# Patient Record
Sex: Female | Born: 1965
Health system: Southern US, Community
[De-identification: ages and names within clinical notes are randomized; demographics above are authoritative.]

## PROBLEM LIST (undated history)

## (undated) DIAGNOSIS — K219 Gastro-esophageal reflux disease without esophagitis: Secondary | ICD-10-CM

## (undated) DIAGNOSIS — IMO0002 Reserved for concepts with insufficient information to code with codable children: Secondary | ICD-10-CM

## (undated) DIAGNOSIS — I10 Essential (primary) hypertension: Secondary | ICD-10-CM

## (undated) HISTORY — PX: ABDOMINAL SURGERY: SHX537

## (undated) HISTORY — PX: CHOLECYSTECTOMY: SHX55

## (undated) HISTORY — PX: PARTIAL HYSTERECTOMY: SHX80

## (undated) HISTORY — PX: APPENDECTOMY: SHX54

## (undated) HISTORY — PX: DILATION AND CURETTAGE OF UTERUS: SHX78

## (undated) HISTORY — PX: NECK SURGERY: SHX720

---

## 2000-04-19 ENCOUNTER — Ambulatory Visit (HOSPITAL_COMMUNITY): Admission: RE | Admit: 2000-04-19 | Discharge: 2000-04-19 | Payer: Self-pay | Admitting: Family Medicine

## 2000-04-19 ENCOUNTER — Encounter: Payer: Self-pay | Admitting: Family Medicine

## 2000-04-20 ENCOUNTER — Encounter (HOSPITAL_COMMUNITY): Admission: RE | Admit: 2000-04-20 | Discharge: 2000-05-20 | Payer: Self-pay | Admitting: Family Medicine

## 2000-05-09 ENCOUNTER — Encounter: Payer: Self-pay | Admitting: Family Medicine

## 2000-05-09 ENCOUNTER — Ambulatory Visit (HOSPITAL_COMMUNITY): Admission: RE | Admit: 2000-05-09 | Discharge: 2000-05-09 | Payer: Self-pay | Admitting: Family Medicine

## 2000-06-20 ENCOUNTER — Observation Stay (HOSPITAL_COMMUNITY): Admission: RE | Admit: 2000-06-20 | Discharge: 2000-06-21 | Payer: Self-pay | Admitting: Neurosurgery

## 2000-07-20 ENCOUNTER — Encounter: Admission: RE | Admit: 2000-07-20 | Discharge: 2000-07-20 | Payer: Self-pay | Admitting: Neurosurgery

## 2000-09-30 ENCOUNTER — Encounter: Admission: RE | Admit: 2000-09-30 | Discharge: 2000-09-30 | Payer: Self-pay | Admitting: Neurosurgery

## 2000-10-11 ENCOUNTER — Encounter (HOSPITAL_COMMUNITY): Admission: RE | Admit: 2000-10-11 | Discharge: 2000-11-10 | Payer: Self-pay | Admitting: Neurosurgery

## 2000-11-09 ENCOUNTER — Ambulatory Visit (HOSPITAL_COMMUNITY): Admission: RE | Admit: 2000-11-09 | Discharge: 2000-11-09 | Payer: Self-pay | Admitting: Neurosurgery

## 2000-11-11 ENCOUNTER — Encounter: Admission: RE | Admit: 2000-11-11 | Discharge: 2000-11-11 | Payer: Self-pay | Admitting: Neurosurgery

## 2000-11-21 ENCOUNTER — Encounter: Admission: RE | Admit: 2000-11-21 | Discharge: 2001-02-19 | Payer: Self-pay | Admitting: Anesthesiology

## 2001-01-10 ENCOUNTER — Encounter: Admission: RE | Admit: 2001-01-10 | Discharge: 2001-02-01 | Payer: Self-pay | Admitting: *Deleted

## 2001-03-03 ENCOUNTER — Encounter: Admission: RE | Admit: 2001-03-03 | Discharge: 2001-06-01 | Payer: Self-pay | Admitting: Anesthesiology

## 2001-06-06 ENCOUNTER — Encounter: Admission: RE | Admit: 2001-06-06 | Discharge: 2001-09-04 | Payer: Self-pay

## 2001-09-25 ENCOUNTER — Encounter: Admission: RE | Admit: 2001-09-25 | Discharge: 2001-10-03 | Payer: Self-pay | Admitting: Anesthesiology

## 2001-10-09 ENCOUNTER — Encounter: Payer: Self-pay | Admitting: Family Medicine

## 2001-10-09 ENCOUNTER — Ambulatory Visit (HOSPITAL_COMMUNITY): Admission: RE | Admit: 2001-10-09 | Discharge: 2001-10-09 | Payer: Self-pay | Admitting: Family Medicine

## 2001-10-20 ENCOUNTER — Encounter: Admission: RE | Admit: 2001-10-20 | Discharge: 2002-01-18 | Payer: Self-pay

## 2001-12-25 ENCOUNTER — Ambulatory Visit (HOSPITAL_COMMUNITY): Admission: RE | Admit: 2001-12-25 | Discharge: 2001-12-25 | Payer: Self-pay

## 2002-01-08 ENCOUNTER — Encounter (HOSPITAL_COMMUNITY): Admission: RE | Admit: 2002-01-08 | Discharge: 2002-02-07 | Payer: Self-pay

## 2002-02-02 ENCOUNTER — Ambulatory Visit (HOSPITAL_COMMUNITY): Admission: RE | Admit: 2002-02-02 | Discharge: 2002-02-02 | Payer: Self-pay | Admitting: Neurosurgery

## 2002-02-09 ENCOUNTER — Encounter: Admission: RE | Admit: 2002-02-09 | Discharge: 2002-02-09 | Payer: Self-pay | Admitting: Neurosurgery

## 2002-02-09 ENCOUNTER — Encounter: Admission: RE | Admit: 2002-02-09 | Discharge: 2002-05-10 | Payer: Self-pay

## 2002-05-10 ENCOUNTER — Encounter
Admission: RE | Admit: 2002-05-10 | Discharge: 2002-08-08 | Payer: Self-pay | Admitting: Physical Medicine & Rehabilitation

## 2002-05-10 ENCOUNTER — Encounter (HOSPITAL_COMMUNITY)
Admission: RE | Admit: 2002-05-10 | Discharge: 2002-06-09 | Payer: Self-pay | Admitting: Physical Medicine & Rehabilitation

## 2002-07-31 ENCOUNTER — Encounter
Admission: RE | Admit: 2002-07-31 | Discharge: 2002-08-07 | Payer: Self-pay | Admitting: Physical Medicine & Rehabilitation

## 2002-08-13 ENCOUNTER — Encounter
Admission: RE | Admit: 2002-08-13 | Discharge: 2002-11-11 | Payer: Self-pay | Admitting: Physical Medicine & Rehabilitation

## 2002-10-29 ENCOUNTER — Encounter
Admission: RE | Admit: 2002-10-29 | Discharge: 2002-10-29 | Payer: Self-pay | Admitting: Physical Medicine & Rehabilitation

## 2002-11-07 ENCOUNTER — Encounter
Admission: RE | Admit: 2002-11-07 | Discharge: 2002-11-27 | Payer: Self-pay | Admitting: Physical Medicine & Rehabilitation

## 2002-11-23 ENCOUNTER — Encounter
Admission: RE | Admit: 2002-11-23 | Discharge: 2003-02-21 | Payer: Self-pay | Admitting: Physical Medicine & Rehabilitation

## 2003-03-10 ENCOUNTER — Emergency Department (HOSPITAL_COMMUNITY): Admission: EM | Admit: 2003-03-10 | Discharge: 2003-03-10 | Payer: Self-pay | Admitting: Emergency Medicine

## 2003-03-13 ENCOUNTER — Inpatient Hospital Stay (HOSPITAL_COMMUNITY): Admission: EM | Admit: 2003-03-13 | Discharge: 2003-03-15 | Payer: Self-pay | Admitting: Emergency Medicine

## 2003-04-05 ENCOUNTER — Encounter
Admission: RE | Admit: 2003-04-05 | Discharge: 2003-07-04 | Payer: Self-pay | Admitting: Physical Medicine & Rehabilitation

## 2003-08-06 ENCOUNTER — Encounter
Admission: RE | Admit: 2003-08-06 | Discharge: 2003-09-12 | Payer: Self-pay | Admitting: Physical Medicine & Rehabilitation

## 2003-09-12 ENCOUNTER — Encounter
Admission: RE | Admit: 2003-09-12 | Discharge: 2003-10-30 | Payer: Self-pay | Admitting: Physical Medicine & Rehabilitation

## 2003-09-16 ENCOUNTER — Ambulatory Visit: Payer: Self-pay | Admitting: Physical Medicine & Rehabilitation

## 2003-10-08 ENCOUNTER — Ambulatory Visit (HOSPITAL_COMMUNITY): Admission: RE | Admit: 2003-10-08 | Discharge: 2003-10-08 | Payer: Self-pay | Admitting: Obstetrics & Gynecology

## 2003-10-30 ENCOUNTER — Encounter
Admission: RE | Admit: 2003-10-30 | Discharge: 2004-01-16 | Payer: Self-pay | Admitting: Physical Medicine & Rehabilitation

## 2003-11-01 ENCOUNTER — Ambulatory Visit: Payer: Self-pay | Admitting: Physical Medicine & Rehabilitation

## 2004-01-16 ENCOUNTER — Encounter
Admission: RE | Admit: 2004-01-16 | Discharge: 2004-04-15 | Payer: Self-pay | Admitting: Physical Medicine & Rehabilitation

## 2004-01-20 ENCOUNTER — Ambulatory Visit: Payer: Self-pay | Admitting: Physical Medicine & Rehabilitation

## 2004-03-30 ENCOUNTER — Ambulatory Visit: Payer: Self-pay | Admitting: Physical Medicine & Rehabilitation

## 2004-04-15 ENCOUNTER — Encounter
Admission: RE | Admit: 2004-04-15 | Discharge: 2004-07-14 | Payer: Self-pay | Admitting: Physical Medicine & Rehabilitation

## 2004-05-25 ENCOUNTER — Ambulatory Visit: Payer: Self-pay | Admitting: Physical Medicine & Rehabilitation

## 2004-07-22 ENCOUNTER — Encounter
Admission: RE | Admit: 2004-07-22 | Discharge: 2004-10-20 | Payer: Self-pay | Admitting: Physical Medicine & Rehabilitation

## 2004-07-22 ENCOUNTER — Ambulatory Visit: Payer: Self-pay | Admitting: Physical Medicine & Rehabilitation

## 2004-09-23 ENCOUNTER — Ambulatory Visit: Payer: Self-pay | Admitting: Physical Medicine & Rehabilitation

## 2004-10-09 ENCOUNTER — Ambulatory Visit (HOSPITAL_COMMUNITY): Admission: RE | Admit: 2004-10-09 | Discharge: 2004-10-09 | Payer: Self-pay | Admitting: Obstetrics & Gynecology

## 2004-10-19 ENCOUNTER — Encounter
Admission: RE | Admit: 2004-10-19 | Discharge: 2005-01-17 | Payer: Self-pay | Admitting: Physical Medicine & Rehabilitation

## 2004-12-07 ENCOUNTER — Ambulatory Visit: Payer: Self-pay | Admitting: Physical Medicine & Rehabilitation

## 2005-01-27 ENCOUNTER — Encounter
Admission: RE | Admit: 2005-01-27 | Discharge: 2005-04-27 | Payer: Self-pay | Admitting: Physical Medicine & Rehabilitation

## 2005-02-25 ENCOUNTER — Ambulatory Visit: Payer: Self-pay | Admitting: Physical Medicine & Rehabilitation

## 2005-02-27 ENCOUNTER — Emergency Department (HOSPITAL_COMMUNITY): Admission: EM | Admit: 2005-02-27 | Discharge: 2005-02-27 | Payer: Self-pay | Admitting: Emergency Medicine

## 2005-04-14 ENCOUNTER — Ambulatory Visit (HOSPITAL_COMMUNITY): Admission: RE | Admit: 2005-04-14 | Discharge: 2005-04-14 | Payer: Self-pay | Admitting: Family Medicine

## 2005-04-22 ENCOUNTER — Ambulatory Visit: Payer: Self-pay | Admitting: Physical Medicine & Rehabilitation

## 2005-05-19 ENCOUNTER — Encounter
Admission: RE | Admit: 2005-05-19 | Discharge: 2005-08-17 | Payer: Self-pay | Admitting: Physical Medicine & Rehabilitation

## 2005-06-16 ENCOUNTER — Ambulatory Visit: Payer: Self-pay | Admitting: Physical Medicine & Rehabilitation

## 2005-08-30 ENCOUNTER — Encounter
Admission: RE | Admit: 2005-08-30 | Discharge: 2005-11-28 | Payer: Self-pay | Admitting: Physical Medicine & Rehabilitation

## 2005-08-30 ENCOUNTER — Ambulatory Visit: Payer: Self-pay | Admitting: Physical Medicine & Rehabilitation

## 2005-09-21 ENCOUNTER — Emergency Department (HOSPITAL_COMMUNITY): Admission: EM | Admit: 2005-09-21 | Discharge: 2005-09-22 | Payer: Self-pay | Admitting: Emergency Medicine

## 2005-10-04 ENCOUNTER — Ambulatory Visit (HOSPITAL_COMMUNITY)
Admission: RE | Admit: 2005-10-04 | Discharge: 2005-10-04 | Payer: Self-pay | Admitting: Physical Medicine & Rehabilitation

## 2005-10-15 ENCOUNTER — Ambulatory Visit (HOSPITAL_COMMUNITY): Admission: RE | Admit: 2005-10-15 | Discharge: 2005-10-15 | Payer: Self-pay | Admitting: Obstetrics & Gynecology

## 2005-11-05 ENCOUNTER — Encounter: Admission: RE | Admit: 2005-11-05 | Discharge: 2005-11-05 | Payer: Self-pay | Admitting: Neurosurgery

## 2005-11-22 ENCOUNTER — Ambulatory Visit: Payer: Self-pay | Admitting: Physical Medicine & Rehabilitation

## 2005-12-20 ENCOUNTER — Encounter
Admission: RE | Admit: 2005-12-20 | Discharge: 2006-03-20 | Payer: Self-pay | Admitting: Physical Medicine & Rehabilitation

## 2006-02-11 ENCOUNTER — Encounter
Admission: RE | Admit: 2006-02-11 | Discharge: 2006-05-12 | Payer: Self-pay | Admitting: Physical Medicine & Rehabilitation

## 2006-02-14 ENCOUNTER — Ambulatory Visit: Payer: Self-pay | Admitting: Physical Medicine & Rehabilitation

## 2006-03-15 ENCOUNTER — Emergency Department (HOSPITAL_COMMUNITY): Admission: EM | Admit: 2006-03-15 | Discharge: 2006-03-15 | Payer: Self-pay | Admitting: Emergency Medicine

## 2006-03-16 ENCOUNTER — Encounter (HOSPITAL_COMMUNITY): Admission: RE | Admit: 2006-03-16 | Discharge: 2006-04-15 | Payer: Self-pay | Admitting: Emergency Medicine

## 2006-04-11 ENCOUNTER — Ambulatory Visit: Payer: Self-pay | Admitting: Physical Medicine & Rehabilitation

## 2006-06-06 ENCOUNTER — Encounter
Admission: RE | Admit: 2006-06-06 | Discharge: 2006-09-04 | Payer: Self-pay | Admitting: Physical Medicine & Rehabilitation

## 2006-06-09 ENCOUNTER — Ambulatory Visit: Payer: Self-pay | Admitting: Physical Medicine & Rehabilitation

## 2006-09-07 ENCOUNTER — Encounter
Admission: RE | Admit: 2006-09-07 | Discharge: 2006-12-06 | Payer: Self-pay | Admitting: Physical Medicine & Rehabilitation

## 2006-09-08 ENCOUNTER — Ambulatory Visit: Payer: Self-pay | Admitting: Physical Medicine & Rehabilitation

## 2006-10-18 ENCOUNTER — Ambulatory Visit (HOSPITAL_COMMUNITY): Admission: RE | Admit: 2006-10-18 | Discharge: 2006-10-18 | Payer: Self-pay | Admitting: Obstetrics & Gynecology

## 2006-11-03 ENCOUNTER — Ambulatory Visit: Payer: Self-pay | Admitting: Physical Medicine & Rehabilitation

## 2006-11-04 ENCOUNTER — Ambulatory Visit (HOSPITAL_COMMUNITY)
Admission: RE | Admit: 2006-11-04 | Discharge: 2006-11-04 | Payer: Self-pay | Admitting: Physical Medicine & Rehabilitation

## 2006-11-08 ENCOUNTER — Other Ambulatory Visit: Admission: RE | Admit: 2006-11-08 | Discharge: 2006-11-08 | Payer: Self-pay | Admitting: Obstetrics & Gynecology

## 2006-12-27 ENCOUNTER — Encounter
Admission: RE | Admit: 2006-12-27 | Discharge: 2007-03-27 | Payer: Self-pay | Admitting: Physical Medicine & Rehabilitation

## 2006-12-27 ENCOUNTER — Ambulatory Visit: Payer: Self-pay | Admitting: Physical Medicine & Rehabilitation

## 2007-02-27 ENCOUNTER — Ambulatory Visit: Payer: Self-pay | Admitting: Physical Medicine & Rehabilitation

## 2007-02-27 ENCOUNTER — Encounter
Admission: RE | Admit: 2007-02-27 | Discharge: 2007-05-28 | Payer: Self-pay | Admitting: Physical Medicine & Rehabilitation

## 2007-04-18 ENCOUNTER — Ambulatory Visit (HOSPITAL_COMMUNITY)
Admission: RE | Admit: 2007-04-18 | Discharge: 2007-04-18 | Payer: Self-pay | Admitting: Physical Medicine & Rehabilitation

## 2007-04-18 ENCOUNTER — Ambulatory Visit: Payer: Self-pay | Admitting: Physical Medicine & Rehabilitation

## 2007-05-16 ENCOUNTER — Ambulatory Visit: Payer: Self-pay | Admitting: Physical Medicine & Rehabilitation

## 2007-06-05 ENCOUNTER — Ambulatory Visit (HOSPITAL_COMMUNITY): Admission: RE | Admit: 2007-06-05 | Discharge: 2007-06-05 | Payer: Self-pay | Admitting: Internal Medicine

## 2007-06-14 ENCOUNTER — Encounter
Admission: RE | Admit: 2007-06-14 | Discharge: 2007-09-12 | Payer: Self-pay | Admitting: Physical Medicine & Rehabilitation

## 2007-06-15 ENCOUNTER — Ambulatory Visit: Payer: Self-pay | Admitting: Physical Medicine & Rehabilitation

## 2007-07-12 ENCOUNTER — Inpatient Hospital Stay (HOSPITAL_COMMUNITY): Admission: RE | Admit: 2007-07-12 | Discharge: 2007-07-13 | Payer: Self-pay | Admitting: Neurosurgery

## 2007-07-25 ENCOUNTER — Ambulatory Visit: Payer: Self-pay | Admitting: Physical Medicine & Rehabilitation

## 2007-08-22 ENCOUNTER — Ambulatory Visit: Payer: Self-pay | Admitting: Physical Medicine & Rehabilitation

## 2007-09-18 ENCOUNTER — Encounter
Admission: RE | Admit: 2007-09-18 | Discharge: 2007-12-17 | Payer: Self-pay | Admitting: Physical Medicine & Rehabilitation

## 2007-09-19 ENCOUNTER — Ambulatory Visit: Payer: Self-pay | Admitting: Physical Medicine & Rehabilitation

## 2007-10-17 ENCOUNTER — Ambulatory Visit: Payer: Self-pay | Admitting: Physical Medicine & Rehabilitation

## 2007-10-20 ENCOUNTER — Ambulatory Visit (HOSPITAL_COMMUNITY): Admission: RE | Admit: 2007-10-20 | Discharge: 2007-10-20 | Payer: Self-pay | Admitting: Obstetrics & Gynecology

## 2007-11-09 ENCOUNTER — Other Ambulatory Visit: Admission: RE | Admit: 2007-11-09 | Discharge: 2007-11-09 | Payer: Self-pay | Admitting: Obstetrics & Gynecology

## 2007-11-14 ENCOUNTER — Ambulatory Visit: Payer: Self-pay | Admitting: Physical Medicine & Rehabilitation

## 2008-02-02 ENCOUNTER — Ambulatory Visit (HOSPITAL_COMMUNITY): Admission: RE | Admit: 2008-02-02 | Discharge: 2008-02-02 | Payer: Self-pay | Admitting: Neurosurgery

## 2008-04-24 ENCOUNTER — Ambulatory Visit (HOSPITAL_COMMUNITY): Payer: Self-pay | Admitting: Oncology

## 2008-04-24 ENCOUNTER — Encounter (HOSPITAL_COMMUNITY): Admission: RE | Admit: 2008-04-24 | Discharge: 2008-05-24 | Payer: Self-pay | Admitting: Oncology

## 2008-06-04 ENCOUNTER — Encounter (HOSPITAL_COMMUNITY): Admission: RE | Admit: 2008-06-04 | Discharge: 2008-07-04 | Payer: Self-pay | Admitting: Oncology

## 2008-06-12 ENCOUNTER — Ambulatory Visit (HOSPITAL_COMMUNITY): Admission: RE | Admit: 2008-06-12 | Discharge: 2008-06-12 | Payer: Self-pay | Admitting: Family Medicine

## 2008-06-12 ENCOUNTER — Encounter: Payer: Self-pay | Admitting: Orthopedic Surgery

## 2008-10-29 ENCOUNTER — Ambulatory Visit (HOSPITAL_COMMUNITY): Admission: RE | Admit: 2008-10-29 | Discharge: 2008-10-29 | Payer: Self-pay | Admitting: Obstetrics & Gynecology

## 2008-12-02 ENCOUNTER — Ambulatory Visit (HOSPITAL_COMMUNITY): Admission: RE | Admit: 2008-12-02 | Discharge: 2008-12-02 | Payer: Self-pay | Admitting: General Surgery

## 2008-12-10 ENCOUNTER — Encounter (HOSPITAL_COMMUNITY): Admission: RE | Admit: 2008-12-10 | Discharge: 2008-12-31 | Payer: Self-pay | Admitting: Oncology

## 2008-12-10 ENCOUNTER — Ambulatory Visit (HOSPITAL_COMMUNITY): Payer: Self-pay | Admitting: Oncology

## 2009-06-19 ENCOUNTER — Encounter (HOSPITAL_COMMUNITY): Admission: RE | Admit: 2009-06-19 | Discharge: 2009-07-19 | Payer: Self-pay | Admitting: Oncology

## 2009-06-19 ENCOUNTER — Ambulatory Visit (HOSPITAL_COMMUNITY): Payer: Self-pay | Admitting: Oncology

## 2009-07-13 ENCOUNTER — Emergency Department (HOSPITAL_COMMUNITY): Admission: EM | Admit: 2009-07-13 | Discharge: 2009-07-13 | Payer: Self-pay | Admitting: Emergency Medicine

## 2009-07-13 ENCOUNTER — Encounter: Payer: Self-pay | Admitting: Orthopedic Surgery

## 2009-07-28 ENCOUNTER — Ambulatory Visit: Payer: Self-pay | Admitting: Orthopedic Surgery

## 2009-07-28 DIAGNOSIS — IMO0002 Reserved for concepts with insufficient information to code with codable children: Secondary | ICD-10-CM | POA: Insufficient documentation

## 2009-07-28 DIAGNOSIS — M7580 Other shoulder lesions, unspecified shoulder: Secondary | ICD-10-CM

## 2009-07-30 ENCOUNTER — Encounter: Payer: Self-pay | Admitting: Orthopedic Surgery

## 2009-08-06 ENCOUNTER — Encounter (HOSPITAL_COMMUNITY): Admission: RE | Admit: 2009-08-06 | Discharge: 2009-09-05 | Payer: Self-pay | Admitting: Orthopedic Surgery

## 2009-09-03 ENCOUNTER — Encounter: Payer: Self-pay | Admitting: Orthopedic Surgery

## 2009-09-09 ENCOUNTER — Encounter (HOSPITAL_COMMUNITY): Admission: RE | Admit: 2009-09-09 | Discharge: 2009-10-09 | Payer: Self-pay | Admitting: Orthopedic Surgery

## 2009-10-16 ENCOUNTER — Encounter: Payer: Self-pay | Admitting: Orthopedic Surgery

## 2009-10-30 ENCOUNTER — Ambulatory Visit (HOSPITAL_COMMUNITY): Admission: RE | Admit: 2009-10-30 | Discharge: 2009-10-30 | Payer: Self-pay | Admitting: Obstetrics & Gynecology

## 2010-02-05 NOTE — Miscellaneous (Signed)
Summary: Physical therapy order  Physical therapy order   Imported By: Cammie Sickle 07/29/2009 11:08:56  _____________________________________________________________________  External Attachment:    Type:   Image     Comment:   External Document

## 2010-02-05 NOTE — Letter (Signed)
Summary: History form  History form   Imported By: Jacklynn Ganong 07/31/2009 09:09:02  _____________________________________________________________________  External Attachment:    Type:   Image     Comment:   External Document

## 2010-02-05 NOTE — Miscellaneous (Signed)
Summary: Discharged from Rehab  Discharged from Rehab   Imported By: Jacklynn Ganong 10/16/2009 08:05:55  _____________________________________________________________________  External Attachment:    Type:   Image     Comment:   External Document

## 2010-02-05 NOTE — Assessment & Plan Note (Signed)
Summary: AP [facility] FOL/UP LT SHOULDER SPRAIN/XR [facility] 07/13/09/MEDICAID/[facility]   Vital Signs:  Patient profile:   45 year old female Height:      67 inches Weight:      152 pounds Pulse rate:   64 / minute Resp:     16 per minute  Vitals Entered By: Fuller Canada MD (July 28, 2009 2:10 PM)  Visit Type:  new patient Referring Provider:  ap er Primary Provider:  Dr. Phillips Odor  CC:  left shoulder pain.  History of Present Illness: I saw [name] in the office today for an initial visit.  She is a 45 years old woman with the complaint of:  left shoulder pain.  07/13/09 xrays left shoulder [facility], pain started after lifting box prior to that day.  FYI 06/12/08 MRI C spine for review.  Meds: Prempro.  07/13/09 received Percocet 5, number 20.    Allergies (verified): 1)  ! Clarise Cruz  Past History:  Past Medical History: na  Past Surgical History: neck d and c falopian tubes removed appendix  Family History: Family History of Diabetes Family History Coronary Heart Disease female < 61 Hx, family, asthma  Social History: Patient is widowed.  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  Review of Systems Constitutional:  Denies weight loss, weight gain, fever, chills, and fatigue. Cardiovascular:  Denies chest pain, palpitations, fainting, and murmurs. Respiratory:  Denies short of breath, wheezing, couch, tightness, pain on inspiration, and snoring . Gastrointestinal:  Denies heartburn, nausea, vomiting, diarrhea, constipation, and blood in your stools. Genitourinary:  Denies frequency, urgency, difficulty urinating, painful urination, flank pain, and bleeding in urine. Neurologic:  Denies numbness, tingling, unsteady gait, dizziness, tremors, and seizure. Musculoskeletal:  Denies joint pain, swelling, instability, stiffness, redness, heat, and muscle pain. Endocrine:  Denies excessive thirst, exessive urination, and heat or cold intolerance. Psychiatric:   Denies nervousness, depression, anxiety, and hallucinations. Skin:  Denies changes in the skin, poor healing, rash, itching, and redness. HEENT:  Denies blurred or double vision, eye pain, redness, and watering. Immunology:  Complains of seasonal allergies; denies sinus problems and allergic to bee stings. Hemoatologic:  Denies easy bleeding and brusing.  Physical Exam  Skin:  intact without lesions or rashes Cervical Nodes:  no significant adenopathy Psych:  alert and cooperative; normal mood and affect; normal attention span and concentration   Shoulder/Elbow Exam  General:    Well-developed, well-nourished, normal body habitus; no deformities, normal grooming.    Skin:    Intact, no scars, lesions, rashes, cafe au lait spots or bruising.    Inspection:    Inspection is normal.    Palpation:    tenderness L-deltoid: and tenderness L-posterior subacromial space tenderness L-deltoid: and tenderness L-infrascapular:Marland Kitchen    Vascular:    Radial, ulnar, brachial, and axillary pulses 2+ and symmetric; capillary refill less than 2 seconds; no evidence of ischemia, clubbing, or cyanosis.    Sensory:    Gross sensation intact in the upper extremities.    Motor:    Normal strength in the upper extremities.    Reflexes:    Normal reflexes in the upper extremities.    Shoulder Exam:    Right:    Inspection:  Normal    Palpation:  Normal    Stability:  stable    FROM     Left:    Inspection:  Abnormal    Palpation:  Abnormal    Stability:  stable    Swelling:  no    Erythema:  no    Pain with resisted EXT ROT     Range of Motion:       Flexion-Active: 150       Flexion-Passive: 180       External Rotation : 50       Interior Rotation : T12  Impingement Sign NEER:    Right negative; Left positive Impingement Sign HAWKINS:    Right negative; Left negative Apprehension Sign:    Right negative; Left negative Sulcus Sign:    Right negative; Left negative AC joint  Adduction Test:    Right negative; Left negative   Impression & Recommendations:  Problem # 1:  SHOULDER STRAIN, LEFT (ICD-840.9) Assessment New The x-rays were done at [facility]. The report and the films have been reviewed. no fracture no joint space narrrowing  Verbal consent obtained/The shoulder was injected with depomedrol 40mg /cc and sensorcaine .25% . There were no complications [left]  Orders: New Patient Level III (38756) Joint Aspirate / Injection, Large (20610) Depo- Medrol 40mg  (J1030)  Medications Added to Medication List This Visit: 1)  Norco 5-325 Mg Tabs (Hydrocodone-acetaminophen) .Marland Kitchen.. 1 q 4 as needed pain Prescriptions: NORCO 5-325 MG TABS (HYDROCODONE-ACETAMINOPHEN) 1 q 4 as needed pain  #56 x 1   Entered and Authorized by:   Fuller Canada MD   Signed by:   Fuller Canada MD on 07/28/2009   Method used:   Print then Give to Patient   RxID:   [id number]

## 2010-02-05 NOTE — Miscellaneous (Signed)
Summary: OT clinical evaluation  OT clinical evaluation   Imported By: Jacklynn Ganong 09/03/2009 08:09:31  _____________________________________________________________________  External Attachment:    Type:   Image     Comment:   External Document

## 2010-03-20 ENCOUNTER — Other Ambulatory Visit (HOSPITAL_COMMUNITY): Payer: Medicare Other

## 2010-03-20 ENCOUNTER — Encounter (HOSPITAL_COMMUNITY): Payer: Medicare Other | Attending: Oncology

## 2010-03-20 DIAGNOSIS — D72829 Elevated white blood cell count, unspecified: Secondary | ICD-10-CM

## 2010-03-22 LAB — CBC
HCT: 35.4 % — ABNORMAL LOW (ref 36.0–46.0)
Hemoglobin: 11.9 g/dL — ABNORMAL LOW (ref 12.0–15.0)
MCHC: 33.7 g/dL (ref 30.0–36.0)
MCV: 91.7 fL (ref 78.0–100.0)
RBC: 3.86 MIL/uL — ABNORMAL LOW (ref 3.87–5.11)
WBC: 11.4 10*3/uL — ABNORMAL HIGH (ref 4.0–10.5)

## 2010-03-22 LAB — DIFFERENTIAL
Basophils Absolute: 0 10*3/uL (ref 0.0–0.1)
Basophils Relative: 0 % (ref 0–1)
Eosinophils Relative: 2 % (ref 0–5)
Lymphocytes Relative: 29 % (ref 12–46)
Monocytes Absolute: 0.6 10*3/uL (ref 0.1–1.0)

## 2010-03-23 ENCOUNTER — Ambulatory Visit (HOSPITAL_COMMUNITY): Payer: Self-pay | Admitting: Oncology

## 2010-03-25 ENCOUNTER — Ambulatory Visit (HOSPITAL_COMMUNITY): Payer: Self-pay | Admitting: Oncology

## 2010-04-07 LAB — CBC
HCT: 37.9 % (ref 36.0–46.0)
Platelets: 215 10*3/uL (ref 150–400)
RDW: 13 % (ref 11.5–15.5)
WBC: 11.4 10*3/uL — ABNORMAL HIGH (ref 4.0–10.5)

## 2010-04-07 LAB — DIFFERENTIAL
Basophils Absolute: 0 10*3/uL (ref 0.0–0.1)
Lymphocytes Relative: 28 % (ref 12–46)
Lymphs Abs: 3.2 10*3/uL (ref 0.7–4.0)
Neutro Abs: 7.2 10*3/uL (ref 1.7–7.7)
Neutrophils Relative %: 63 % (ref 43–77)

## 2010-04-08 LAB — BASIC METABOLIC PANEL
CO2: 25 mEq/L (ref 19–32)
Chloride: 106 mEq/L (ref 96–112)
Creatinine, Ser: 0.99 mg/dL (ref 0.4–1.2)
GFR calc Af Amer: >60 mL/min (ref >60)
Potassium: 4 mEq/L (ref 3.5–5.1)
Sodium: 138 mEq/L (ref 135–145)

## 2010-04-08 LAB — CBC
HCT: 34 % — ABNORMAL LOW (ref 36.0–46.0)
Hemoglobin: 11.4 g/dL — ABNORMAL LOW (ref 12.0–15.0)
MCHC: 33.7 g/dL (ref 30.0–36.0)
MCV: 91.9 fL (ref 78.0–100.0)
RBC: 3.7 MIL/uL — ABNORMAL LOW (ref 3.87–5.11)
WBC: 11.3 10*3/uL — ABNORMAL HIGH (ref 4.0–10.5)

## 2010-04-08 LAB — HCG, QUANTITATIVE, PREGNANCY: hCG, Beta Chain, Quant, S: <2 m[IU]/mL (ref <5)

## 2010-04-13 LAB — CBC
HCT: 35.2 % — ABNORMAL LOW (ref 36.0–46.0)
Hemoglobin: 12.3 g/dL (ref 12.0–15.0)
MCHC: 34.9 g/dL (ref 30.0–36.0)
MCV: 92 fL (ref 78.0–100.0)
Platelets: 193 K/uL (ref 150–400)
RBC: 3.82 MIL/uL — ABNORMAL LOW (ref 3.87–5.11)
RDW: 12.3 % (ref 11.5–15.5)
WBC: 10.8 K/uL — ABNORMAL HIGH (ref 4.0–10.5)

## 2010-04-13 LAB — DIFFERENTIAL
Basophils Absolute: 0 K/uL (ref 0.0–0.1)
Basophils Relative: 0 % (ref 0–1)
Eosinophils Absolute: 0.2 K/uL (ref 0.0–0.7)
Eosinophils Relative: 2 % (ref 0–5)
Lymphocytes Relative: 25 % (ref 12–46)
Lymphs Abs: 2.7 K/uL (ref 0.7–4.0)
Monocytes Absolute: 0.6 K/uL (ref 0.1–1.0)
Monocytes Relative: 6 % (ref 3–12)
Neutro Abs: 7.3 K/uL (ref 1.7–7.7)
Neutrophils Relative %: 67 % (ref 43–77)

## 2010-04-15 LAB — COMPREHENSIVE METABOLIC PANEL
Albumin: 3.6 g/dL (ref 3.5–5.2)
BUN: 10 mg/dL (ref 6–23)
Creatinine, Ser: 0.91 mg/dL (ref 0.4–1.2)
GFR calc Af Amer: >60 mL/min (ref >60)
Total Protein: 6.6 g/dL (ref 6.0–8.3)

## 2010-04-15 LAB — DIFFERENTIAL
Lymphocytes Relative: 22 % (ref 12–46)
Lymphs Abs: 2.4 10*3/uL (ref 0.7–4.0)
Monocytes Absolute: 0.6 10*3/uL (ref 0.1–1.0)
Monocytes Relative: 5 % (ref 3–12)
Neutro Abs: 7.9 10*3/uL — ABNORMAL HIGH (ref 1.7–7.7)

## 2010-04-15 LAB — CBC
HCT: 36.8 % (ref 36.0–46.0)
MCV: 93.4 fL (ref 78.0–100.0)
Platelets: 209 10*3/uL (ref 150–400)
RDW: 12.6 % (ref 11.5–15.5)

## 2010-05-19 NOTE — Assessment & Plan Note (Signed)
The patient has undergone C5-C6 and C6-C7 ACDF per [name] in July  2009.  She had no postoperative complications.  She was placed on  oxycodone postoperatively on top of her MS Contin; however, she is no  longer taking the oxycodone.  She indicates that her pain is currently  in the 6/10 range.  Her neck pain is a bit better.  She continues to  have back pain and left lower extremity pain; described as a sharp,  dull, stabbing, and tingling.  Pain interferes with activity on a 9/10  level.  Relief from meds is fair.  She can walk 10 minutes at a time.  She climbs steps.  She drives.  She needs assistance with meal prep,  household duties, and shopping.   PHYSICAL EXAMINATION:  VITAL SIGNS:  Blood pressure is 120/76, pulse 76,  respirations 18, and O2 sat 95% on room air.  GENERAL:  Well-developed and well-nourished female in no acute distress.  Orientation x3.  Affect is bright and alert.  NEUROLOGIC:  Gait is normal.   Her back has some tenderness to palpation in the lumbar paraspinals.  She has no tenderness in the lower extremities to palpation.  She has  normal range of motion in the lower extremities.  She has normal deep  tendon reflexes in bilateral lower extremities.  She has decreased L4  sensation on the right side versus left side today.  Her neck range of  motion is reduced.   IMPRESSION:  Chronic left S1 radiculopathy.  I may consider her for  spinal cord stimulation trial; however, right now, she is still  recovering from her cervical surgery.   PLAN:  1. I will see her back in 1 month.  We will continue MS Contin 15 mg      sustained release b.i.d.  2. Continue Lyrica 150 mg b.i.d.  3. We will not restart tramadol.  She is doing adequately without it.      Erick Colace, M.D.  Electronically Signed     AEK/MedQ  D:  08/22/2007 14:46:51  T:  08/23/2007 06:25:15  Job #:  [id number]   cc:   Cristi Loron, M.D.  Fax: [phone]

## 2010-05-19 NOTE — Procedures (Signed)
NAME:  [name], [name]               ACCOUNT NO.:  [id number]   MEDICAL RECORD NO.:  [id number]           PATIENT TYPE:   LOCATION:                                 FACILITY:   PHYSICIAN:  Erick Colace, M.D.DATE OF BIRTH:  [birth year]   DATE OF PROCEDURE:  03/20/2007  DATE OF DISCHARGE:                               OPERATIVE REPORT   PROCEDURE:  Left S1 transforaminal lumbar epidural steroid injection  under fluoroscopic guidance.   INDICATIONS:  Lumbosacral radiculitis.  Previous good relief with left  S1 injection.  Pain is only partially responsive to medication  management, including narcotic analgesic medications.   Informed consent was obtained after describing risks and benefits of the  procedure to the patient.  These include bleeding, bruising, infection,  and temporary or permanent paralysis.  She elects proceed and has given  written consent. The patient was placed prone on the fluoroscopy table.  After adequate prep, sterile drape, a 25-gauge, 1-1/2 inch needle was  used to anesthetize the skin and subcutaneous tissue with 1% lidocaine  x2 mL.  Then, a 22-gauge., 3-1/2-inch spinal needle was inserted under  fluoroscopic guidance, starting left S1 foramen.  AP, lateral, and  oblique imaging utilized.  Omnipaque 180 x 0.5 mL demonstrated no  intravascular uptake, good foraminal spread, followed by injection of 1  mL of 40 mg/mL Depo-Medrol and 2 mL of 1% MPF lidocaine.  The patient  tolerated the procedure well.  Pre- and postinjection vitals were  stable.  Postinjection instructions were given.  Follow up in 1 month.  Review effect.      Erick Colace, M.D.  Electronically Signed     AEK/MEDQ  D:  03/20/2007 10:24:50  T:  03/20/2007 11:13:26  Job:  [id number]

## 2010-05-19 NOTE — Assessment & Plan Note (Signed)
HISTORY OF PRESENT ILLNESS:  A 45 year old female with chronic S1  radiculopathy, history of L4-L5 annular tear, facet arthropathy, bulging  disk at L5-S1 causing mild foraminal narrowing, temporary improvement  after S1 transforaminal injections.  She is feeling quite well from her  ACDF of her cervical spine, C5-C6, C6-C7.   MEDICATIONS:  Stable.  MS Contin 15 mg b.i.d.  We did try going up to  Lyrica 150 b.i.d.; however, it really did not do any better at t.i.d.  dose than at b.i.d. dose.   Pain levels are still high about an 8/10.  Her Oswestry disability score  is 40%.   PHYSICAL EXAMINATION:  VITAL SIGNS:  Blood pressure 124/81, pulse 82,  respirations 18, and O2 sat 96% on room air.  GENERAL:  In no acute distress.  Mood and affect appropriate.  Gait is  normal.  EXTREMITIES:  Her lower extremity strength is 5/5 in bilateral hip  flexion, knee extension, and dorsiflexion.  Deep tendon reflexes are  reduced in the right ankle compared to the left.  Straight leg raising  test is negative.  She has good hip, knee, and ankle range of motion.  Extremities without evidence of edema.   IMPRESSION:  Chronic left S1 radiculopathy.  She has decreased left S1  reflex on the left side.  She has decreased sensation in the left S1  dermatome.  She has only had temporary relief with left S1  transforaminal injections.  We will send her for spinal cord stimulator  trial should she check out okay on psychologic evaluation.  I will see  her back in a month to follow up on how she is in the process.  Continue  her current medications as I discussed.  If she has a successful  stimulator implant, she may be able to come off her narcotic analgesics  and be managed with non-narcotic analgesics.      Erick Colace, M.D.  Electronically Signed     AEK/MedQ  D:  10/17/2007 12:15:26  T:  10/18/2007 03:05:45  Job #:  [id number]   cc:   Cristi Loron, M.D.  Fax: [phone]   Herminio Heads, MD  Fax: [phone]

## 2010-05-19 NOTE — Procedures (Signed)
NAME:  [name], [name]               ACCOUNT NO.:  [id number]   MEDICAL RECORD NO.:  [id number]          PATIENT TYPE:  REC   LOCATION:  [facility]                          FACILITY:  [facility]   PHYSICIAN:  Erick Colace, M.D.DATE OF BIRTH:  23-Jan-1965   DATE OF PROCEDURE:  06/15/2007  DATE OF DISCHARGE:                               OPERATIVE REPORT   PROCEDURE:  Bilateral trapezius injection, trigger point injection.   INDICATION:  Myofascial pain syndrome.   Pain is only partially responsive to medication management including  narcotic and analgesics and interferes with self-care mobility causing  headaches as well.   Informed consent was obtained after describing the risks and benefits of  the procedure to the patient.  These include bleeding, bruising, and  infection.  She elects to proceed and has given written consent.  The  patient was placed seated on a chair.  Area was palpated and marked and  prepped with Betadine, entered with 25 gauge 1-1/2 inch needle.  Positive twitch sign was obtained on the left side.  A 1-1/2 mL of 1%  lidocaine was injected.  The same procedure was repeated on the right  side.  No twitch sign on the right.  The patient tolerated the procedure  well.  Post-injection instructions given.      Erick Colace, M.D.  Electronically Signed     AEK/MEDQ  D:  06/15/2007 15:50:00  T:  06/16/2007 06:31:08  Job:  [id number]   cc:   [name]   Dr. Phillips Odor

## 2010-05-19 NOTE — Assessment & Plan Note (Signed)
HISTORY OF PRESENT ILLNESS:  The patient follows up today.  She was last  seen by me 10/31/12008.  Her left lower extremity pain has increased  over the last couple of weeks.  She continues to have coccyx injections  over the interval time period.  She has undergone sacrococcygeal x-rays.  She has had some intermittent right groin pain when she first gets up.  No traumatic events.  She is not seeing any other doctors in the  interval time period.  No surgeries.  Her average pain is 9/10 mainly in  the buttocks, coccyx and left lower extremity.  Her pain is 8 to 9 and  interferes at a 7/10.  Sleep is fair.  She walks 10 minutes at a time.  She climbs steps, she drives.  She has some difficulty with meal prep,  household duties and shopping.   REVIEW OF SYSTEMS:  Review of systems is otherwise negative.   Please see health and history form.   VITAL SIGNS:  __________ /85, pulse 80, respirations 18, O2 sat 92% on  room air.  GENERAL:  No acute distress.  Mood and affect appropriate.  MUSCULOSKELETAL/EXTREMITIES:  Gait is normal except when she first gets  up she feels a catch in the right groin area.  Her hip range of motion  is good, internal and external rotation strength in hip flexion,  adduction is normal.  She has normal lower extremity strength, normal  range of motion in the lower extremities.  Normal deep tendon reflexes.  Back has some tenderness in the lumbosacral junction.  No pain over the  greater trochanters.   IMPRESSION:  1. Lumbo sacral spondylosis with history of chronic left S1      neuritis/radiculitis.  2. History of sacroiliac disorder.  3. Coxodynia.   X-RAYS:  X-rays reviewed.  No fractures or any other abnormalities noted  on x-ray.  Furthermore, her hip joints look intact, only minimal  sclerosis with preservation of joint space.   PLAN:  1. Will continue MS Contin 50 mg b.i.d.  2. Continue Lyrica 150 mg b.i.d.  3. Continue tramadol 100 mg t.i.d.  4.  Will see her back in 1 month.      Erick Colace, M.D.  Electronically Signed     AEK/MedQ  D:  12/05/2006 08:31:13  T:  12/05/2006 08:46:12  Job #:  [id number]

## 2010-05-19 NOTE — Assessment & Plan Note (Signed)
A 45 year old female with chronic S1 radiculitis, history of L4-L5  annular tear facet arthropathy, bulging disk L5-S1 causing mild  foraminal narrowing, temporary improvement with S1 transforaminal  injections.  She had good relief of her neck pain following ACDF of the  cervical spine, not felt to be a great surgical candidate in terms of  her lumbar spine area.  She is undergoing evaluation for spinal cord  stimulator trail.  She has undergone psychological evaluation last week,  the results are pending.   CURRENT MEDICATIONS:  MS Contin 15 mg b.i.d., which is a stable dose.   The Lyrica has become too expensive given the change of her insurance  and therefore she has been trailed on nortriptyline.  Unfortunately, she  reports having had a rash in response to Neurontin so that we cannot try  this.   Pain level still in the 8/10 level.   Oswestry disability index 54%, which is higher than last month.   PHYSICAL EXAMINATION:  GENERAL:  No acute distress.  Mood and affect are  flat.  Gait is normal.  EXTREMITIES:  Her lower extremity strength is 5/5 bilateral hip flexion,  knee extension, and ankle dorsiflexion.  Deep tendon reflexes reduced in  the left ankle compared to the right.  Straight leg raising test is  negative.  Good hip, knee, and ankle range of motion. Extremities are  without edema.   IMPRESSION:  Chronic left S1 radiculitis decreased left S1 reflex.  She  has had temporarily relief with left S1 transforaminal injection.  Spinal cord stimulation trail pending.   We will trial her on higher doses of nortriptyline.  She is on 25  nightly this is not helping with either her sleep or her pain thus far.  We will increase to 50 mg x1 week and then 75 mg the following week.  I  will see her back in 1 month to see how this is doing.   We will continue MS Contin 15 mg p.o. b.i.d.      Erick Colace, M.D.  Electronically Signed     AEK/MedQ  D:  11/14/2007  08:45:34  T:  11/14/2007 23:31:36  Job #:  [id number]   cc:   Cristi Loron, M.D.  Fax: [phone]

## 2010-05-19 NOTE — Assessment & Plan Note (Signed)
[name] follows up today.  She has had left S1 transforaminal epidural  steroid injection with fluoroscopic guidance October 07, 2006.  She has  left S1 chronic radiculitis, which was relieved by this injection.  She  has had coccyx pain.  She has had coccyx injections before, but this is  really not that helpful.  She said her pain level is about 8/10.  Her  sleep is fair.  She climbs steps. She drives.  She has some problems  with meal prep, household duties, and shopping.   MEDICATIONS:  Include:  1. MS Contin 15 mg b.i.d.  2. She is also on Lyrica 150 b.i.d.  3. Tramadol 50 mg 2 p.o. t.i.d.   Pain level is 7 to 8 out of 10.   EXAMINATION:  Blood pressure 124/85.  Pulse 88.  Respirations 18.  Her  O2 saturation 97% on room air.  GENERAL:  In no acute distress.  Mood and affect appropriate.  BACK:  There is no tenderness to palpation.  LOWER EXTREMITY STRENGTH:  Is good.  She has good deep tendon reflexes.  She has tenderness over the coccyx area.  This does not increase with  flexion or extension.  Her lumbar spine range of motion is about 50%  forward flexion and 50% extension without any evidence of toe drag or  knee instability.  Affect is alert.   IMPRESSION:  1. Chronic left S1 radiculopathy improved with epidural steroid      injection.  2. Coccyodynia.  She has sacrococcygeal pain.  We will check x-rays of      this area.  3. Chronic pain management.  We will continue MS Contin at current      dosage 15 mg b.i.d. as will as continue Lyrica 150 b.i.d.  Continue      Tramadol 50 two p.o. t.i.d.      Erick Colace, M.D.  Electronically Signed     AEK/MedQ  D:  11/04/2006 16:10:23  T:  11/05/2006 18:11:27  Job #:  [id number]

## 2010-05-19 NOTE — Op Note (Signed)
NAME:  [name], [name]               ACCOUNT NO.:  [id number]   MEDICAL RECORD NO.:  [id number]          PATIENT TYPE:  OIB   LOCATION:  3534                         FACILITY:  [facility]   PHYSICIAN:  Cristi Loron, M.D.DATE OF BIRTH:  04-21-1965   DATE OF PROCEDURE:  07/12/2007  DATE OF DISCHARGE:                               OPERATIVE REPORT   BRIEF HISTORY:  The patient is a 45 year old black female who has  suffered from neck and arm pain.  She failed medical management.  Worked  up with a cervical MRI which demonstrated she had severe spondylosis at  C5-6, as well as a herniated disk at C6-7.  I discussed the various  treatment with the patient including surgery.  The patient has weighed  the risks, benefits and alternatives of surgery and agreed to proceed  with a C5-6 and C6-7 anterior cervical diskectomy, fusion and plating.   PREOPERATIVE DIAGNOSES:  1. C5-6 and C6-7 spondylosis.  2. Disk degeneration.  3. Herniated nucleus pulposus and stenosis.  4. Cervical radiculopathy/myelopathy.  5. Cervicalgia.   POSTOPERATIVE DIAGNOSES:  1. C5-6 and C6-7 spondylosis.  2. Disk degeneration.  3. Herniated nucleus pulposus and stenosis.  4. Cervical radiculopathy/myelopathy.  5. Cervicalgia.   PROCEDURE:  C5-6 and C6-7 extensive anterior cervical  diskectomy/decompression; C5-6 and C6-7 anterior interbody arthrodesis  with local morselized autograft bone and Actifuse bone graft extender;  insertion of C5-6 and C6-7 interbody prosthesis (Alphatec PEEK interbody  prosthesis) C5-C7 anterior cervical instrumentation with Codman SLIM-LOC  titanium plate and screws.   SURGEON:  Cristi Loron, MD   ASSISTANT:  Hewitt Shorts, MD   ANESTHESIA:  General endotracheal.   ESTIMATED BLOOD LOSS:  100 mL.   SPECIMENS:  None.   DRAINS:  None.   COMPLICATIONS:  None.   DESCRIPTION OF PROCEDURE:  The patient was brought to the operating room  by the Anesthesia team.  General  endotracheal anesthesia was induced.  The patient remained in supine position.  A roll was placed under her  shoulders to place her neck in slight extension.  Anterior cervical  region was then prepared with Betadine scrub and Betadine solution.  Sterile drapes were applied.  I then injected the area to be incised  with Marcaine with epinephrine solution.  I used a scalpel to make a  transverse incision in the patient's left anterior neck.  I used the  Metzenbaum scissors to divide the platysma muscle and then to dissect  medial to the sternocleidomastoid muscle, jugular vein, and carotid  artery.  I carefully dissected down towards the anterior cervical spine  carefully identifying the esophagus retracting it medially.  We  carefully dissected through the scar tissue from the previous surgery.  We used the Kitner swabs to clear the soft tissue from the anterior  cervical spine and exposed the old plate at Z6-1.  It had the typical  thin layer of scar tissue over the plate.  We incised it with the 15  blade scalpel and then exposed the plate and screws and cams.  We then  used the cam plate  interlocking screws to remove the screws and then  removed the plate.   We then used electrocautery to detach the medial border of the longus  colli muscle bilaterally from C5-6 to C6-7 intervertebral disk plate.  We inserted a Caspar self-retaining retractor underneath the longus  colli muscle bilaterally to provide exposure.  We began the  decompression at C5-6.  We incised the C5-6 intervertebral disk with a  15 blade scalpel.  We then performed a partial intervertebral diskectomy  using the pituitary forceps.  We then inserted distraction screws into  the vertebral bodies at C5-C6.  We then distracted the interspace.  We  then used a high-speed drill to decorticate the vertebral endplates at  C5-C6, drilled away the remainder of the C5-6 intervertebral disk,  drilled away some posterior  spondylosis and then to thin out the  posterior longitudinal ligament.  We then incised the ligament with  arachnoid knife and then removed it with a Kerrison punch undercutting  the vertebral endplates of C5-6 decompressing the thecal sac.  We then  performed foraminotomies about bilateral C6 nerve root completing the  decompression at this level.   We then repeated this procedure in an analogous fashion at C6-7  decompressing the thecal sac at the bilateral C7 nerve roots.   Having completing the decompression, we now turned our attention to the  arthrodesis.  We used trial spacers and determined to use a 5-mm medium  interbody prosthesis at C6-7 and a 6-mm medium interbody prosthesis at  C5-6.  We prefilled these prostheses with a combination of local  autograft bone that we obtained during the decompression, as well as  Actifuse bone graft extender.  We then inserted prosthesis into  distracted interspaces.  We then removed the distraction screws.  There  was a good snug fit of the prosthesis at both levels.   We now turned our attention to the anterior spinal instrumentation.  We  used the high-speed drill to remove some ventral spondylosis from the  vertebral endplates of C5-6 and C6-7 so the endplate will lay down flat.  We then selected appropriate length Codman SLIM LOC anterior cervical  plate and laid it along the anterior aspect of the vertebrae from C5 to  C7.  We used old holes at C5, we drilled two 12 mm holes at C6 and C7,  and one 12 mm hole at C5.  We then secured the plate to the vertebral  bodies by placing two 12 mm screws at C5, C6, C7. Again after taking  intraoperative radiograph, demonstrated good position of upper plate  screws interbody prosthesis.  The remainder of the construct looked good  in vivo.  We therefore secured the screws and plate by locking each cam.  We then obtained hemostasis using bipolar electrocautery.  We irrigated  the wound out with  bacitracin solution.  We then removed the retractor.  We inspected the esophagus for any damage and there was none apparent.  We then reapproximated the patient's platysma muscle with interrupted 3-  0 Vicryl suture, subcutaneous tissue with interrupted 3-0 Vicryl suture  and the skin with Steri-Strips and Benzoin.  The wound was then coated  with bacitracin  ointment and sterile dressing was applied.  The drapes were removed.  The patient was subsequently extubated by the Anesthesia team and  transported to the Postanesthesia Care Unit in stable condition.  All  sponge, instrument and needle counts were correct at the end of the  case.  Cristi Loron, M.D.  Electronically Signed     JDJ/MEDQ  D:  07/12/2007  T:  07/13/2007  Job:  [id number]

## 2010-05-19 NOTE — Assessment & Plan Note (Signed)
HISTORY OF PRESENT ILLNESS:  [name] is a 45 year old female I  have been seeing for several years.  She has a history of chronic left  S1 radiculopathy.  She has a history of L4-L5 annular tear, facet  arthropathy, and bulging disc at L5-S1 causing mild biforaminal  narrowing.  She has responded temporarily to left S1 transforaminal  epidural steroid injections but no prolonged response.  More recently,  she has undergone ACDF at C5-C6, C6-C7 at the beginning of July 2009 per  Dr. Tressie Stalker.  She has had a good recovery from her neck surgery  and is doing quite well in regards to that.   MEDICATIONS:  Her medications have been fairly stable with:  1. Lyrica 150 mg b.i.d.  2. MS Contin 15 mg b.i.d.   Her pain is up at 8/10 level, and this is mainly pain going down the  right lower extremity.  She can walk five minutes at a time.  She can  climbs steps.  She drives.   PHYSICAL EXAMINATION:  VITAL SIGNS:  Her blood pressure is 117/72, pulse  89, respirations 18, and O2 saturations 98% on room air.  GENERAL:  In no acute distress.  Mood and affect appropriate, and her  affect is alert.  EXTREMITIES:  Without edema.  Her gait is normal.  She has decreased  sensation in the S1 dermatome on the left side only.  She has decreased  left S1 reflex on her left side as well.  She has normal strength in  bilateral upper and lower extremities.  She is able to toe walk and heel  walk as well.   IMPRESSION:  1. Chronic left S1 radiculopathy.  I have given her the information on      spinal cord stimulation trial.  Referred to Dr. Nickola Major to      talk to her more about that.  2. She is to follow [name] in regards to her anterior cervical      diskectomy and fusion, but is doing quite well from that respect.      We will continue her current medications.  I will see her back in a      couple of months.      Erick Colace, M.D.  Electronically Signed     AEK/MedQ  D:  09/19/2007 16:05:43  T:  09/20/2007 08:20:48  Job #:  [id number]   cc:   Cristi Loron, M.D.  Fax: [phone]   Herminio Heads, MD  Fax: [phone]

## 2010-05-19 NOTE — Procedures (Signed)
NAME:  [name], [name]               ACCOUNT NO.:  [id number]   MEDICAL RECORD NO.:  [id number]          PATIENT TYPE:  REC   LOCATION:  TPC                          FACILITY:  [facility]   PHYSICIAN:  Erick Colace, M.D.DATE OF BIRTH:  1965-05-15   DATE OF PROCEDURE:  DATE OF DISCHARGE:                               OPERATIVE REPORT   PROCEDURE:  Left S1 transforaminal lumbar epidural steroid injection  under fluoroscopic guidance.   INDICATIONS:  Left S1 radiculitis only partially relieved by medication  management, previously relieved by epidural injections.   INFORMED CONSENT:  Obtained after describing the risks and benefits of  the procedure to the patient.  These include bleeding, bruising,  infection, loss of bowel or bladder function, temporary or permanent  paralysis; he elects to proceed, and has given written consent.  The  patient placed prone on the fluoroscopy table.  Betadine prep and  sterile drape, a 25-gauge 1-1/2-inch needle was used in the skin and  subcu tissue.  Then 1% lidocaine times 10 mL.   Then a 22-gauge, 3-1/2-inch spinal needle was inserted in the left S1  foramen Omnipaque 180 x 0.5 mL demonstrated no intravascular uptake.  Then a solution obtaining 1 mL of 40 mg/mL Depo-Medrol and 2 mL of 2%  MPF lidocaine were injected.  The patient tolerated the procedure well.  Pre-and-post injection vitals stable.      Erick Colace, M.D.  Electronically Signed     AEK/MEDQ  D:  10/06/2006 15:46:02  T:  10/07/2006 10:48:17  Job:  [id number]

## 2010-05-19 NOTE — Procedures (Signed)
NAME:  [name], [name]               ACCOUNT NO.:  [id number]   MEDICAL RECORD NO.:  [id number]          PATIENT TYPE:  REC   LOCATION:  TPC                          FACILITY:  [facility]   PHYSICIAN:  Erick Colace, M.D.DATE OF BIRTH:  Dec 21, 1965   DATE OF PROCEDURE:  DATE OF DISCHARGE:                               OPERATIVE REPORT   DATE 07/14/06   PROCEDURE:  Right sacroiliac joint injection under fluoroscopic  guidance.   INDICATIONS:  Right sacroiliac pain.  She has sacroiliac disorder only  partially responsive to medication management including narcotic  analgesics.   DESCRIPTION OF PROCEDURE:  Informed consent was obtained describing the  risks and benefits of the procedure to the patient.  These include  bleeding, bruising, infection, loss of bowel and bladder function,  temporary permanent/paralysis and she elected to proceed and has given  informed consent.  The patient was placed prone on the fluoroscopy  table.  Betadine prep, sterile drape.  A 25 gauge 1 and 1/2 inch needle  was used to anesthetize the skin and subcu tissue.  1% lidocaine x2 cc  then 25-gauge 3-inch spinal needle was inserted under fluoroscopic  guidance into the SI joint on the right side.  Omnipaque 180 x0.5 cc  demonstrated no intravascular uptake, good arthrogram followed by  injection of 1 cc of 2% MPF lidocaine plus 0.5 cc of 40 mg per cc Depo-  Medrol.  The patient tolerated the procedure well.  The patient had pre  injection pain level 9/10.  Post injection 7/10.  Post injection  instructions given.  Pre and post injection vitals stable.      Erick Colace, M.D.  Electronically Signed     AEK/MEDQ  D:  07/14/2006 14:45:03  T:  07/15/2006 10:00:23  Job:  [id number]

## 2010-05-19 NOTE — Assessment & Plan Note (Signed)
[name] returns today.  She has had a left S1 transforaminal lumbar  epidural steroid injection under fluoroscopic guidance dated March 20, 2007.  She had no significant relief from this injection.  With prior  injections, she has had relief with S1 injections.  She complains of  both back pain, hip pain and pain going down all the way to her foot.  She has had no numbness or __________ reported.  She does have pain that  increases with prolonged sitting and walking as well as bending.  Sleep  is fair.  Pain is rated at 8-9/10, sharp, burning, stabbing and some  tingling discomfort.   Her current medications include the following:  1. Morphine sulfate, controlled release, 15 mg b.i.d.  2. Tramadol 100 mg t.i.d.   She had right sacroiliac injection July 14, 2006, and went from pre-  injection 9/10, post-injection 7/10, no significant coccygeal pain as  she has had in the past.  She has had lumbar facet medial branch block  at L4-5, L5-S1 facets which was not helpful for her pain levels.   She sometimes uses a cane on bad days.  She continues to drive.  She has  some difficulty with steps.  She needs some assistance with meal prep,  household duties, and shopping.   PHYSICAL EXAMINATION:  VITAL SIGNS:  Her blood pressure is 129/73, pulse  83, respirations 16, O2 saturation 99% on room air.  GENERAL:  In no acute distress.  Mood and affect are appropriate.  BACK/HIPS:  Her back has mild tenderness with palpation of  lumboparaspinals.  She has no pain over the hip greater trochanters.  She does have some pain with internal and external rotation of left  greater than right hip.  She also has pain with FABER's maneuver mainly  in the hip area with decreased range of motion and external rotation of  the left hip compared to the right hip.  NEUROLOGIC:  Motor strength is 5/5.  Hip flexion, knee extension, ankle  dorsiflexion and deep tendon reflexes are normal.  Gait is only mildly  antalgic favoring left lower extremity compared to the right side.   IMPRESSION:  Left lower extremity pain involving hip, buttock, thigh and  leg, no significant improvement after sacroiliac injections, after facet  injections as well as S1 transforaminal.  At this point, I would like to  look into the hip joint given her range of motion abnormality on  examination today.  We will check PA pelvis, look at bilateral hips and  go from there.  Should she have significant arthritis, I would like to  send her to orthopedic surgery to look at her.   I will see her back in a couple of weeks after we get the imaging  studies.   PLAN:  Continue MS Contin 15 mg controlled release b.i.d., tramadol 100  p.o. t.i.d.      Erick Colace, M.D.  Electronically Signed     AEK/MedQ  D:  04/18/2007 14:19:36  T:  04/18/2007 15:02:48  Job #:  [id number]

## 2010-05-19 NOTE — Assessment & Plan Note (Signed)
Ms. Peplinski returns today, a 45 year old female with cervical  postlaminectomy syndrome, lumbar spondylosis, lumbosacral neuritis.  Her  main complaint is neck pain, left greater than right shoulder pain, as  well as left upper extremity numbness.  She has reportedly seen Dr.  [name] in regards to her neck pain and she discussed surgery with him,  but did not feel like she wanted to go through with the surgery at least  immediately and is continuing to think about it.  She does have left  upper extremity numbness, but no weakness.  She complaints of headache,  which seems to be emanating from her neck.  She has had no visual  changes.  She has had no changes in her mentation.  She last saw me on  May 16, 2007.  She has had an MRI of the cervical spine dated June 05, 2007, ordered per Dr. Sherwood Gambler.  She had a mild broad-based posterior  annular bulge at C3-C4 with evidence of C4-C5 fusion.  She did have a  mild central disk at C5-C6 without evidence of neural compromise  centrally, however, marked foraminal stenosis at left C5-C6.  At C6-C7,  she has minimal narrowing of the left lateral recess and foramen.   Her pain levels, she grades at about 9/10 mainly with her head, pain  with general activity at 8/10.  She drives.  She climbs steps.  She has  some difficulty with certain meal prep, household duties, and shopping.  There is numbness and tingling, primarily in the left middle finger.   MEDICATIONS:  1. MS Contin 15 b.i.d.  2. Lyrica 150 b.i.d.  3. Tramadol 100 t.i.d.   PHYSICAL EXAMINATION:  Blood pressure is 118/73, pulse 66, respiration  18, oxygen sat 99% on room air.  In general, no acute distress.  Orientation x3.  Affect is depressed.  Gait is normal.  She has full  strength in bilateral upper and lower extremities.  Normal range of  motion in bilateral upper and lower extremities.  Normal deep tendon  reflexes in bilateral upper and lower extremity.  Her sensation is equal  at  C5 dermatome, reduced left C6-C7 and C8 dermatomes compared to the  right side.  She has negative Phalen maneuver.   We reviewed her cervical MRI, which demonstrated left-sided stenosis at  C5-6, however, nothing low in C8 or in the C7-T1 area.   IMPRESSION:  1. Neck pain as well as left upper extremity pain.  Does have      foraminal stenosis definitely on the left side.  However, this      involves not only C6 root, but also C7 and C8.  Question whether      she may have an ulnar neuropathy given her symptomatology or      perhaps a double crush.  We would like to get an EMG/NCV of left      upper extremity.  2. We will continue her MS Contin 15 b.i.d.  3. Continue tramadol 100 t.i.d.  4. Continue Lyrica 150 b.i.d.  May consider increase.  5. Trigger point injections of her traps, given that she has      tenderness in bilateral upper traps, and this may be contributing      to headaches.  It does appear her headache pain is coming from her      neck.      Erick Colace, M.D.  Electronically Signed     AEK/MedQ  D:  06/15/2007 15:59:17  T:  06/16/2007 06:08:58  Job #:  [id number]   cc:   Cristi Loron, M.D.  Fax: [phone]   Dr. [facility]

## 2010-05-19 NOTE — Assessment & Plan Note (Signed)
Ms. Mazurkiewicz returns today.  She is a 45 year old female with cervical  postlaminectomy syndrome, lumbar spondylosis, lumbosacral neuritis, and  radiculitis.  She has been managed with stable dosages of morphine  sulfate controlled-release 15 mg b.i.d., and tramadol 100 mg t.i.d.   She has had a new problem starting yesterday, coldness feeling in her  right arm, some weakness, pain going from her shoulder into her hands.   She has had no visual changes, no lower extremity weakness.  She has had  some headache.  Rates her pain an 8/10.  Sleep is fair.  She has been  able to drive.   She has no clear numbness in the hands, but she does have some hand  pain.   PHYSICAL EXAMINATION:  VITAL SIGNS:  Blood pressure 133/74, pulse 70,  respirations 22, O2 saturation 97% on room air.  NEUROLOGIC:  Cranial nerves II-XII are intact.  She has 4/5 strength,  with some guarding in the right upper extremity and deltoid, biceps, and  grip, 5/5 on the left upper extremity.  Lower extremity strength is 5/5.  Deep tendon reflexes are 2+ over biceps, triceps, brachial radialis,  knee, and ankle.  She has negative Hoffmann's bilaterally in the upper  extremities.  Shoulder impingement testing was negative.  She had some  tenderness on palpation in the upper trapezius area, shoulder area,  elbow area, low back area, hip and knee area bilaterally.   IMPRESSION:  History of cervical postlamintectomy syndrome.  Difficult  to state whether her current symptomatology is related to a new cervical  disc.  She has some other symptomatology such as the whole arm being  numb or feeling cold.  That makes me wonder, could this be a TIA or a  small cortical lesion, and for that reason I have asked her to go to the  ED to get this checked out a bit further, as the [facility] ED is just  across the street.   If this checks out negative, we will see her back in followup and see  how she is doing.  We may need to re-image  her cervical spine.   For now, we will continue MS Contin 15 b.i.d. and tramadol 100 t.i.d.  We may need to add Neurontin once we have a better handle on the  situation.      Erick Colace, M.D.  Electronically Signed     AEK/MedQ  D:  05/16/2007 17:35:08  T:  05/16/2007 18:46:35  Job #:  [id number]

## 2010-05-19 NOTE — Assessment & Plan Note (Signed)
[name] returns today.  She has had C5-C6, C6-C7 ACDF per [name]  since I last saw her on June 15, 2007.  The patient indicates she had no  postsurgical complications.  She was placed on oxycodone, which she was  told to take on top of her MS Contin.  In the postoperative period of  time, she was told to stop her tramadol.  She indicates that her average  pain is currently in the 7/10 range.  The pain is in the 8/10,  interfering with activities.  Sleep is fair.  Pain is increased with  walking, bending, and sitting.  She is driving again.   SOCIAL HISTORY:  Widowed.  No changes in this regard.   PHYSICAL EXAMINATION:  Blood pressure is 130/79, pulse 69, respirations  20, and O2 sat 97% on room air.  On examination, she has full strength  in bilateral deltoid, biceps, and triceps grip.  She has full strength  in the bilateral lower extremities.  She does have a decreased left S1  reflex and some decreased sensory in the left S1 distribution.  She  continues to have pain down that side as well.   IMPRESSION:  1. C5-C6 spondylosis and C6-C7 herniated disk status, post anterior      cervical diskectomy and fusion, performed by [name] on July 12, 2007.  It feels like she is doing better in terms of her upper      extremity symptomatology as well as her neck pain.  Although, she      still continues to have some postoperative pain as well.  2. Chronic left S1 radiculopathy.  We discussed the procedures      including spinal cord stimulator trial, which she may consider once      she has covered more from the cervical surgery.   She will continue her MS Contin with the oxycodone for breakthrough  medicine.   I will see her back in about a month's time.  At that time, she should  be off the oxycodone and back on the tramadol as well as continue on her  MS Contin.      Erick Colace, M.D.  Electronically Signed     AEK/MedQ  D:  07/25/2007 16:06:22  T:   07/26/2007 11:23:06  Job #:  [id number]

## 2010-05-19 NOTE — Assessment & Plan Note (Signed)
REASON FOR VISIT:  Low back pain.   A 45 year old female with long history of low back pain.  She has had  some coccyx pain but this has improved.   She has had no new problems in the interval time period other than for  insurance reasons could not buy her morphine and now is on a new  insurance and restarting.  Her average pain is 9/10 with activity,  relationship with other  enjoyment of life, her pain interferes at a  7/10 level.  Described as tingling, dull, stabbing, burning.  She states  pain is worse with both walking, bending, standing as well as sitting  and inactivity. Relief from meds is fair at a 5/10 level.  She can walk  5-10 minutes.  She can climb steps.   She needs some assistance with meal prep, household duties and shopping.   REVIEW OF SYSTEMS:  Positive for numbness, tingling, trouble walking,  spasms.  Blood pressure is 126/75, pulse 90, respirations 18, O2 sat 96%  on room air.  GENERAL:  No acute distress.  Orientation x3.  Affect  bright.  Gait is normal.  She is able to do heel walk, toe walk for a  few steps.  She has normal strength in the lower extremities.  She has  1+ deep tendon reflexes bilateral knees and ankles now.  She has some  tenderness at the lumbosacral  junction, the paraspinal muscles, no pain  over the greater trochanters.  She has good range of motion in the hips,  knees and ankles.  No pain in the coccygeal region.   IMPRESSION:  Normo sacral spondylosis.  History of chronic left S1  neuritis, radiculitis.  Her radicular symptoms are doing okay at this  point.   PLAN:  1. Continue Lyrica 150 b.i.d.  2. Continue Tramadol 50 mg 2 p.o. t.i.d.  3. Continue MS Contin 15 mg p.o. b.i.d.  4. I will see her back in 2 months with nursing visit in 1 month.      Erick Colace, M.D.  Electronically Signed     AEK/MedQ  D:  01/02/2007 12:30:33  T:  01/02/2007 12:48:02  Job #:  [id number]   cc:   Cristi Loron, M.D.  Fax:  [phone]

## 2010-05-19 NOTE — Assessment & Plan Note (Signed)
[name] returns today.  She is a 45 year old female with chronic left  S1 radiculitis as well as axial back pain.  She has been relatively well  controlled on her current medication regimen in terms of her low back  pain.  However, she has had recurrence of left lower extremity pain  going down to the foot described as burning, tingling.  She had her last  transforaminal injection done October 06, 2006.   CURRENT MEDICATIONS:  1. MS Contin 15 mg p.o. b.i.d.  2. Lyrica 150 b.i.d.  3. Tramadol 100 mg t.i.d.   FAMILY HISTORY:  Independent with self care.  Still needs some help with  meal prep, household duties, shopping.  On disability since December  2007.   REVIEW OF SYSTEMS:  Positive for trouble walking due to left lower  extremity pain rated as 8/10.  Sleep is fair.  Pain relief with  medications about 50%.   SOCIAL HISTORY:  She is recently widowed. Husband died from multiple  myeloma and congestive heart failure about two months ago, and this is  rather sudden for her.   Her blood pressure is 112/70, pulse 73, respiratory rate 18.  O2  saturation 98% on room air.  GENERAL:  No acute distress.  Her back has tenderness to palpation lumbosacral junction.  Lumbar spine  range of motion 50% forward flexion extension.  Hip range of motion is  good.  Knee flexion extension is normal range.  Ankle range of motion is  normal.  She has normal strength bilateral hip flexors, knee extensors,  ankle dorsiflexors.  She is able to toe walk, heel walk.  She has normal  deep tendon reflexes.   IMPRESSION:  1. History of chronic and now recurrent left S1 radiculitis.  2. History of sacroiliac disorder, axial back pain  3. Coxodynia improved.   PLAN:  1. Continue MS Contin 15 mg b.i.d.  2. Continue Tramadol 100 t.i.d.  3. Patient states that with the death of her husband, her prescription      drug coverage is now terminated and she will have to see how much      the Lyrica is.  May  consider switching her.  She did not do well on      Neurontin before because of itching.  Consider other possibilities      such as tricyclics.  4. Set her up for left transforaminal lumbar epidural steroid      injection under fluoroscopic guidance.      Erick Colace, M.D.  Electronically Signed    AEK/MedQ  D:  02/28/2007 13:45:10  T:  02/28/2007 19:00:01  Job #:  [id number]   cc:   Cristi Loron, M.D.  Fax: [phone]

## 2010-05-19 NOTE — Assessment & Plan Note (Signed)
The patient last seen by me July 14, 2006.  She had a right sacroiliac  injection, which really was not helpful as it had been in the past, and  her major compliant today is persistent coccyx pain.  She has also had  some recurrence of her left lower extremity pain.  Her pain is about 9  out of 10 and interferes with activity at an 8 out of 10 level.  Sleep  is fair.  Relief from meds is fair.  She walks without assistance six to  eight minutes at a time, climbs steps, drives, needs assistance with  meal prep, household duties, shopping.   REVIEW OF SYSTEMS:  Numbness and tingling in the left lower extremity,  trouble walking secondary to pain in her leg.   PHYSICAL EXAMINATION:  VITAL SIGNS:  Blood pressure 133/78, pulse 81,  respiratory rate 18, O2 SAT 98% on room air.   The patient has no PSIS tenderness over the lumbar paraspinal's.  She  does have tenderness right over the coccyx.  Her pain with forward  flexion and extension does not exacerbate this pain.  She does have  normal deep tendon reflexes and strength in the lower extremities.   IMPRESSION:  1. Coccydynia.  2. Left S1 radiculitis, recurrent.   PLAN:  1. Continue Tramadol two p.o. t.i.d.  2. Continue MS-Contin 15 mg p.o. b.i.d.  3. Continue Lyrica 150 b.i.d.   I will see her back in about one month for the S1 transforaminal  injection.      Erick Colace, M.D.  Electronically Signed     AEK/MedQ  D:  09/08/2006 15:41:31  T:  09/08/2006 18:45:55  Job #:  [id number]   cc:   Cristi Loron, M.D.  Fax: [phone]

## 2010-05-22 NOTE — Op Note (Signed)
NAME:  [name], [name] NO.:  [id number]   MEDICAL RECORD NO.:  [id number]                   PATIENT TYPE:  INP   LOCATION:  A320                                 FACILITY:  [facility]   PHYSICIAN:  Barbaraann Barthel, M.D.              DATE OF BIRTH:  Jul 10, 1965   DATE OF PROCEDURE:  03/14/2003  DATE OF DISCHARGE:                                 OPERATIVE REPORT   SURGEON:  Barbaraann Barthel, M.D.   PREOPERATIVE DIAGNOSES:  Cholecystitis, cholelithiasis.   POSTOPERATIVE DIAGNOSES:  Cholecystitis, cholelithiasis.   PROCEDURE:  Laparoscopic cholecystectomy.   SPECIMENS:  Gallbladder with stones.   Note this is a 45 year old black female who was admitted to the medical  service with right upper quadrant pain and nausea for at least a week or so.  Surgery was consulted, she was noted to have multiple stones within the  gallbladder, mildly elevated liver function studies with normal amylase.  Repeat liver function studies showed normal liver function studies the day  of surgery.  She also had some hypokalemia which was corrected  preoperatively. She was taken to surgery after discussing this procedure in  detail with her and her husband. We discussed the complications not limited  to but including bleeding, infection, damage to bile ducts, perforation of  organs and transitory diarrhea.  Informed consent was obtained.   GROSS OPERATIVE FINDINGS:  The patient had a thickened gallbladder with some  adhesions about it.  A short cystic duct cholangiogram was not performed.  The rest of the right upper quadrant appeared normal laparoscopically.   TECHNIQUE:  The patient was placed in a supine position and after the  adequate administration of general anesthesia by endotracheal intubation, a  Foley catheter was aseptically inserted and the patient's abdomen was  prepped with Betadine solution and draped in the usual manner. With the  patient in Trendelenburg, a  periumbilical incision was carried over the  superior aspect of the umbilicus and the fascia was grasped with a sharp  towel clip and a Veress needle was inserted and confirmed in position with a  saline drop test. The abdomen was then insufflated with approximately 3.2  liters of CO2 and we placed an 11 mm [place] surgical cannula within the  umbilicus incision using the Visiport technique. We also then placed under  direct vision an 11 mm cannula in the epigastrium and two 5 mm cannulas in  the right upper quadrant laterally.  The gallbladder was grasped, its  adhesions were taken down, the cystic duct was clearly visualized, triply  silver clipped and divided as was the cystic artery. The gallbladder was  then removed uneventfully from the liver bed using the hook cautery device.  Bleeding was controlled using hook cautery device and after removing the  gallbladder using the endosac device, I elected to leave a piece of Surgicel  within the liver bed and a Jackson-Pratt drain within  the liver bed. Prior  to closure, all sponge, needle and instrument counts were found to be  correct. Estimated blood loss was minimal.  The patient received 600 mL of  crystalloids intraoperatively. There were no complications.      ___________________________________________                                            Barbaraann Barthel, M.D.   WB/MEDQ  D:  03/14/2003  T:  03/14/2003  Job:  [id number]

## 2010-05-22 NOTE — Consult Note (Signed)
[facility]  Patient:    Rachel, [name] Visit Number: [id number] MRN: [id number]          Service Type: PMG Location: [facility] Attending Physician:  Sondra Come Dictated by:   Sondra Come, D.O. Proc. Date: 06/08/01 Admit Date:  06/06/2001   CC:         [name]   Consultation Report  REASON FOR CONSULTATION:  [name] returns to the clinic today for reevaluation. In the interim, she has seen [name] who has repeated the MRI of her lumbar spine which I reviewed today. She continues to complain of mainly low back pain today radiating to her left anterior medial aspect of her thigh with associated numbness an paresthesias intermittently. She occasionally has pain radiating into her right lower extremity in the same distribution. She also has some mild neck pain without any radicular symptoms at this time. She has had a positive response from cervical facet blocks and we have been discussing proceeding with radiofrequency neural oblation. The patient is somewhat hesitant to move in this direction and we discussed this as well. I explained the procedure with its associated risks, benefits, limitations and alternatives. We also discussed further options to help with the patients low back pain. She continues with a home exercise program and a walking program. Her pain today is an 8/10 on a subjective scale. She continues to work 24 hours per week. She denies any bowel and bladder dysfunction. She admitted to a few occasions of low back pain increasing with sneezing. She continues taking Bextra, Ultram and Norco very sparingly. She does not need refills. I reviewed her health and history form and 14 point review of systems.  PHYSICAL EXAMINATION:  Reveals a healthy female in no acute distress.  Back reveals a level pelvis without scoliosis. There is minimal tenderness to palpation bilateral lumbar paraspinals. Range of motion is limited in  flexion and extension secondary to pain primarily on extension today. Manual muscle testing is 5/5 bilateral upper and lower extremities. Sensory examination reveals decreased light touch in the left anterior thigh and leg. Muscle stretch reflexes are symmetric bilateral upper and lower extremities. Straight leg raise is negative bilaterally.  MRI lumbar spine was reviewed. The patient has disk protrusions at L4-5 and L5-S1 with significant disk space narrowing at L5-S1 and disk dehydration at both levels. There is also noted facet arthropathy and mild left neuroforaminal stenosis at L4-5 with possible annular tear. There is facet arthropathy at L5-S1 as well.  IMPRESSION:  1. Low back pain with bilateral lower extremity radicular symptoms primarily     in an L4 distribution. The patient has degenerative disk disease of the     lumbar spine at L4-5 and L5-S1 primarily.  2. Cervicalgia status post C4-5 anterior cervical diskectomy with fusion.     The patient has a significant component of facet syndrome given her     positive response to cervical facet blocks.  PLAN:  1. I had a thorough discussion with [name] regarding treatment options.     This was an extensive consultation greater than 25 minutes. Based on the     patients MRI findings and her symptoms, it is reasonable to proceed with     a lumbar epidural steroid injection therapeutically. I discussed this at     length with [name]. If she does not get any significant benefit from     the lumbar epidural steroid injection would consider lumbar facet  blocks     versus discogram.  2. Consider radiofrequency neural oblation cervical facets per Dr. Stevphen Rochester.     The patient wishes to put this on hold for now.  3. Continue Bextra 20 mg b.i.d.  4. Continue Ultram as needed.  5. Continue Norco as needed. The patient is using this appropriately.  6. The patient will return to the clinic for lumbar epidural steroid      injection.  The patient was educated on the above findings and recommendations and understands. No barriers to communication. Dictated by:   Sondra Come, D.O.  Attending Physician:  Sondra Come DD:  06/08/01 TD:  06/10/01 Job: [id number]

## 2010-05-22 NOTE — Consult Note (Signed)
[facility]  Patient:    [name], [name] Visit Number: [id number] MRN: [id number]          Service Type: PMG Location: [facility] Attending Physician:  Rolly Salter Dictated by:   Sondra Come, D.O. Proc. Date: 01/09/01 Admit Date:  11/21/2000   CC:         Cristi Loron, M.D.   Consultation Report  HISTORY OF PRESENT ILLNESS:  Ms. Wingler returns to clinic today as scheduled for reevaluation.  She was last seen on December 26, 2000 and underwent repeat trigger point injections into her right upper trapezius, levator scapula, infraspinatus, and bilateral thoracic paraspinal muscles.  The patient states that she got no relief whatsoever with the second trigger point injections. Her pain now includes her neck mainly with also some low-back pain.  The pain in her neck is back to baseline.  She complains of 9/10 pain on a subjective scale.  Her function and quality of life remain the same.  Her sleep is fair. She continues to take Bextra 20 mg daily, which was prescribed on December 9. She states this is helping modestly.  She also continues to take Ultram as needed.  She is not interested in taking any narcotic-based medication at this point.  She is not having any adverse reaction to the Bextra.  She also has associated numbness in her right upper extremity.  This radiates down into the forearm and hand on occasion.  I review health and history form and 14-point review of systems.  PHYSICAL EXAMINATION:  GENERAL:  A healthy female in no acute distress.  VITAL SIGNS:  Blood pressure is 128/82, pulse 66, respirations 18, O2 saturation 99% on room air.  NEUROLOGIC:  Manual muscle testing is 5/5 bilateral upper extremities without any muscle atrophy noted.  Sensory examination is intact to light touch bilateral upper extremities at this time.  Muscle stretch reflexes are 2+/4 bilateral upper extremities.  Spurling maneuver is equivocal on the right  at this time.  Palpatory examination reveals trigger points in the right upper trapezius, levator scapula, infraspinatus.  IMPRESSION: 1. Cervicalgia, status post C4-5 anterior cervical diskectomy with fusion.    The patient has myofascial component but I think that this may be secondary    myofascial pain, which is not responding to trigger point injections.  I    cannot rule out right upper extremity radiculopathy. 2. Mechanical low-back pain.  PLAN: 1. In light of the patients poor response to trigger point injections, I    believe we need to move forward with a trial of cervical epidural steroid    injections to help facilitate the patients overall pain control including    her cervicalgia and right upper extremity radicular symptoms.  We will    refer the patient to Dr. Stevphen Rochester for this. 2. Electromyograph/nerve conduction study of the right upper extremity to rule    out neuropathy versus radiculopathy. 3. The patient has followup scheduled with [name] tomorrow. 4. The patient also has her first physical therapy session tomorrow. 5. The patient to return to clinic this week for electrodiagnostic studies.  The patient was educated on the above findings and recommendations and understands.  There were no barriers to communication. Dictated by:   Sondra Come, D.O. Attending Physician:  Rolly Salter DD:  01/09/01 TD:  01/09/01 Job: [id number]

## 2010-05-22 NOTE — Consult Note (Signed)
[facility]  Patient:    [name], [name] Visit Number: [id number] MRN: [id number]          Service Type: PMG Location: [facility] Attending Physician:  Sondra Come Dictated by:   Sondra Come, D.O. Proc. Date: 07/26/01 Admit Date:  06/06/2001   CC:         Cristi Loron, M.D.   Consultation Report  [name] returns to clinic today as scheduled for reevaluation and possible third lumbar epidural steroid injection for degenerative disk disease of the lumbar spine at L4-5 and L5-S1 with bilateral lower extremity radicular symptoms.  [name] was last seen on July 05, 2001 and underwent a second lumbar epidural steroid injection.  She states that overall her back pain has improved significantly with significant decrease in her lower extremity radicular symptoms.  She does notice still some very mild tingly sensation in her left posterior calf and lateral foot.  She rates her low back pain as a 2/10 on a subjective pain scale.  Her main pain today is her right-sided neck pain which she rates as a 5/10 on a subjective scale.  She has undergone diagnostic cervical facet blocks with 100% relief temporarily and has been waiting to undergo radiofrequency procedure to the medial branches.  I review health and history form and 14 point review of systems.  The patient denies upper extremity radicular symptoms.  PHYSICAL EXAMINATION  GENERAL:  Healthy female in no acute distress.  VITAL SIGNS:  Blood pressure 112/74, pulse 80, respirations 16, O2 saturation 99% on room air.  NEUROLOGIC;  Manual muscle testing is 5/5 bilateral upper and lower extremities.  Sensory examination reveals a slight tingly sensation in the left calf and lateral foot, otherwise normal in the upper and lower extremities.  Muscle stretch reflexes are 2+/4 bilateral upper and lower extremities.  There is tenderness to palpation in the right parascapular muscles with pain on cervical  facet loading to the right.  IMPRESSION: 1. Degenerative disk disease of the lumbar spine at L4-5 and L5-S1 with    greatly improved low back pain and lower extremity radicular symptoms. 2. Cervical facet syndrome.  PLAN: 1. Will forego third lumbar epidural steroid injection as it is not warranted    at this time. 2. Will set patient up with Dr. Stevphen Rochester for radiofrequency procedure to the    right cervical medial branches. 3. Renew Bextra 20 mg b.i.d. #60 with one refill. 4. Continue home exercise program. 5. The patient is to return to clinic for radiofrequency procedure.  The patient was educated on the above findings and recommendations and understands.  No barriers to communication. Dictated by:   Sondra Come, D.O. Attending Physician:  Sondra Come DD:  07/26/01 TD:  07/29/01 Job: [id number]
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
## 2010-05-22 NOTE — Procedures (Signed)
NAME:  [name], [name]               ACCOUNT NO.:  [id number]   MEDICAL RECORD NO.:  [id number]          PATIENT TYPE:  REC   LOCATION:  TPC                          FACILITY:  [facility]   PHYSICIAN:  Erick Colace, M.D.DATE OF BIRTH:  1965-07-26   DATE OF PROCEDURE:  02/24/2004  DATE OF DISCHARGE:                                 OPERATIVE REPORT   DATE OF SERVICE:  February 24, 2004.   MEDICAL RECORD NUMBER:  [id number].   DATE OF BIRTH:  1965-12-29.   PROCEDURES:  Bilateral L4-5 facet denervation and bilateral L5-S1 facet  denervation using medial branch blocks.   A previous procedure was done on January 07, 2004.  She states that because  of the Valium 10 mg given prior to the procedure, she basically went home  and slept for day and could not keep track of her pain scores.  Therefore,  this is more or less a first procedure again.   DESCRIPTION OF PROCEDURE:  Informed consent was obtained after describing  the risks and benefits of the procedure to the patient.  These include  bleeding, bruising, infection, loss of bowel and bladder function, temporary  or permanent partial or total paralysis.   The patient elects to proceed.  She has given written consent.   The patient was placed prone on the fluoroscopy table.  Betadine prep and  sterile drape.  A 25 gauge 1-1/4 inch needle was used to anesthetize the  skin and subcutaneous tissues with lidocaine 1.5 mL 1% x 6 sites.  Then a 22  gauge 3-1/2 inch spinal needle was inserted under fluoroscopic guidance to  bone contact confirmed with lateral imaging as to assess proper depth.  The  left S1 sacral ala junction was targeted first.  After bone contact was  made, Omnipaque 180 x 0.5 mL showed no intravascular uptake.  Then 0.5 mL of  2% lidocaine was injected.  Then the same procedure was performed at the  right S1 sacral ala junction.  Once again bone contact was made using the 22  gauge 3-1/2 inch spinal needle.  Omnipaque  180 x 0.5 mL demonstrated no  intravascular uptake and then 0.5 mL of 2% lidocaine methylparaben-free were  injected.  Then the C-arm was obliqued 10 degrees towards to the right side.  The L5 transverse process SAP junction was targeted.  Bone contact was made.  Omnipaque 180 live fluoroscopy demonstrated no intravascular uptake and 0.5  mL of lidocaine 2% MPF was injected.  Then the L4 SAP transverse process  junction was targeted.  A 22 gauge 3-1/2 inch spinal needle was inserted  under fluoroscopic guidance.  Bone contact was made.  Omnipaque 180 x 0.5 mL  demonstrated no intravascular uptake.  Then 0.5 mL of 2% methylparaben-free  lidocaine was injected.  Then the C-arm was obliqued 10 degrees toward the  left side.  The left L4 SAP transverse process junction was targeted.  Bone  contact was made using a 22 gauge 3-1/2 inch spinal needle and then  Omnipaque 180 x 0.5 mL was injected demonstrating no intravascular uptake.  Then 0.5 mL of 2% MPF lidocaine solution was injected.  Finally, the L5 SAP  transverse process junction on the left was targeted.  Bone contact was made  using a 22 gauge 3-1/2 inch spinal needle.  Omnipaque 180 x 0.5 mL  demonstrated no intravascular uptake.  Then 0.5 mL of lidocaine 2%  methylparaben-free was injected. The patient tolerated the  procedure well.  Post injection instructions given.  The patient is to  follow up in three to four weeks.  Repeat injection to confirm positive  results if obtained.      AEK/MEDQ  D:  02/24/2004 14:17:18  T:  02/24/2004 16:19:02  Job:  [id number]

## 2010-05-22 NOTE — Assessment & Plan Note (Signed)
December 21, 2005   The patient last seen by me for a followup visit from [place] for  radicular pain to the lower extremities.  She has had good relief with  this.  We also started MS-Contin 15 mg b.i.d.  The pain levels rose to a  7/10, but is down from an 8/10, and she feels like overall, she is doing  much better.  Her left arm pain is improved as well.  She forgot which  side her arm was hurting her on initially.   She walks about 8 minutes at a time.  She climbs a few steps.  She needs  some help with meal prep, household duties, and shopping.  Otherwise, is  independent.   EXAMINATION:  Blood pressure 118/65, pulse 75, respiratory rate 16, O2  saturation is 98% on room air.  GENERAL:  In no acute distress.  Mood and affect appropriate.  She has no pain to palpation in the lumbar spine or along the cervical  or thoracic spine.  She has good forward flexion but limited extension.  Her lower extremity strength is normal and her deep tendon reflexes are  normal.   IMPRESSION:  Lumbar radiculitis secondary to lumbar degenerative disk  and facet arthropathy.  Overall, better on Lyrica.   PLAN:  1. Continue Lyrica 150 b.i.d.  2. Continue MS-Contin 15 mg p.o. b.i.d.  3. Continue Ultram 100 mg t.i.d.  4. I will see her back in 2 months.      Erick Colace, M.D.  Electronically Signed     AEK/MedQ  D:  12/21/2005 13:13:26  T:  12/21/2005 15:15:31  Job #:  [id number]

## 2010-05-22 NOTE — Procedures (Signed)
[facility]  Patient:    [name], PROBERT Visit Number: [id number] MRN: [id number]          Service Type: PMG Location: TPC Attending Physician:  Rolly Salter Dictated by:   Jewel Baize Stevphen Rochester, M.D. Proc. Date: 01/31/01 Admit Date:  11/21/2000                             Procedure Report  HISTORY:  Christionna Poland [facility] for second in series of cervical epidurals. Risks, complications, and options are fully outlined. She has improved, improved functional indices, and quality of life indices predicating further injections based on need and responsiveness. She is consented.  PHYSICAL EXAMINATION:  Objectively, she has diffuse paracervical myofascial discomfort, impaired flexion, extension, and lateral rotational pain. Range of motion improved. She has no neurological features, motor, sensory, reflexive.  IMPRESSION: 1. Cervicalgia. 2. Degenerative spinal disease, cervical spine.  PLAN:  Cervical epidural. She is consented.  DESCRIPTION OF PROCEDURE:  The patient is taken to fluoroscopy suite and placed in prone position. Cervical anatomy identified under direct fluoroscopic observation. The neck is prepped and draped in the usual fashion. Using Hustead needle, I advanced to C5-6 interspace without any evidence of CSF, heme, or paresthesia. Test block uneventfully, all 40 mg of Aristocort and flushed needle.  She tolerates the procedure well with no complication from our procedure. Discharge instructions given. Dictated by:   Jewel Baize Stevphen Rochester, M.D. Attending Physician:  Rolly Salter DD:  01/31/01 TD:  02/01/01 Job: [id number]

## 2010-05-22 NOTE — Procedures (Signed)
NAME:  [name], [name]               ACCOUNT NO.:  [id number]   MEDICAL RECORD NO.:  [id number]          PATIENT TYPE:  REC   LOCATION:  TPC                          FACILITY:  [facility]   PHYSICIAN:  Erick Colace, M.D.DATE OF BIRTH:  [birth year]   DATE OF PROCEDURE:  10/26/2004  DATE OF DISCHARGE:                                 OPERATIVE REPORT   MEDICAL RECORD NUMBER:  [id number].   PROCEDURE:  Left sacroiliac joint injection.   INDICATIONS:  Sacroiliac joint arthropathy, previously relieved on May 25, 2004, but now with increasing left buttock and low back pain, despite MS  Contin and nortriptyline.   Pain level currently 8/10.   Informed consent was obtained after describing risks and benefits of the  procedure to the patient. These include bleeding, bruising, infection, loss  of bowel and bladder function, temporary or permanent paralysis, and she  elects to proceed.   The patient placed prone on fluoroscopy table, Betadine prep, sterile drape.  A 25-gauge 1-1/2-inch needle was used to anesthetize skin and subcu tissues  with 1% lidocaine x2 cc. Then a 22-gauge, 3-1/2-inch spinal needle was  inserted into the left SI joint under fluoroscopic guidance. Both AP and  lateral imaging were utilized, and once proper fluoroscopic location was  obtained, Omnipaque 180 x0.5 cc demonstrated good joint arthrogram, and then  a solution containing 0.5 cc of Depo-Medrol 40 mg/cc plus 0.75 cc of 2%  methylparaben-free lidocaine were injected. The patient tolerated the  procedure well. Post injection instructions given. Pre/post injection vital  signs remained stable. I see her back in five weeks for general followup.      Erick Colace, M.D.  Electronically Signed     AEK/MEDQ  D:  10/26/2004 15:15:23  T:  10/27/2004 08:32:05  Job:  [id number]

## 2010-05-22 NOTE — Consult Note (Signed)
NAME:  [name], [name]                           ACCOUNT NO.:  [id number]   MEDICAL RECORD NO.:  [id number]                   PATIENT TYPE:  REC   LOCATION:                                       FACILITY:   PHYSICIAN:  Zachary George, DO                      DATE OF BIRTH:  08/15/1965   DATE OF CONSULTATION:  04/10/2002  DATE OF DISCHARGE:                                   CONSULTATION   HISTORY OF PRESENT ILLNESS:  The patient returns to clinic today for  reevaluation.  She requests repeat lumbar epidural steroid injection.  She  has had two lumbar epidural steroid injections previously back in July 2003  for low-back pain with right lower extremity radicular pain secondary to  degenerative disk disease of the lumbar spine with significant improvement  of her lower back and lower extremity pain.  She states that she has had  increased low-back pain with pain into her right posterior thigh over the  past 3-4 weeks.  She continues to have neck pain with right upper extremity  radicular symptoms and has undergone a myelogram, which she states revealed  a pinched nerve.  She states that [name] did not feel that it was bad  enough to do surgery.  She has had nerve conduction studies and EMG as well  at [facility], which she states revealed irritation of  the nerve where they did surgery.  Her function and quality of life indices  have declined.  Her sleep is fair.  She continues on Bextra 20 mg daily,  Norco rarely for severe pain, and Gabitril 4 mg one p.o. q.a.m. and two p.o.  q.h.s. with questionable improvement.  I reviewed the health and history  form and 14-point review of systems.   PHYSICAL EXAMINATION:  GENERAL:  A healthy female in no acute distress.  VITAL SIGNS:  Blood pressure 105/67, pulse 82, respirations 18.  O2  saturation 100% on room air.  NEUROLOGIC:  Manual muscles testing is 5/5 bilateral lower extremities.  Sensory examination is intact to  light touch bilateral lower extremities at  this time.  Muscle stretch reflexes are symmetric bilateral lower  extremities.  Straight leg raise is equivocal in the seated position on the  right and negative on the left.   IMPRESSION:  1. Degenerative disk disease of the lumbar spine with right lower extremity     radicular symptoms.  2. Cervicalgia.  3. Degenerative disk disease of the cervical spine with neural foraminal     stenosis possibly contributing to right C6 radiculitis.   PLAN:  1. Discuss further treatment options with the patient.  I think it is     reasonable to repeat the lumbar epidural steroid injection x1 to decrease     her lower extremity radicular pain and lower back pain.  2. We will request  electrodiagnostic study report from [facility].  3. Consider cervical selective nerve root block versus repeat cervical     epidural steroid injection.  4. The patient to return to clinic in two weeks for reevaluation.  Consider     further injection therapy based on need.  5. The patient to continue current medications.   The patient was educated on the above findings and recommendations and  understands.  No barriers to communication.                                               Zachary George, DO    JW/MEDQ  D:  04/10/2002  T:  04/11/2002  Job:  [id number]   cc:   Cristi Loron, M.D.  [address]  [place]  [place] [address]  Fax: [phone]

## 2010-05-22 NOTE — Procedures (Signed)
NAME:  [name], [name]               ACCOUNT NO.:  [id number]   MEDICAL RECORD NO.:  [id number]          PATIENT TYPE:  REC   LOCATION:  TPC                          FACILITY:  [facility]   PHYSICIAN:  Erick Colace, M.D.DATE OF BIRTH:  12-Apr-1965   DATE OF PROCEDURE:  02/25/2005  DATE OF DISCHARGE:                                 OPERATIVE REPORT   PROCEDURE:  Coccygeal injection.   INDICATIONS:  Coccydynia. Pain persisting despite morphine plus Ultram.   Informed consent was obtained after describing risks and benefits of the  procedure to the patient. She wished to proceed. The patient was placed  prone on exam table. Nursing in attendance.   Area of coccyx marked, prepped with Betadine x3 and alcohol wipe. A 25-gauge  1-1/2-inch needle was used to inject 0.5 cc of 40 mg/cc of Depo-Medrol plus  2 cc of 1% lidocaine. The patient tolerated the procedure well.  Postinjection instructions given. Last corticosteroid injection was October  of 2006. I will see her back in two months.      Erick Colace, M.D.  Electronically Signed     AEK/MEDQ  D:  02/25/2005 16:53:20  T:  02/26/2005 10:08:06  Job:  [id number]

## 2010-05-22 NOTE — Procedures (Signed)
NAME:  GERYL, DOHN                         ACCOUNT NO.:  [id number]   MEDICAL RECORD NO.:  [id number]                   PATIENT TYPE:  REC   LOCATION:  TPC                                  FACILITY:  [facility]   PHYSICIAN:  Erick Colace, M.D.           DATE OF BIRTH:  [birth year]   DATE OF PROCEDURE:  DATE OF DISCHARGE:                                 OPERATIVE REPORT   PROCEDURE:  Left S1 transforaminal epidural steroid injection.   Ms. Turvey returns today after I last saw her July 01, 2003.  Last left S1  transforaminal epidural steroid injection was May 30, 2003, the previous one  was December 10, 2002.  She had some relief from the last one, but she has  had recurrence of symptoms since that time.   Informed consent was obtained after describing risks and benefits of the  procedure with the patient.  She elects to proceed.   INDICATIONS:  Chronic left S1 radiculopathy only partially relieved by  Avinza and nortriptyline and Mobic.   The patient placed prone on fluoroscopy table, Betadine prep, sterile drape,  1% lidocaine x4 mL injected in the skin and subcu tissues using an inch and  a quarter 25-gauge needle.  Then a 22-gauge 3-1/2 inch spinal needle was  inserted into the left S1 foramen under fluoroscopic guidance using both AP  and lateral views.  Contrast flow under live fluoroscopy demonstrated  epidural spread pattern.  Then a solution containing 1 mL of 40 mg/mL of  Kenalog plus 2 mL of methylparaben-free lidocaine 1% were injected.  The  patient tolerated the procedure well.  Post-injection vitals are stable.  Pre-injection pain level 9/10, post-injection 0/10.                                                Erick Colace, M.D.    AEK/MEDQ  D:  08/08/2003 12:55:39  T:  08/09/2003 14:50:56  Job:  [id number]

## 2010-05-22 NOTE — Op Note (Signed)
NAME:  [name], [name]               ACCOUNT NO.:  [id number]   MEDICAL RECORD NO.:  [id number]          PATIENT TYPE:  AMB   LOCATION:  DAY                           FACILITY:  [facility]   PHYSICIAN:  Lazaro Arms, M.D.   DATE OF BIRTH:  10/30/1965   DATE OF PROCEDURE:  10/08/2003  DATE OF DISCHARGE:                                 OPERATIVE REPORT   PREOPERATIVE DIAGNOSES:  1.  Menometrorrhagia.  2.  Dysmenorrhea.   POSTOPERATIVE DIAGNOSES:  1.  Menometrorrhagia.  2.  Dysmenorrhea.   PROCEDURES:  Hysteroscopy, dilatation and curettage, endometrial ablation.   SURGEON:  Lazaro Arms, M.D.   ANESTHESIA:  General endotracheal.   FINDINGS:  The patient had a normal endometrium, no abnormalities.   DESCRIPTION OF OPERATION:  The patient was taken to the operating room and  placed in the supine position, where she underwent general endotracheal  anesthesia, placed in dorsal lithotomy position, and prepped and draped in  the usual sterile fashion.  Bladder was drained.  A Graves speculum was  placed.  A paracervical block was placed using 10 mL bilaterally of 0.5%  Marcaine with 1:200,000 epinephrine.  The uterus sounded to 8 cm.  The  cervix was dilated serially to allow passage of the hysteroscope.  Hysteroscopy was performed and was normal.  The endometrial ablation  catheter was then placed.  It required 18 mL of D5W to maintain a pressure  of approximately 170 mmHg throughout the procedure, heated up to 87 degrees  Celsius or 180 degrees Fahrenheit for a total therapy time of 9 minutes 32  seconds.  The fluid was all accounted for.  At the end of the procedure the  balloon was removed, the patient was awoken from anesthesia, taken to the  recovery room in good, stable condition, and blood loss was minimal.  She  received Ancef and Toradol prophylactically.      LHE/MEDQ  D:  10/08/2003  T:  10/08/2003  Job:  [id number]

## 2010-05-22 NOTE — Consult Note (Signed)
[facility]  Patient:    [name], [name] Visit Number: [id number] MRN: [id number]          Service Type: PMG Location: TPC Attending Physician:  Rolly Salter Dictated by:   Sondra Come, D.O. Proc. Date: 12/26/00 Admit Date:  11/21/2000   CC:         Cristi Loron, M.D.   Consultation Report  Ms. Mikkelsen returns to clinic today as scheduled for reevaluation.  She was last seen on December 12, 2000 at which time she underwent trigger point injections to the right upper back and periscapular muscles.  She has had 30% relief for approximately three days after the trigger point injections.  Her pain today is a 7/10 on a subjective scale involving her right upper back and neck.  She also has some low back pain which is tolerable.  Function and quality of life indexes remain declined, although improved after trigger point injections. She continues to take Bextra 20 mg daily, Pamelor 10 mg at bedtime with improved sleep.  She also takes Ultram 50 mg p.r.n.  She continues to work, but has some difficulty with some of the motions required in her job.  I review health and history form and 14 point review of systems.  No new neurologic complaints.  PHYSICAL EXAMINATION  GENERAL:  Healthy female in no acute distress.  VITAL SIGNS:  Blood pressure 122/68, pulse 66, respirations 16, O2 saturation 98%.  NEUROLOGIC:  Manual muscle testing is 5/5 bilateral upper extremities. Sensory examination is intact to light touch bilateral upper extremities. Muscle stretch reflexes are 2+/4 bilateral upper extremities.  Spurling maneuver is negative bilaterally.  Palpatory examination reveals active trigger points in the right upper trapezius, levator, scapulae, infraspinatus, and bilateral thoracic paraspinal muscles.  IMPRESSION: 1. Cervicalgia status post C4-5 anterior cervical diskectomy with fusion.  She    has significant myofascial component with the above  noted trigger points. 2. Mechanical low back pain.  PLAN: 1. Repeat trigger point injections.  Informed consent was obtained.  Skin was    prepped in usual sterile fashion.  Each trigger point including the right    upper trapezius, right levator scapulae, right infraspinatus, and bilateral    thoracic paraspinals were injected with 0.5 cc of 1% lidocaine plus 0.5 cc    of 0.25% bupivacaine without complication.  Patient tolerated the procedure    well.  Discharge instructions given. 2. Continue current medications. 3. Patient will write a prescription for work conditioning program at [employer]. 4. If patient gets no relief with the trigger point injections, will consider    referral to Dr. Stevphen Rochester for evaluation for cervical epidural steroid    injection. 5. Patient to return to clinic in two to four weeks for reevaluation.  Patient was educated on the above findings and recommendations and understands.  There were no barriers to communication. Dictated by:   Sondra Come, D.O. Attending Physician:  Rolly Salter DD:  12/26/00 TD:  12/28/00 Job: [id number]
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
## 2010-05-22 NOTE — Consult Note (Signed)
NAME:  [name], [name]                         ACCOUNT NO.:  [id number]   MEDICAL RECORD NO.:  [id number]                   PATIENT TYPE:  REC   LOCATION:  [facility]                                  FACILITY:  [facility]   PHYSICIAN:  Hans C. Stevphen Rochester, M.D.                DATE OF BIRTH:  [birth year]   DATE OF CONSULTATION:  09/26/2001  DATE OF DISCHARGE:                                   CONSULTATION   HISTORY OF PRESENT ILLNESS:  [facility].  I evaluated and reviewed the health and history form and  14-point review of systems.   This was a chaperoned visit.   1. The patient really does not reveal any significant improvement with     facetal medial branch block.  To this end I am going to hold off     consideration of radiofrequency neuroablation technique, and follow her     conservatively.  2. She is breaking through [employer] and Ultram, and is contemplating reporting     to the emergency department for symptom control, most notably of     headaches.  To this end and with full disclosure and discussing     medication, I think it is reasonable to have her trial Mepergan, and she     understands the habituating nature of this medication, and our desire to     minimize potential decline in functional capacity. She remains fairly     active, continues to work and we encourage this.  We also hope that she     is able to maintain this very active lifestyle, and I think tincture of     time may be our best friend here.  I discussed home based therapy with     her.  3. I do not believe any further imaging or diagnostics are warranted.  4. She is seeing [name] in the interim.  He does not have a surgical     position for her.  Consider muscle stimulator.  Consider TENS technology.   OBJECTIVE:  Diffuse paracervical myofascial discomfort.  Impaired flexion,  extension.  Lateral rotational causes cervical pseudocompression test right  greater than left  but no new neurological findings in motor, sensory or  reflexes.   IMPRESSION:  1. Cervicalgia.  2. Degenerative spinal disease cervical spine.   PLAN:  Conservative management.  Discharge instructions given.  Extensive  consultation as to review medications.  We will see her in follow-up in  approximately one month.                                               Hans C. Stevphen Rochester, M.D.    [facility]  D:  09/26/2001  T:  09/26/2001  Job:  [id number]

## 2010-05-22 NOTE — Discharge Summary (Signed)
NAME:  [name], [name]                         ACCOUNT NO.:  [id number]   MEDICAL RECORD NO.:  [id number]                   PATIENT TYPE:  INP   LOCATION:  A320                                 FACILITY:  [facility]   PHYSICIAN:  Barbaraann Barthel, M.D.              DATE OF BIRTH:  05-14-1965   DATE OF ADMISSION:  03/13/2003  DATE OF DISCHARGE:  03/15/2003                                 DISCHARGE SUMMARY   PROCEDURE:  Laparoscopic cholecystectomy on March 14, 2003.   HISTORY OF PRESENT ILLNESS:  This is a 45 year old black female who is  admitted to the medical service via the emergency room for long term  complaints of right upper quadrant pain and nausea.  She was thought to have  at various times GERD-type symptoms and she was seen in the emergency room  and ruled out any cardiac problems.  Sonogram revealed that she had  cholelithiasis and surgery was consulted.  She had mild hyponatremia and  mildly elevated liver function studies.  The hypokalemia responded to  electrolyte correction, and she was taken to surgery the following day at  which time as well her bilirubin was within normal limits.  An open  cholecystectomy was performed.  She had multiple large stones within the  gallbladder.  These were removed.  No cholangiogram was performed as she had  a small cystic duct.  A Jackson-Pratt drain was left at the time of surgery.  No other problems were encountered in the right upper quadrant.  Postoperatively, she did well.  She was tolerating p.o. well.  There was  minimal bloody drainage from her Jackson-Pratt drain which was removed just  prior to her discharge.  She was tolerating p.o. very well and felt better  than she had in months.  She had no dysuria and she had no leg pain or  shortness of breath.   LABORATORY DATA:  At the time of discharge, her white count was on March 15, 2003, 13,000 with an H&H of 10.4 and 31.1.  Her metabolic 7 was grossly  within normal limits.  Blood  sugar was slightly elevated at 126.  Her liver  function studies showed a downward trend.  Her total bilirubin was 0.7 with  a direct of 0.2 and an indirect of 0.5.  Alkaline phosphatase was 53.  Her  SGOT was 40 down from 61.  SGPT was 64, down from 87.  She did have some  hypoalbuminemia at 2.5 and mild hypocalcemia at 8.1.  She will be followed  for this by the medical staff.  She also had some workup preoperatively to  rule out any cardiac problems, troponin levels and CPK total level was  normal.   HOSPITAL COURSE:  Rather uneventful other than some mild urticaria which she  developed likely from the Unasyn upon which she was placed when she was seen  in the emergency room.  Her antibiotics  postoperatively were changed, and  she had no other problems from this.   DISCHARGE INSTRUCTIONS:  She is told to continue her regular medications as  per her regular doctor.  She is excused from work.  She is permitted a soft  diet.  She is told to shower, clean her wound with alcohol three times a  day.  She may walk out doors, indoors and out doors and increase activity as  tolerated.  She is told to refrain from any vigorous lifting, driving or  sexual activity.  We will follow her perioperatively after which she is to  return to her  medical doctor for any problems she may have in the future.  She is told to  take no aspirin products, clean her wound as discussed above, Darvocet N-100  one table q.4h. p.r.n. pain and resume her other medications as per her  medical doctor.  We have made arrangements for follow up to see her.     ___________________________________________                                         Barbaraann Barthel, M.D.   WB/MEDQ  D:  03/15/2003  T:  03/15/2003  Job:  [id number]

## 2010-05-22 NOTE — Assessment & Plan Note (Signed)
[name] returns today.  I last saw her May 25, 2004, for a left S1  transforaminal epidural steroid injection.  Previous visit to that was April  18.  She has a history of left sacroiliac joint arthropathy, chronic left S1  radiculopathy, and low back pain nonresponsive to facet medial branch  blocks.  She has been functional on 30 mg of Avinza daily.  She walks to her  mother's house back and forth about 10 times a day per her report, and this  is a quarter-mile round trip.  She does note some increased leg pain but has  failed to refill her nortriptyline.  She says that her leg pain is mainly  8/9.  She only had very temporary relief in the left lower from the  injection.  Pain interference score is general activity 8, relationship with  other 8, and enjoyment of life 10.  Her sleep is fair.  Pain is worse with  activity as well as prolonged sitting and standing.  She generally does  better if she keeps moving about.  She does drive.  She can climb steps.  Date worked November 2003.   She is married.  As noted above, not employed outside the home.  She keeps  her medications locked in a secure location.   PHYSICAL EXAMINATION:  VITAL SIGNS:  Blood pressure 113/72, pulse 78,  respirations 16, O2 saturation 100% on room air.  GENERAL:  No acute distress.  Mood and affect are tired-appearing but  otherwise no evidence of lability or agitation.  NEUROLOGIC/MUSCULOSKELETAL:  Gait is antalgic when first getting up, but no  evidence of toe drag or knee instability.  Her back is nontender to  palpation.  The legs have full range of motion, full strength and normal  reflexes as well as normal sensation.   IMPRESSION:  1.  Chronic left S1 radiculopathy.  2.  Sacroiliac joint arthropathy.  3.  Lumbar postlaminectomy syndrome, history L5-S1 laminectomy.  She has      also had history of C4-5 anterior diskectomy and fusion in 2002.   PLAN:  1.  Continue current medication of Avinza 30 mg  daily.  2.  Restart nortriptyline 25 mg p.o. q.h.s.  3.  I will see her back in three months.  4.  We will see her for monthly nursing visits, monitor medication usage,      patient education and compliance.       AEK/MedQ  D:  07/23/2004 14:32:51  T:  07/24/2004 06:36:57  Job #:  [id number]   cc:   Cristi Loron, M.D.  [address]  [place]  [place] [address]  Fax: [phone]

## 2010-05-22 NOTE — Consult Note (Signed)
[place]  Patient:    [name], [name] Visit Number: [id number] MRN: [id number]          Service Type: PMG Location: TPC Attending Physician:  Sondra Come Dictated by:   Sondra Come, D.O. Proc. Date: 06/19/01 Admit Date:  06/06/2001   CC:         Cristi Loron, M.D.   Consultation Report  [name] returns to clinic today as scheduled for a trial of lumbar epidural steroid injections for degenerative disk disease of the lumbar spine at L4-5 and L5-S1 with bilateral lower extremity radicular symptoms.  Patient was last seen on June 08, 2001.  I reviewed the health and history form and 14 point review of systems.  Patients pain is a 7/10 on a subjective scale today radiating mainly into the left lower extremity.  She continues on Bextra, Ultram, and Norco.  PHYSICAL EXAMINATION  VITAL SIGNS:  Blood pressure 120/69, pulse 75, respirations 12, O2 saturation 98% on room air.  IMPRESSION: 1. Degenerative disk disease of the lumbar spine at L4-5 and L5-S1 with    bilateral lower extremity radicular symptoms, mainly in the left lower    extremity today. 2. Cervicalgia status post C4-5 anterior cervical diskectomy with fusion.    Positive cervical facet blocks.  PLAN: 1. Lumbar epidural steroid injection. 2. Consider radiofrequency neural ablation of the cervical facets per Dr.    Stevphen Rochester. 3. Continue current medications. 4. The patient is to return to clinic in two weeks for reevaluation and    possible second lumbar epidural steroid injection as predicated upon the    patients response and symptoms.  PROCEDURE:  Lumbar epidural steroid injection.  Procedure was described to the patient in detail including risks, benefits, limitations, and alternatives. The patient wished to proceed.  Informed consent was obtained.  The patient was brought back to the fluoroscopy suite and placed on the table in prone position.  Skin was prepped and  draped in usual sterile fashion.  Skin and subcutaneous tissues were anesthetized with 3 cc of preservative-free 1% lidocaine.  Under direct fluoroscopic guidance an 18-gauge 3.5 inch Tuohy needle was advanced into the left paramedian L5-S1 epidural space with loss of resistance technique.  No CSF, heme, or paraesthesias were noted.  Needle tip placement was confirmed with the injection of 1 cc of Omnipaque 180 revealing appropriate epidural spread.  No vascular uptake noted.  This was then injected with 1.5 cc of Kenalog 40 mg/cc plus 1.5 cc of normal saline with needle flush.  No complications.  The patient tolerated the procedure well. Discharge instructions given.  The patient was educated on the above findings and recommendations and understands.  No barriers to communication. Dictated by:   Sondra Come, D.O. Attending Physician:  Sondra Come DD:  06/19/01 TD:  06/20/01 Job: [id number]

## 2010-05-22 NOTE — Procedures (Signed)
[facility]  Patient:    [name], [name] Visit Number: [id number] MRN: [id number]          Service Type: PMG Location: TPC Attending Physician:  [name] Dictated by:   [name], M.D. Proc. Date: 02/06/01 Admit Date:  11/21/2000   CC:         [name], M.D.   Procedure Report  [name] comes into pain management today. I reviewed her health and history form 14 point review of systems.   1. [name] comes in today and is doing fairly well. In fact, her symptoms     now seem to be lateralizing to the right, and I am wondering if she     does not have a facetal overlay. I would consider injecting the facets,     but I would like to finish out the injection cycling, so I will go ahead     and proceed with a third in a series of cervical epidurals.  2. She is still having some difficulty with pain and range of motion, and     I think it reasonable to continue her on Bextra, and I will give her     Norco for breakthrough pain. I want to minimize an escalation of narcotic     based pain medication, and hence continue with our injection cycling and     continue facetal injection. Positive provocative block leads [place] to     consideration for radiofrequency neural oblation if its warranted at a     later date, and if she is deemed nonsurgical by [name].  3. She relates no advancing neurological features. Escalation of myofascial     discomfort, but no new neurological features, motor, sensory or reflex.  Objectively, she has diffuse paracervical myofascial discomfort and impaired flexion, distention, and lateral rotational pain, positive cervical facetal compression test right greater than left but no new neurological features motor, sensory or reflex.  IMPRESSION:  Cervicalgia, degenerative spinal disease of the cervical spine, cervical facet syndrome.  PLAN:  Cervical epidural, she has consented.  DESCRIPTION OF  PROCEDURE:  The patient was taken to the fluoroscopy suite and placed in the prone position. Fluoroscopic observation, C5-6 interspace identified. Neck was then prepped and draped in the usual fashion. Using a Hustead needle, I advanced to the C5-6 interspace without any evidence of CSF, heme or paresthesia. Test block uneventfully followed 40 mg of Aristocort and flush the needle.  The patient tolerated the procedure well with no complications from our injection. Discharge instructions were given. Instructed to maintain contact with [name] and will see her in on month. Dictated by:   [name], M.D. Attending Physician:  [name] DD:  02/06/01 TD:  02/07/01 Job: [id number]

## 2010-05-22 NOTE — Consult Note (Signed)
   NAME:  [name], [name]                         ACCOUNT NO.:  [id number]   MEDICAL RECORD NO.:  [id number]                   PATIENT TYPE:  REC   LOCATION:                                       FACILITY:   PHYSICIAN:  Zachary George, DO                      DATE OF BIRTH:  1965-04-16   DATE OF CONSULTATION:  DATE OF DISCHARGE:                                   CONSULTATION   PROCEDURE:  Lumbar epidural steroid injection.   INDICATIONS:  Degenerative disk disease of the lumbar spine with right lower  extremity radicular pain in an S1 distribution.  The patient has had two  previous lumbar epidural steroid injections in the past on 06/19/01 and  07/05/01 with significant improvement.  The patient's symptoms have returned.   PROCEDURE DESCRIPTION:  The procedure was described to the patient in detail  including the risks, benefits, limitations, alternatives, and potential side  effects.  Risks include, but are not limited to, bleeding, infection, nerve  injury, spinal headache, paralysis, failure to relieve pain, increased pain,  allergic reaction to medications.  Time for questions was given.  The  patient wishes to proceed.  Informed consent was obtained.  The patient was  brought back to the fluoroscopy sweet and placed on the table in the prone  position.  Skin was prepped and draped in the usual sterile fashion.  The  skin and subcutaneous tissues were anesthetized with 3 cc of preservative-  free 1% lidocaine.  Under direct fluoroscopic guidance, an 18 gauge 3.5 inch  Hustead needle was advanced into the right paramedian L5-S1 epidural space  with loss of resistance technique.  There was no CSF, heme, or paresthesias  noted.  This was then followed by injection of 1 cc of Kenalog 40 mg/cc plus  3 cc of normal saline with needle flush.  There were no complications.  The  patient tolerated the procedure well.  Post-injection pain score of 0/10.  The patient was monitored and released in  stable condition.   The patient was educated about findings and recommendations and understands.  No barriers to communication.                                               Zachary George, DO    JW/MEDQ  D:  04/10/2002  T:  04/11/2002  Job:  [id number]

## 2010-05-22 NOTE — H&P (Signed)
NAME:  [name], PACIFICO                         ACCOUNT NO.:  [id number]   MEDICAL RECORD NO.:  [id number]                   PATIENT TYPE:  INP   LOCATION:  A206                                 FACILITY:  [facility]   PHYSICIAN:  Madelin Rear. Sherwood Gambler, M.D.             DATE OF BIRTH:  06-14-1965   DATE OF ADMISSION:  03/12/2003  DATE OF DISCHARGE:                                HISTORY & PHYSICAL   CHIEF COMPLAINT:  Chest pain.   HISTORY OF PRESENT ILLNESS:  The patient presents with an illness that began  approximately last Thursday with recurrent nausea, vomiting, and diarrhea.  She had a brief interlude of 1-2 days where she felt some better.  However,  this symptom complex returned approximately 24 hours prior to emergency room  visitation.  She had repetitive episodes of vomiting although she denied  hematemesis, hematochezia, or melena; no fever, rigors, or chills; and no  palpitations or syncope.  She developed pain in her epigastric and  substernal area without radiation or other associated cardiopulmonary  symptoms as above.  The pain appeared to be made worse with lying flat but  was not pleuritic in nature.  Specifically, she denied hemoptysis or leg  swelling.  Onset was during rest.  Severity was a 10/10.  She presented to  the emergency department and was evaluated by Dr. Mosetta Putt.  I was called after  diagnostics and IV hydration were performed.   PAST MEDICAL HISTORY:  She has had degenerative disc disease status post  cervical laminectomy and status post epidural steroid injections as recently  as December 2004.  She is evaluated by neurosurgery in [place].  Status  post ectopic pregnancy, cervical discectomy and fusion as above, and  arthritis.   SOCIAL HISTORY:  Positive for cigarette smoking.  Negative for alcohol or  drug abuse.   FAMILY HISTORY:  Positive for a sister with congestive heart failure and a  mother with coronary artery disease as well as multiple family  members with  hypertension.   REVIEW OF SYSTEMS:  As under HPI.   PHYSICAL EXAMINATION:  GENERAL:  She is comfortable-appearing.  Denies any  pain at present.  HEENT:  Showed no JVD or adenopathy.  NECK:  Supple.  CHEST:  Clear.  CARDIAC:  Regular rhythm without gallop or rub.  ABDOMEN:  Was soft and interestingly nontender in any quadrant.  Negative  Murphy's sign.  No organomegaly or masses.  EXTREMITIES:  Without clubbing, cyanosis, edema.  NEUROLOGIC:  Nonfocal.   LABORATORY DATA:  Laboratory studies were reviewed.  Initially she presented  with an 18,000 white count, prerenal azotemia, and a bump in her creatinine  to 2.0.  Her amylase was normal, lipase minimally elevated.  Liver function  tests were otherwise unrevealing.  Her H&H was okay and platelet count was  normal.  Electrocardiogram revealed sinus tachycardia with nonspecific ST  and T wave changes, initial EKG.  Her second one was unchanged with a  slowing of the heart rate after IV hydration.  CT of her chest was  performed.  She was noted to have gallbladder wall thickening as well as  cholelithiasis.  Specifically, the CT done to rule out Boerhaave's syndrome  and mediastinitis due to her repetitive vomiting and chest pain, as well as  white count.  However, thankfully this was negative.  She received hydration  and analgesia overnight.  Her cardiac enzymes are negative.   The patient was discussed in consultation with Dr. Malvin Johns who is requested  for surgery and he will see the patient when he is available.  In the  meantime she will be admitted to medicine.  IV antibiotics will be started,  analgesic continued, and hydration continued.     ___________________________________________                                         Madelin Rear. Sherwood Gambler, M.D.   LJF/MEDQ  D:  03/13/2003  T:  03/13/2003  Job:  [id number]

## 2010-05-22 NOTE — Assessment & Plan Note (Signed)
PAIN AND REHABILITATION FOLLOWUP CLINIC NOTE   DATE OF VISIT:  August 31, 2005   SUBJECTIVE:  Ms. Ziesmer returns today, scheduled for followup visit.  She was  last seen by my June 17, 2005.  She was doing fairly well at that point.  Main complaints at that point were coccydynia and had coccygeal injections.  More recently she has felt increase in pain in her low back and mid back  area.  She has some tingling in her lower extremities but no weakness. No  bowel or bladder complaints other than constipation.  Denies any trauma.  States she felt some increased back pain while walking in her house.   OBJECTIVE:  GENERAL APPEARANCE:  Mood and affect are mildly labile, tearful.  VITAL SIGNS:  Blood pressure 134/75, pulse 75, respirations 16, oxygen  saturation 100% on room air.  NEUROLOGICAL:  Orientation x3.  Her gait is shuffling but is able to toe  walk and heel walk when prompted, although this does cause some back pain.  She has no lower extremity wasting or fasciculations.  She has normal deep  tendon reflexes.  She has intact sensation to light touch in the lower  extremities.   IMPRESSION:  Exacerbation of chronic low back pain.  She does have some  increased radicular symptoms that are bilateral in lower extremities but no  focal signs.  No bowel or bladder complaints or lower extremity weakness.   PLAN:  Start Medrol Dosepak in addition to increasing her morphine sulfate  from 15 q.h.s. to b.i.d.  Will continue her Ultram two p.o. t.i.d. Will set  up an appointment with [name] to evaluate but certainly if the Dosepak  and increased morphine help, she may not need to proceed.      Erick Colace, M.D.  Electronically Signed     AEK/MedQ  D:  08/31/2005 13:26:15  T:  09/01/2005 09:19:46  Job #:  [id number]   cc:   Cristi Loron, M.D.  Fax: [phone]

## 2010-05-22 NOTE — Consult Note (Signed)
[facility]  Patient:    ALEXANDRE, LIGHTSEY Visit Number: [id number] MRN: [id number]          Service Type: PMG Location: [facility] Attending Physician:  Rolly Salter Dictated by:   Jewel Baize Stevphen [place], M.D. Admit Date:  03/03/2001                            Consultation Report  REASON FOR CONSULTATION:  [facility]. I evaluate her. I review health and history form and 14-point review of systems; a chaperoned visit; nursing present.  1. The patient comes in today complaining of lateralizing paracervical    discomfort. Her pain is primarily lateralizing to the right arm, but    she also has some left paracervical suprascapular myofascial discomfort.    She describes pain as consistent with C5-6 and C6-7, and she has had    some improvement with cervical epidural but continues to break through.    She has difficulty with extension of her head and lateral rotational    capacity.     She relates no specific neurological advancement. She has adequate grip    strength. She continues to work but is having difficulty with her normal    activities of daily living. She is very motivated and continues to work,    and we will continue to encourage this. We review her work related    environment. I do not believe she is at risk.  2. Review her medications. She is appropriate. She has headaches that    are consistent with this problematic presentation, and I do believe    they are cervicogenic. She describes primarily a C2-C3 cephalgic    with extension of the suprascapular and paracervical musculature.    She is not having significant improvement with Imitrex.  3. I am going to go ahead and give her [facility] for breakthrough    as she will use this sparingly and appropriately and not at work.    She will rely on Ultram for overall pain control, and risk of these    medications are discussed. She continues on Bextra.  She has Neurontin    at home which I will ask her to take for breakthrough headache, and    restorative sleep capacity at h.s.  4. She relates no new advancing neurological features. I do not believe    further imaging or diagnostics are warranted. She has maintained a    contact with primary care and [name].  Objectively, she has diffuse paracervical myofascial discomfort, impaired flexion, extension, and lateral rotational pain. Positive cervical facetal compression test, right greater than left. No new neurological features, motor, sensory, or reflex.  IMPRESSION: 1. Cervicalgia. 2. Degenerative disk disease, cervical spine. 3. Cervical facet syndrome.  PLAN: 1.  Would consider facetal injection to the right side as positive     provocative block led [place] to consideration of radiofrequency     neuroblation. Rationale is we would move toward neurotomy as     her headache pattern seems to be escalating, and she is     non surgical at this time. I do think neurotomy would help [place]     25%-50%, more importantly, decrease the likelihood of     escalation of narcotic based pain medication nor probability     of further decline in functional indices. 2.  I review these medications with  her. I do not believe further     imaging or diagnostics are warranted. 3.  Will see her in followup. Consider facetal injection. At some     point, we may also consider a transforaminal approach to the     cervical nerve roots, but I think the risk or benefit favors     facetal injection. We will see her in followup. Extensive     consultation. Discharge instructions given. Dictated by:   Jewel Baize Stevphen [place], M.D. Attending Physician:  Rolly Salter DD:  03/28/01 TD:  03/29/01 Job: [id number]

## 2010-05-22 NOTE — Consult Note (Signed)
[facility]  Patient:    [name], [name] Visit Number: [id number] MRN: [id number]          Service Type: PMG Location: TPC Attending Physician:  Sondra Come Dictated by:   Sondra Come, D.O. Proc. Date: 07/05/01 Admit Date:  06/06/2001   CC:         Cristi Loron, M.D.   Consultation Report  REASON FOR FOLLOWUP:  The patient returns to clinic today as scheduled for reevaluation of possible second lumbar epidural steroid injection.  The patient was last seen on June 19, 2001 and underwent a lumbar epidural steroid injection for degenerative disk disease of the lumbar spine at L4-5 and L5-S1 with bilateral lower extremity radicular symptoms.  The patient states that the epidural steroid injection helped modestly for just less than a week.  Her pain is back to baseline today.  She describes her pain as constant, dull, achy, throbbing, radiating into bilateral lower extremities, mainly on the left.  Pain today is a 7/10 on a subjective scale.  She wishes to proceed with a second injection.  I reviewed the health and history form and 14-point review of systems.  She continues on Bextra 20 mg daily with questionable efficacy; she also takes Ultram one to two as needed and Norco just as needed for severe pain but takes this rarely.  PHYSICAL EXAMINATION:  GENERAL:  Physical exam reveals a healthy female in no acute distress.  VITAL SIGNS:  Blood pressure 117/72, pulse 67, respirations 12, O2 saturation 100% on room air.  NEUROMUSCULAR:  Manual muscle testing is 5/5, bilateral lower extremities. Sensory exam intact to light touch in bilateral lower extremities.  Muscle stretch reflexes are 1+/4, bilateral patellar, medial hamstrings and Achilles.  IMPRESSION:  Degenerative disk disease of the lumbar spine at L4-5 and L5-S1 with bilateral lower extremity radicular symptoms.  PLAN: 1. Lumbar epidural steroid injection. 2. Patient to return  to clinic in two weeks for reevaluation and possible    repeat epidural injection if symptoms are improved. 3. If the patient has no significant improvement, we will consider lumbar    facet blocks versus discography to further pursue the pain generator.  PROCEDURE:  Lumbar epidural steroid injection.  INFORMED CONSENT:  Procedure was described to the patient in detail including risks, benefits, limitations and alternatives.  The patient wishes to proceed. Informed consent was obtained.  DESCRIPTION OF PROCEDURE:  The patient was brought back to the fluoroscopy suite and placed on the table in a prone position.  Skin was prepped and draped in the usual sterile fashion.  Skin and subcutaneous tissues were anesthetized with 3 cc of preservative-free 1% lidocaine.  Under direct fluoroscopic guidance, an 18-gauge 3-1/2-inch Tuohy needle was advanced into the left paramedian L5-S1 epidural space with loss-of-resistance technique. No CSF, heme or paresthesias were noted.  This was then injected with 1.5 cc of Kenalog 40 mg/cc plus 1.5 cc of normal saline with needle flush.  There were no complications.  The patient tolerated the procedure well.  Discharge instructions given.  The patient was educated in the above findings and recommendations and understands.  No barriers to communication. Dictated by:   Sondra Come, D.O. Attending Physician:  Sondra Come DD:  07/05/01 TD:  07/08/01 Job: [id number]

## 2010-05-22 NOTE — Consult Note (Signed)
NAME:  Oney, Tatlock NO.:  [id number]   MEDICAL RECORD NO.:  [id number]                    PATIENT TYPE:   LOCATION:                                       FACILITY:   PHYSICIAN:  Zachary George, DO                      DATE OF BIRTH:  1965/07/12   DATE OF CONSULTATION:  01/01/2002  DATE OF DISCHARGE:                                   CONSULTATION   [name] returns to clinic today for re-evaluation.  I saw her on 12/21/01.  In the interim, she has had a repeat MRI of her cervical spine secondary to  worsening cervicalgia with persistent right upper extremity radicular  symptoms in a C5-6 distribution.  The MRI of the cervical spine revealed  diffuse degenerative changes.  AT C3-4 there were posteriorly projecting  osteophytes and a moderate disk protrusion which, according to the report,  is slightly more prominent than on the examination one year ago.  At C4-5,  there is anterior cervical diskectomy and fusion with canal and neuroforamen  widely patent.  At C5-6, there is spondylosis with posteriorly projecting  osteophytes and shallow protrusion of disk material with neuroforaminal  encroachment on the right, possible effecting the right C6 nerve root.  At  C6-7, there are small osteophytes and a shallow broad-based protrusion.  At  C7 through T1 and T1-2, there is no abnormality noted.  I have reviewed the  MRI report with [name] in detail and discussed further treatment options.  Her pain is still an 8/10 on a subjective scale with declining function and  quality of life indices which is making her somewhat depressed despite  Effexor 37.5 mg b.i.d.  She continues to take Celebrex with minimal  improvement.  I also gave her a trial of Zonegran 100 mg daily which she  states caused her skin to break out and did not help her radicular  component.  I note that she has taken Neurontin in the past with similar  effect.  Her sleep is fair with Pamelor.   I have reviewed health and history  forms and 14 point review of systems.   PHYSICAL EXAMINATION:  GENERAL:  Reveals a healthy-appearing female in no  acute distress.  VITAL SIGNS:  Blood pressure 110/60, pulse 68, respirations 14 and regular,  O2 saturation is 99% on room air.  NEUROLOGICAL:  No new neurological findings in the upper extremities.  Mini  muscle testing is 5/5.  Sensory exam is intact to light touch.  Spurling's  maneuver is equivocal on the right, negative on the left.  Muscle stretch  reflexes are 2+/4 bilateral biceps, triceps, brachioradialis and left  pronator teres and 0/4 right pronator teres.  Palpation examination reveals  mild tenderness to palpation in the right parascapular muscles which does  not exactly reproduce the patient's symptoms.   IMPRESSION:  1. Cervicalgia.  2. Degenerative disk disease of the cervical spine with neuroforaminal     stenosis, possibly contributing to a right C6 radiculitis.  3. Status post C4-5 anterior cervical diskectomy and fusion.   PLAN:  1. Discontinue Zonegran.  2. Will give her a trial of Medrol Dosepak.  3. Will start physical therapy for intermittent cervical traction and home     traction if helpful.  4. Continue hydrocodone just as needed for severe pain.  5. Hold Celebrex while taking the Medrol Dosepak ad then resume Celebrex     after the Dosepak is completed.  6. Recommend following up with [name] with new MRI to determine if     there is any role for surgical intervention at the right C5-6     neuroforamen.  Could consider a right C6 selective nerve root block     diagnostically as well as repeat electrodiagnostic studies which were     normal approximately one year ago.  7. The patient will return to the clinic in one month for re-evaluation.   The patient was educated about findings and recommendations and understands.  No barriers to communication.                                                Zachary George, DO    JW/MEDQ  D:  01/01/2002  T:  01/01/2002  Job:  [id number]   cc:   Cristi Loron, M.D.  [address]  [place]  [place] [address]  Fax: [phone]

## 2010-05-22 NOTE — Assessment & Plan Note (Signed)
MEDICAL RECORD NUMBER:  [id number]   A 45 year old female with chronic left S1 radiculopathy, sacroiliac joint  arthropathy on the left, and lumbar post laminectomy syndrome status post L5-  S1 laminectomy, as well as chronic neck pain status post C4-5 anterior  discectomy and fusion, who I last saw on July 23, 2004. Her last sacroiliac  joint injection was done on April 30, 2004. Her last S1 injection was done  May 25, 2004. Continues to be independent with her self care, walks 5-10  minutes at a time. Pain level is about 8/10 today, but states that it is  because it is raining. Pain interference score:  General activity 8,  relationships with other people 3, enjoyment of life 8. Sleep is fair,  improved since restarting nortriptyline. Date last worked:  October 2003.   REVIEW OF SYSTEMS:  Trouble walking due to pain, spasms, tingling, but  relatively unchanged.   INTERVAL MEDICAL HISTORY:  Negative. Had a history and physical recently.   EXAMINATION:  Blood pressure 98/56, pulse 83, respirations 16, O2 saturation  98% in room air.   In general, no acute distress, mood and affect appropriate. Her back has  tenderness to palpation in the left PSIS area, positive Faber's in the left  PSIS area. Deep tendon reflexes are normal. Motor strength is normal in the  lower extremity. Left hip external rotation is reduced. Otherwise, lower  extremity range of motion is normal. Pain diagram indicates pain at the base  of her neck and in her low back.   IMPRESSION:  1.  Left sacroiliac joint arthropathy. This seems to be the majority of her      pain complaint at the current time.  2.  Left S1 chronic radiculopathy. This seems to be a less significant      complaint at this time.  3.  Cervicalgia, cervical post laminectomy syndrome. Does not appear to be      functionally limiting her at this time.   PLAN:  1.  Given her co-pay with [employer] at $40 per month, and her husband being on  strike at [employer], will try her on MS Contin 15 b.i.d. which she states      should only be a $10 co-pay in a generic form.  2.  Continue nortriptyline 25 p.o. q.h.s.  3.  Will schedule for sacroiliac joint injection.   Will possibly need to dose the MS Contin t.i.d. if her duration of effect is  only 8 hours.      Erick Colace, M.D.  Electronically Signed     AEK/MedQ  D:  10/20/2004 09:25:54  T:  10/20/2004 09:47:34  Job #:  [id number]   cc:   Cristi Loron, M.D.  Fax: [phone]
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
## 2010-05-22 NOTE — Consult Note (Signed)
NAME:  [name], [name]                         ACCOUNT NO.:  [id number]   MEDICAL RECORD NO.:  [id number]                   PATIENT TYPE:  REC   LOCATION:  [facility]                                  FACILITY:  [facility]   PHYSICIAN:  Zachary George, DO                      DATE OF BIRTH:  Feb 08, 1965   DATE OF CONSULTATION:  03/13/2002  DATE OF DISCHARGE:                                   CONSULTATION   HISTORY OF PRESENT ILLNESS:  The patient returns to the clinic today for  reevaluation.  She was last seen on February 12, 2002.  In the interim she  has followed up with [name], who does not feel like she is a surgical  candidate in regards to her recurrent right upper extremity radicular  symptoms.  She states she has had electrodiagnostic studies in the interim  at [facility] and states that there was some nerve  irritation, although I do not have a copy of the electrodiagnostic study  report.  Her pain today is an 8/10 on a subjective scale.  She continues  taking Bextra 20 mg daily, with modest improvement.  She also has been  taking hydrocodone 5 mg, which has not really seemed to help her pain and  makes her drowsy.  She has been unable to tolerate NEURONTIN and ZONEGRAN  secondary to rash.  Her functional and quality of life indices remain  declined.  She is still unable to go back to work secondary to her pain.  She has not had any lasting improvement with trigger point injections or  cervical epidural steroid injections.  Cervical medial branch blocks have  essentially been nondiagnostic.  I reviewed the health and history form and  14-point review of systems.  The patient continues to have complaint of neck  pain radiating into her right upper extremity primarily.  Her symptoms are  constant, achy, throbbing.  She denies any significant weakness.  She has  occasional paresthesias as well.   PHYSICAL EXAMINATION:  GENERAL:  Healthy female in no acute distress.  VITAL SIGNS:  Blood pressure 119/76, pulse 71, respirations 18, O2  saturation 97% on room air.  NEUROLOGIC:  Manual muscle testing is 5/5 bilateral upper extremities.  Sensory examination is intact to light touch bilateral upper extremities.  Muscle stretch reflexes are 2+/4 bilateral biceps, brachioradialis, pronator  teres, and 1+/4 bilateral triceps.  Spurling maneuver is negative  bilaterally but causes axial neck pain.  Palpatory examination reveals  diffuse generalized periscapular tenderness bilaterally without active  trigger points at this time.   IMPRESSION:  1. Cervicalgia.  2. Degenerative disk disease of the cervical spine with neuroforaminal     stenosis and right upper extremity radicular symptoms, status post C4-5     anterior cervical diskectomy and fusion.   PLAN:  1. Will begin trial of Gabitril 4 mg,  1 p.o. q.h.s. for seven days, then 1     p.o. b.i.d., #30 sample pills provided.  Patient instructed to call our     clinic in two to three weeks to let us know if it is helping.  Will     consider titrating the dose to effect as tolerated.  2. Continue Bextra 20 mg daily.  3. Continue home exercise program.  4. Patient to return to clinic in one month for reevaluation.   The patient was educated on the above findings and recommendations and  understands.  There were no barriers to communication.                                               Zachary George, DO    JW/MEDQ  D:  03/13/2002  T:  03/13/2002  Job:  [id number]   cc:   Cristi Loron, M.D.  [address]  [place]  [place] [address]  Fax: [phone]

## 2010-05-22 NOTE — Consult Note (Signed)
NAME:  [name], [name]                         ACCOUNT NO.:  [id number]   MEDICAL RECORD NO.:  [id number]                   PATIENT TYPE:  REC   LOCATION:  [facility]                                  FACILITY:  [facility]   PHYSICIAN:  Sondra Come, D.O.                 DATE OF BIRTH:  Jul 14, 1965   DATE OF CONSULTATION:  10/23/2001  DATE OF DISCHARGE:                                   CONSULTATION   HISTORY OF PRESENT ILLNESS:  The patient returns to the clinic today for  reevaluation.  She was last seen by Dr. Stevphen Rochester on September 26, 2001.  He  has performed repeat right cervical medial branch blocks which were  nondiagnostic.  Based on this, she is not a candidate for radiofrequency  neuroablation.  She continues to complain mainly of right-sided neck pain  with occasional radiation into the right upper extremity.  She also has some  degree of headache which she describes as a migraine and states she has had  them for several years, and they are relieved by Imitrex, which was written  by Dr. Stevphen Rochester last visit.  She has intermittent low back pain, but this is  typically manageable and tolerable.  She has, essentially, discontinued all  of her medications.  Dr. Stevphen Rochester had written a prescription for [facility] at last visit, but the patient discontinued this secondary to  spotting.  She is no longer taking Neurontin, which was not helpful.  She is  no longer taking hydrocodone, which she states really did not help her pain,  and she is no longer taking Bextra, which was helping to some degree at one  point.  I reviewed the health and history form and 14-point review of  systems.  The pain is a 5/10 on a subjective scale.  Function and quality of  life indices remain declined.  Sleep is fair to poor.   PHYSICAL EXAMINATION:  GENERAL:  Healthy female in no acute distress.  VITAL SIGNS:  Blood pressure 122/63, pulse 67, respirations 18, O2  saturation is 94% on room air.  BACK, NECK:  Full  range of motion of the cervical spine.  She has  significant tenderness in the right periscapular muscles with an active  trigger point in the right upper trapezius which reproduces the patient's  upper extremity radicular component.  She also has some tenderness in the  cervical paraspinal muscles.  EXTREMITIES:  Neurologically intact in the upper extremities including  motor, sensory, and reflexes.   IMPRESSION:  1. Cervicalgia.  2. Degenerative disk disease of the lumbar spine at L4-5 and L5-S1, with     improved low back pain.   PLAN:  1. I had a long discussion with the patient regarding further treatment     options.  At this point I will go ahead and get her set up for     neuromuscular  stimulation through [facility] as an adjunctive     treatment.  2. Will begin lidocaine 5% patches, apply up to 12 hours per day, maximum     three patches at a time, #30 with one refill.  3. Will begin Pamelor 10 mg 1-2 p.o. q.h.s., #60 with one refill for     restorative sleep.  4. Patient is to return to clinic in one month for reevaluation.   The patient was educated on the above findings and recommendations and  understands.  There were no barriers to communication.                                               Sondra Come, D.O.    JJW/MEDQ  D:  10/23/2001  T:  10/23/2001  Job:  [id number]

## 2010-05-22 NOTE — Assessment & Plan Note (Signed)
PROCEDURE:  Left S1 transforaminal epidural steroid injection.   PHYSICIAN:  Erick Colace, M.D.   DESCRIPTION OF PROCEDURE:  An informed consent was obtained, after  explaining the risks and benefits of the procedure, with the risks of  bleeding, bruising and infection, since rash and corticosteroids came up  with contrast medications.   The patient was placed in the prone position, and prepped with Betadine, a  sterile drape, and the procedure.  Multiple fluoroscopic images were  obtained.  The S1 foramen was identified on the left side.  Lidocaine was  infiltrated under the skin and subcutaneous tissues 1.5 mL.  A #22 gauge,  3.5 inch spinal needle was inserted on the left side, and under fluoroscopic  guidance directed into the S1 foramen.  Lateral images confirmed depth.  Epidural spread was demonstrated using Omnipaque 180 x 0.5 mL,  __________ 1  mL of 40 mg per mL Kenalog, and 2 mL of 1% methylparaben and free lidocaine  was injected, a total of 2.5 mL.  The patient tolerated the procedure well.   Post-injection instructions given.   FOLLOWUP:  The patient is to follow up in two to three weeks to monitor  improvement and if she feels better, to resume physical therapy.      Erick Colace, M.D.   AEK/MedQ  D:  12/10/2002 13:30:59  T:  12/10/2002 14:04:06  Job #:  [id number]

## 2010-05-22 NOTE — Op Note (Signed)
[name], [name] NO.:  [id number]   MEDICAL RECORD NO.:  [id number]                   PATIENT TYPE:   LOCATION:                                       FACILITY:   PHYSICIAN:  Hans C. Stevphen Rochester, M.D.                DATE OF BIRTH:   DATE OF PROCEDURE:  DATE OF DISCHARGE:                                 OPERATIVE REPORT   HISTORY:  [name] [facility] to [facility] and I  evaluated and reviewed her health and history form and 14 point review of  systems.   1. Originally scheduled for cervical RF, I am going to go ahead and hold     off. I reviewed the chart. She has had quite a few interventional     procedures, but does seem to be improving. She does lateralize her     symptoms to the right, has demonstrated a positive provocative block and     as she had some confusion whether she has improved after previous facetal     injection, it is reasonable to go ahead and block her, have her follow-up     in a month and determine medical necessity for a radiofrequency neural     oblation. It is quite possible the block itself will help, and I     discussed this with her.  2. I am also mindful of steroid exposure and will minimize steroid exposure     in this block, and she is to let us know about medication usage, range of     motion in the light, and further directed care will be dictated to     symptom improvement.   We plan C4-5, 6 and 7 at independent access point on the right side, C4 is  contributory innervation. She understands the risks of the procedure and  accepts.   Objectively, diffuse paracervical myofascial discomfort, positive cervical  facetal compression test right greater than left. She has no new  neurological findings motor, sensory or reflexive.   IMPRESSION:  Cervicalgia, degenerative spinal disease cervical spine,  cervical facet syndrome, degenerative spinal disease lumbar spine.   PLAN:  Cervical  facet injection C4-5, 6 and 7 at independent needle access  points. She has consented.   DESCRIPTION OF PROCEDURE:  The patient taken to the fluoroscopy suite and  placed in supine position, neck prepped and draped in the usual fashion.  Using a 25 gauge needle, I advanced to the cervical facet of C4, 5, 6 and 7  at independent needle access points. I confirmed placement. I then injected  0.5 cc of lidocaine 1% MPF with at each level with a total of 10 mg of  Aristocort in divided dose.   Tolerated this procedure well, no complications from our procedure.  Discharge instructions given. Appropriate recovery and will see her in  follow-up.  Hans C. Stevphen Rochester, M.D.    [facility]  D:  08/22/2001  T:  08/22/2001  Job:  [id number]

## 2010-05-22 NOTE — Assessment & Plan Note (Signed)
DATE OF BIRTH:  1965-10-12.   Ms. Wee returns after I last saw her for left S1 transforaminal and  epidural steroid injection.  Her pre-injection pain level was 9/10, post  injection 0/10.  She states that she went home and slept well.  She did well  for a couple of days but then noticed some vaginal spotting.  She contacted  her primary doctor who advised her to stop all of her medicines.  Her  medicines include Mobic, Avinza and nortriptyline.  The patient did this and  was given what sounds like some type of oral contraceptives and this  improved with no further vaginal bleeding.  She does not increased pain.  She has been off her medications for 2 weeks or more.  Pain level is about  8/10 on average.   SOCIAL HISTORY:  She continues to smoke.  She is not working.   REVIEW OF SYMPTOMS:  Poor sleep, numbness left lower extremity and some  dizziness.   PHYSICAL EXAMINATION:  Blood pressure 104/76; pulse 79; respirations 18; O2  saturation 99% on room air.  General:  She is standing.  She feels more  comfortable that way.  She has mild tenderness to palpation in the gluteus  medius area bilaterally.  No particular pain over the SI area or over the  sacrum itself.  No masses palpated.  Her range of motion is approximately  50% range for flexion and 25% extension and lateral bending, which is  similar to prior except for flexion, which was about 75% on July 01, 2003.  Deep tendon reflexes are 2+ bilateral knees and ankles.  She has good range  of motion at the hips.  She has full strength bilateral hip flexor, knee  extensor, ankle dorsiflexion and great toe extensor.  She is able to toe  walk and heel walk.  She is able to do a partial squat.  Her legs have no  swelling.   IMPRESSION:  1.  Chronic left S1 radiculopathy as well as cervical post laminectomy      syndrome and lumbar degenerative disc and mild fascial pain syndrome.      Her pain exacerbation I believe is more a  consequence of being off her      medications.  She certainly has no new neurologic side effects.  2.  Vaginal spotting.  This could be related to the steroid injections, and      if further steroid injections are contemplated, this needs to be      discussed as one of the potential effects.  Certainly, the Mobic may      have some influence on this.   RECOMMENDATIONS:  1.  We will restart her on Avinza 30 mg p.o. daily, restart nortriptyline 25      p.o. at bedtime.  2.  We will see her about 3 weeks.  If she is not getting back to her usual      pain baseline, consider imaging of lumbar spine.      Erick Colace, M.D.   AEK/MedQ  D:  09/16/2003 11:39:35  T:  09/16/2003 13:04:56  Job #:  [id number]

## 2010-05-22 NOTE — Procedures (Signed)
NAME:  [name], [name]               ACCOUNT NO.:  [id number]   MEDICAL RECORD NO.:  [id number]          PATIENT TYPE:  REC   LOCATION:  [facility]                          FACILITY:  [facility]   PHYSICIAN:  Erick Colace, M.D.DATE OF BIRTH:  Aug 11, 1965   DATE OF PROCEDURE:  05/25/2004  DATE OF DISCHARGE:                                 OPERATIVE REPORT   MEDICAL RECORD NUMBER:  [id number].   PROCEDURE:  Left S1 transforaminal epidural steroid injection under  fluoroscopic guidance.   INDICATIONS:  Chronic left S1 radiculopathy, prior relief for seven months  with previous left S1 transforaminal epidural steroid injection.   Informed consent was obtained after describing risks and benefits of the  procedure to the patient. These include bleeding, bruising, infection, loss  of bowel and bladder function, temporary or permanent paralysis, and she  elects to proceed.   The patient placed prone on fluoroscopy table, Betadine prep, sterile drape.  A 25-gauge 1-1/4-inch needle was used to anesthetize skin and subcu tissues  with 1% lidocaine x22. Then a 22-gauge 3-1/2-inch spinal needle was inserted  in the left S1 foramen under fluoroscopic guidance. Once proper needle  location was ascertained using AP and lateral imaging, Omnipaque 180 using  IV extension tubing was injected under live fluoro. No intravascular uptake  was seen. Good foraminal flow. Then a solution containing 0.75 cc of 40  mg/cc Kenalog plus 2 cc of 1% methylparaben free lidocaine were injected.  The patient  tolerated the procedure well. Preinjection pain level 10/10.  Postinjection pain level 9/10. She will follow up in one month. Another  Avinza prescription was written.      AEK/MEDQ  D:  05/25/2004 10:53:27  T:  05/25/2004 12:08:49  Job:  [id number]

## 2010-05-22 NOTE — Consult Note (Signed)
NAME:  [name], [name]                         ACCOUNT NO.:  [id number]   MEDICAL RECORD NO.:  [id number]                   PATIENT TYPE:  REC   LOCATION:  [facility]                                  FACILITY:  [facility]   PHYSICIAN:  Zachary George, DO                      DATE OF BIRTH:  [birth year]   DATE OF CONSULTATION:  DATE OF DISCHARGE:                                   CONSULTATION   PROGRESS NOTE:  [name] returns to the clinic today for re-evaluation.  She was last seen on January 01, 2002.  In the interim, [name] is  followed up with Dr. Tressie Stalker in regards to her neck pain with  cervical radiculitis.  She underwent a myelogram on February 02, 2002,  complicated by dural leak with spinal headache for which she underwent a  blood patch.  She follows up with [name] on February 13, to go over the  myelogram.  She continues to complain of neck pain radiating into her right  upper extremity to her hand and now radiating into her left upper extremity  through her elbow.  She has associated numbness and paresthesias.  She has  been to physical therapy and had a trial of intermittent cervical traction  which she said caused her to have a severe migraine headache and that was  discontinued.  She continues stretching and a home exercise program.  The  patient denies any low back pain at this time.   MEDICATIONS:  She continues to take Norco sparingly because it makes her  drowsy.  We have tried multiple medications, many of which she is unable to  tolerate secondary to allergic reaction.  Other medications, including  NSAIDs and Ultracet have not helped her.  The hydrocodone does seem to help  her to some degree, however, she does experience somnolence with opiate-  based pain medication.   REVIEW OF SYMPTOMS:  I reviewed her health and history form and 14-point  review of systems.  Her pain today is 8 over 10 on the subjective scale.  Function and quality of life indices  remain declined, sleep is poor.  She  remains out of work, but really would like to get back to work at some  point.   PHYSICAL EXAMINATION:  GENERAL:  Reveals a healthy female in no acute  distress.  VITAL SIGNS:  Blood pressure 111/62, pulse 84, respirations 16.  MUSCULOSKELETAL:  Manual muscle testing is 5/5 bilateral upper and lower  extremities.  Sensory examination is intact to light touch bilateral upper  and lower extremities.  Muscle strength and reflexes are 2+/4 bilateral  biceps, brachioradialis, pronator teres, patellar, medial hamstrings, and  1+/4 bilateral triceps and Achilles.  Spurling maneuver is negative  bilaterally today, but does cause some axial neck pain.  There is no atrophy  noted in the upper and lower extremities.  IMPRESSION:  1. Cervicalgia.  2. Degenerative disk disease of the cervical spine with neural foraminal     stenosis and right greater than left cervical radiculitis.  The patient     is status post C4-5 anterior cervical diskectomy and fusion.  3. Low back pain, significantly improved.   PLAN:  1. Continue hydrocodone as needed for severe pain.  2. Await results of myelogram and followup appointment with [name].  3. The patient is to continue home exercise program.  4. The patient is to follow up in two months or sooner as needed.  Would     actually start discussing return to work     strategies.  5. The patient was educated about findings and recommendations and     understands.  There were no barriers to communication.                                               Zachary George, DO    JW/MEDQ  D:  02/12/2002  T:  02/12/2002  Job:  [id number]   cc:   Cristi Loron, M.D.  [address]  [place]  [place] [address]  Fax: [phone]

## 2010-05-22 NOTE — Assessment & Plan Note (Signed)
Patient returns today.  She was scheduled for lumbar epidural steroid  injection; however, she states that her radicular pain down the left lower  extremity is not so bad, and it is really her coccyx pain that is bothering  her the most.  She has come off the Ultram because of an allergy.  She has  continued MS Contin 15 q.h.s.  She had her last coccyx injection in  February.   She has had no new problems reported.   PHYSICAL EXAMINATION:  GENERAL:  No acute distress.  Mood and affect flat,  otherwise appropriate.  VITAL SIGNS:  Blood pressure 124/57, pulse 66, respirations 16, O2 sat 95%  on room air.  NEUROMUSCULAR:  She is sitting on her right buttocks to relieve pressure on  her coccyx.  She has tenderness to palpation over the coccyx area.  She has  normal gait other than slow to arise from chair.   IMPRESSION:  1.  Coccydynia.  2.  Chronic left lower extremity radiculitis.   PLAN:  1.  We will inject coccyx today.  2.  We will trial hydrocodone 10/650 q.h.s. in substitution for MS Contin.      We will have her see nursing in a month and determine whether she      tolerates to hydrocodone and also get similar benefits to the morphine.      May also want to add some nortriptyline in the future.      Erick Colace, M.D.  Electronically Signed     AEK/MedQ  D:  06/17/2005 12:46:54  T:  06/17/2005 13:11:43  Job #:  [id number]

## 2010-05-22 NOTE — Assessment & Plan Note (Signed)
DATE OF SERVICE:  April 21, 2004.   MEDICAL RECORD NUMBER:  [id number].   DATE OF BIRTH:  [birth year].   [name] returns today.  I last saw her on March 30, 2004, for a left  sacroiliac joint injection under fluoroscopic guidance.  She has had good  relief with that of her left low back pain.  Her main complaint at the  current time is left lower extremity radicular pain in the foot which she  has had before.  She has had [name] S1 transforaminal epidural sternoid  injection in August of 2005.   She denies any neck pain currently.  Her pain level down the leg is 8-9.  She has pain interference scores of 7 with general activity, 5 with  relationships with other people and general life of 8.  Sleep is fair, but  she has been out of her nortriptyline.  Relief from medications is 4/10.  She can walk five minutes.  She can climb steps, drive and walk without a  cane.  She needs assistance with meal prep, household duties and shopping,  but otherwise is independent with ADLs.   REVIEW OF SYSTEMS:  Has had some weight gain and night sweats.  These are  not new problems.  No change in frequency or intensity.   MEDICATIONS:  1.  Avinza 30 mg p.o. daily.  2.  Nortriptyline.  She is out of, but was taking 25 mg.   PHYSICAL EXAMINATION:  VITAL SIGNS:  Blood pressure 135/44, pulse 74,  respirations 20, O2 saturation 96% on room air.  GENERAL APPEARANCE:  In no acute distress.  Mood and affect appropriate.  BACK:  No tenderness to palpation.  Reduced right L5 and left L4 dermatome  to pinprick.  EXTREMITIES:  She has normal strength in bilateral lower extremities.  She  has normal range of motion at the hips.  Gait without any toe drag or knee  instability.   IMPRESSION:  1.  Left sacroiliac joint arthropathy, improved after sacroiliac joint      injection.  2.  Low back pain not responsive to facet medial branch blocks.  3.  Chronic left S1 radiculopathy.  I think she has had gradual  recurrence      of her symptoms over the last seven months, but has had good relief with      S1 transforaminal on the left.  4.  Narcotic analgesic dependence, but on a fairly low dose of Avinza.   PLAN:  1.  Continue Avinza 30 mg p.o. daily.  2.  Restart nortriptyline 25 mg p.o. q.h.s.  3.  Left S1 transforaminal epidural sternoid injection under fluoroscopic      guidance.  4.  No need to repeat the SI joint injection at this time.  5.  I will see her back in approximately one month for the next injection.      AEK/MedQ  D:  04/21/2004 11:43:25  T:  04/21/2004 12:17:24  Job #:  [id number]   cc:   Patrica Duel, M.D.  [address], [address]  [place]  [place] [address]  Fax: [phone]   Cristi Loron, M.D.  [address]  [place]  [place] [address]  Fax: [phone]

## 2010-05-22 NOTE — Assessment & Plan Note (Signed)
DATE OF BIRTH:  [birth year].   AGE:  [age].   MEDICAL RECORD NUMBER:  Is #[id number].   Trigger point injection indication, lumbar spine pain in her spinous  ligament, L5-S1 area as well as left paraspinal L5 area.   Informed consent was obtained, after describing the risks and benefits of  the procedure with the patient who elected to proceed.  The patient was  placed prone on exam table.  Betadine prep.  A 25-gauge 1/4-needle was used.  First inserted to the left paraspinal muscle group trigger point area 1 cc  of 1% lidocaine was injected after negative drawback for blood, then same  equipment used for the midline injection.  Once again, 1cc of 1% lidocaine  was injected at a depth of 1-inch.  The patient tolerated the procedure  well.  Post injection instructions given.  She is to return in two months.       AEK/MedQ  D:  11/01/2003 14:32:54  T:  11/01/2003 17:40:02  Job #:  [id number]
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
## 2010-05-22 NOTE — Procedures (Signed)
NAME:  CALEESI, KOHL NO.:  [id number]   MEDICAL RECORD NO.:  [id number]                   PATIENT TYPE:  REC   LOCATION:  [facility]                                  FACILITY:  [facility]   PHYSICIAN:  Erick Colace, M.D.           DATE OF BIRTH:  [birth year]   DATE OF PROCEDURE:  05/30/2003  DATE OF DISCHARGE:                                 OPERATIVE REPORT   PATIENT:  [name]   DATE OF BIRTH:  1965-06-12   MEDICAL RECORD NUMBER:  [id number]   Ms. Stupka returns today for a left S1 transforaminal epidural steroid  injection.  She has had a previous injection in December, feels like it has  worn off, she has had increasing left foot pain.  No new issues.  No new  medications.   Patient informed consent was obtained after describing risks and benefits of  the procedure to the patient.  Risks included bleeding, bruising, infection,  temporary or permanent bowel/bladder dysfunction, paralysis, loss of  sensation.   Patient gives written consent and would like to proceed.   PROCEDURE:  Patient placed prone on fluoroscopy table, Betadine prep with  sterile drape, 25-gauge 1-1/4 inch needle was utilized to infiltrate skin  and subcu tissues.  Patient then had 22-gauge 3-1/2 inch spinal needle  manipulated in the left S1 foramina under fluoroscopic guidance, both AP and  lateral images were utilized to assess proper location then a 1 mL aliquant  of Omnipaque 180 was injected after negative drawback for blood demonstrated  no evidence of intravascular uptake then a solution containing 1 mL of 40  mg/mL Kenalog and 2 mL of 1% lidocaine were injected.  Patient tolerated  procedure well, post-injection instructions given.                                                Erick Colace, M.D.    AEK/MEDQ  D:  05/30/2003 15:20:22  T:  05/31/2003 07:45:38  Job:  [id number]

## 2010-05-22 NOTE — Consult Note (Signed)
[facility]  Patient:    [name], [name] Visit Number: [id number] MRN: [id number]          Service Type: PMG Location: [facility] Attending Physician:  Rolly Salter Dictated by:   Sondra Come, D.O. Proc. Date: 12/12/00 Admit Date:  11/21/2000                            Consultation Report  HISTORY OF PRESENT ILLNESS:  The patient returns to clinic today as scheduled for reevaluation and trigger point injections to the right upper back and parascapular musculature.  The patient continues to have significant pain in her neck, right upper back, into her right thoracolumbar region and to her lumbar spine.  She continues to work.  Her pain today is 9/10 on a subjective scale.  Her function and quality of life indices remain somewhat declined. Her sleep is improved with Pamelor 10 mg at bedtime.  The patient was started on Bextra 10 mg at last visit without any significant relief.  She did not have any adverse reactions to the Bextra.  She continues to take tramadol just as needed.  The patient does not like taking medications.  She denies any new neurologic complaints.  She continues to have intermittent pain radiating into her right upper extremity.  Occasionally with some radiating pain into her right lower extremity but none now.  I review health and history form and 14-point review of systems.  PHYSICAL EXAMINATION:  GENERAL:  A healthy female in no acute distress.  VITAL SIGNS:  Blood pressure 124/69, pulse 62, respirations 16, O2 saturation 99%.  MUSCULOSKELETAL:  Full range of motion of the cervical and lumbar spines. Palpatory examination reveals tenderness to palpation diffusely in the lumbar paraspinals and right thoracolumbar paraspinals.  There are trigger points noted in the right upper trapezius, levator scapula, infraspinatus, and rhomboids.  Manual muscle testing is 5/5 bilateral upper and lower extremities.  Sensory exam is intact to  light touch bilateral upper and lower extremities at this time.  Muscle stretch reflexes are 2+/4 bilateral upper and lower extremities.  Spurling maneuver is negative on the right.  Straight leg raise is negative bilaterally.  The patient is noted to have significant tight hamstrings and hip flexor muscles.  There is also a mild lower right hemipelvis in the standing position.  This is corrected with a 1/4 inch lift.  IMPRESSION: 1. Cervicalgia, status post C4-5 anterior cervical diskectomy with myofascial    component with above-noted trigger points. 2. Mechanical low back pain with poor lower extremity flexibility.  PLAN: 1. Trigger point injections to the right trapezius, infraspinatus, levator    scapula, and rhomboids.  The procedure was described to the patient in    detail including risks, benefits, limitations, and alternatives.  The    patient wishes to proceed and informed consent was obtained.  Skin was    prepped in usual sterile fashion.  Each trigger point was injected with    1 cc of 1% lidocaine using a 25-gauge 1-1/2 inch needle without    complications.  Twitch responses were noted in the right upper trapezius,    infraspinatus, and rhomboids.  The patient tolerated the procedure well.    No complications.  Discharge instructions given.  The patient to use heat    immediately when she returns home and ice afterwards if needed. 2. We will increase Bextra to 20 mg p.o. q.d. 3. Continue  Ultram as needed. 4. The patient instructed on a lower extremity stretching program and    encouraged to continue her lumbar stabilization exercise program, which she    has been prescribed in physical therapy.  I counseled the patient on proper    body mechanics. 5. The patient to return to clinic in two weeks for reevaluation.  We will    consider further trigger point injections versus other interventional    procedures to assist with pain relief and facilitate exercise  program.  The patient was educated on the above findings and recommendations and understands.  There were no barriers to communication. Dictated by:   Sondra Come, D.O. Attending Physician:  Rolly Salter DD:  12/12/00 TD:  12/12/00 Job: [id number]

## 2010-05-22 NOTE — Assessment & Plan Note (Signed)
Since last visit has seen [name] who does not feel surgical  treatment is indicated. She continues to have back pain but more  significant her tailbone pain has come back. This is effecting her in  sitting position mainly. Rating her pain as an 8 out of 10. Her sleep is  fair. Relief from meds is fair. She can walk 5 minutes at a time, climb  steps and drives. She needs some help with meal prep, household duties,  shopping.   REVIEW OF SYSTEMS:  Negative except for some problem walking related to  her leg pain which is chronic.   Her blood pressure is 117/61, pulse 70, respirations 16, O2 sat 97% in  room air.  IN GENERAL: In no acute distress, mood and affect appropriate.   Her back has no tenderness to palpation other than the coccyx. She has  fair flexion/extension of lumbar spine. Her gait is normal.   IMPRESSION:  1. Lumbar radiculitis, secondary to lumbar degenerative disc, facet      arthropathy, plan for this can be Lyrica 150 b.i.d., MS Contin 15      mg b.i.d., and Ultram 100 t.i.d.  2. Coccydynia, will inject today.   ADDENDUM:  This is a coccygeal injection done chaperoned by RN .  Informed consent was obtained, after describing risks and benefits of  the procedure, she elected to proceed. Area marked with and prepped with  Betadine, 25 gauge inch and a half  needle was used, inserted bone  contact, syringe containing one half mL of 40 mg/mL Kenalog plus 4 mL of  l% lidocaine were injected. Patient tolerated procedure well, pre, post  injection instructions given.      Erick Colace, M.D.  Electronically Signed     AEK/MedQ  D:  02/14/2006 09:34:50  T:  02/14/2006 10:12:10  Job #:  [id number]

## 2010-05-22 NOTE — Assessment & Plan Note (Signed)
MEDICAL RECORD NUMBER:  [id number].   REASON FOR VISIT:  Low back pain. Last visit was December 08, 2004. She  states that around Christmas she bent forward to pick something up and felt  a pull in her back, and she has had increased back pain since that time. She  had problems walking for several days but now that has subsided, and overall  she is doing a bit better. Last visit, we reduced her MS Contin from 15  b.i.d. to 15 nightly based on some memory problems that she was ascribing to  the MS Contin. She continues on nortriptyline 25 p.o. nightly. She was  started on Ultram 50 t.i.d. but does not feel it has helped a whole lot.   She feels that her memory problems are better since the MS Contin was  reduced to h.s. dosing.   Gives pain score of 9 right now, averaging 8, 40% relief with medications.  Not employed. Needing assistance with her household duties. She is  complaining of some constipation.   PHYSICAL EXAMINATION:  VITAL SIGNS:  Blood pressure 122/76, pulse 86,  respiratory rate 16, O2 saturation 100% on room air.  GENERAL:  Well-developed, well-nourished female in no acute distress. Back  has tenderness to palpation just superior to the PSIS on the right side.  Pain is exacerbated by bending forward, and she has limited forward flexion  and extension as well. Gait is without any toe drag or knee instability.   IMPRESSION:  Lumbar pain. Gives a history suggestive of a small annular  tear. At this point, I think she can benefit from addition of anti-  inflammatories. For that reason, we will start on Celebrex 400 mg first dose  and then 200 b.i.d. after that for a total of 7 days. I will see her back in  two to three weeks for trigger point injection if the pain is persisting.   Of note is that she has been on Bextra in the past with tolerance of this  medication.   We will continue her MS Contin at 15 mg nightly and continue nortriptyline  25 nightly and increase Ultram  to 50 two tablets t.i.d.      Erick Colace, M.D.  Electronically Signed     AEK/MedQ  D:  01/07/2005 13:29:13  T:  01/07/2005 15:10:15  Job #:  [id number]

## 2010-05-22 NOTE — Procedures (Signed)
[facility]  Patient:    NIKAYLA, MADARIS Visit Number: [id number] MRN: [id number]          Service Type: Attending:  Jewel Baize. Stevphen Rochester, M.D. Dictated by:   Jewel Baize Stevphen Rochester, M.D. Proc. Date: 04/11/01                             Procedure Report  Anivea Velasques comes to [facility].  We are scheduled for facetal injection.  Risks, complications, and options are fully outlined.  She wishes to proceed.  1. Her pain is primarily lateralizing to the right side, and we will inject    the facet of C4, C5, C6, and C7 at independent needle access points.    Contributory innervation will be addressed.  She understands to assess [place]    within the context of activities of daily living.  Positive provocative    block leads to consideration of radiofrequency and neural ablation.  2. Health and history form reviewed.  A 14-point review of systems.  Do not    believe diagnostics or imaging are warranted.  Objectively, she has diffuse paracervical myofascial discomfort, impaired flexion, extension, and lateral rotation pain positive.  Cervical facetal compression test, right greater than left.  She has no new neurological features, motor, sensory, or reflexes.  IMPRESSION: 1. Cervicalgia. 2. Degenerative spinal disease of cervical spine and cervical facet syndrome.  PLAN:  Cervical facet injection C4,5,6,7.  Contributory innervation addressed. She has consented.  DESCRIPTION OF PROCEDURE:  The patient taken to the fluoroscopy suite and placed in supine position, neck prepped and draped in the usual fashion.  We Using a 25 gauge needle, I advance to the facet of C4, C5, C6, and C7 with contributory innervation addressed.  I confirm placement on multiple fluoroscopic positions and then inject 0.5 cc of lidocaine and 1% MPF at each level with a total of 40 mg of Aristocort in divided dose.  Appropriately recovered, and no complication from our  procedure.  We will assess this within the context of activities of daily living.  Lifestyle enhancements discussed such as cigarette cessation, review of medications. She is appropriate.  Discharge instructions given.  Follow up in approximately one month for reassessment. Dictated by:   Jewel Baize Stevphen Rochester, M.D. Attending:  Jewel Baize. Stevphen Rochester, M.D. DD:  04/11/01 TD:  04/11/01 Job: [id number]

## 2010-05-22 NOTE — Procedures (Signed)
[facility]  Patient:    [name], [name] Visit Number: [id number] MRN: [id number]          Service Type: PMG Location: [facility] Attending Physician:  Rolly Salter Dictated by:   Jewel Baize Stevphen Rochester, M.D. Proc. Date: 01/17/01 Admit Date:  11/21/2000                             Procedure Report  PROCEDURE:  SURGEON:  Hans C. Stevphen Rochester, M.D.  INDICATIONS:  The patient comes to enter pain management today.  I evaluated and reviewed health and history form, 14 point review of systems.  I reviewed the chart, reviewed Dr. Julien Nordmann notes, and examined the patient.  We are scheduled in consult for cervical epidural.  Objectively, she has diffuse paracervical myofascial discomfort, impaired flexion and extension, lateral rotational pain, positive cervical and facetal compression test, left greater than right.  No new neurological features, motor, sensory, or reflexes.  IMPRESSION: 1. Cervicalgia. 2. Degenerative spinal disease of cervical spine.  PLAN:  Cervical epidural.  She has consented to fluoroscopically guided.  DESCRIPTION OF PROCEDURE:  The patient was taken to the fluoroscopy suite and placed in the prone position.  Appropriate fluoroscopic anatomy identified. The neck was then prepped and draped in the usual fashion.  Using a Hustead needle, advanced to the C5-6 interspace without any evidence of CSF, heme, or paresthesia.  Test block uneventful followed by 40 mg of Aristocort and flushed the needle.  Tolerated the procedure well.  No complications from our procedure.  Discharge instructions given. Dictated by:   Jewel Baize Stevphen Rochester, M.D. Attending Physician:  Rolly Salter DD:  01/17/01 TD:  01/18/01 Job: [id number]

## 2010-05-22 NOTE — Assessment & Plan Note (Signed)
The patient was last seen by me on February 25, 2005. He had a coccygeal  injection at that time which has resulted in improvement in coccygeal pain.  She has been on morphine plus Ultram about two days after date of procedure.  She developed a skin rash which improved and then recurred. She has been  getting some allergy testing and was taken off Ultram for this reason and  remains off this. She was off morphine for a couple of weeks after this, but  now back on. Her pain level is 8/10. She is developing radicular discomfort  in her left lower extremity. Her last S1 transforaminal epidural steroid  injection was October 2006.   She can walk five to six minutes at a time. She uses a cane at times, but  drove here and did not use a cane. She has numbness and tingling in the  lower extremity and constipation.   PHYSICAL EXAMINATION:  On general exam, she is in no acute distress. Mood  and affect appropriate. Blood pressure 130/82, pulse 86, respirations 16, O2  saturation 99% on room air. She gets up slowly, but when she starts walking  she starts to loosen up.  Her back is nontender to palpation. She has no toe  drag with ambulation. She has normal strength in the lower extremity but  decreased left S1 reflex.   IMPRESSION:  1.  Chronic left S1 radiculopathy and recurrence. She has received      prednisone orally for a month and therefore I want to hold out for a      couple of months to do a repeat S1 transforaminal epidural. She has been      off prednisone for about one month now.  2.  Continue MS Contin 15 mg q.h.s.  3.  Patient will resume Ultram after allergy testing completed. Would      consider resumption of nortriptyline.      Erick Colace, M.D.  Electronically Signed     AEK/MedQ  D:  04/23/2005 14:08:18  T:  04/25/2005 11:31:57  Job #:  [id number]   cc:   Cristi Loron, M.D.  Fax: [phone]
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
## 2010-05-22 NOTE — Assessment & Plan Note (Signed)
MEDICAL RECORD NUMBER:  [id number]   DATE OF BIRTH:  October 13, 1965   REASON FOR EVALUATION:  Ms. Sarver returns today.  She was last seen by me on  August 31, 2005.  She is a 45 year old female who [name] did ACDF on in  2002.  She has had chronic low back pain and had an L5-S1 disk seen on CT  myelogram ordered by [name] in 2004, had been doing well with pain  management, in fact, was down to just hydrocodone twice a day, off her long-  acting morphine, when she had a flare-up of her lower extremity pain.  She  has had no bowel or bladder dysfunction and she has had no leg weakness.  She has had no change in her current medical illnesses.   Her pain is rated at an 8/10.  Sleep is fair.  Pain relief with medications  is about 60%; we did increase her morphine sulfate 15 mg to b.i.d. last  visit and she continues on Ultram two p.o. t.i.d.  She needs some assistance  with meal prep, household duties and shopping.   REVIEW OF SYSTEMS:  Positive for lower extremity numbness, tingling and some  trouble walking, and some confusion.   OTHER PHYSICIANS CURRENTLY TREATING:  Dr. Gerilyn Pilgrim from ENT diagnosed her with  TMJ.   PHYSICAL EXAMINATION:  VITAL SIGNS:  Her blood pressure is 119/76, pulse 78,  respirations 18, O2 SAT 96% on room air.  BACK:  There is mild tenderness in the lumbosacral junction and paraspinals.  She has limited range of motion, forward flexion greater than extension in  terms of limitations.  NEUROMUSCULAR:  She is able to toe walk and heel walk.  She has good  strength in the lower extremities.  She has normal deep tendon reflexes as  well as intact sensation to light touch.   IMPRESSION:  Exacerbation of chronic low back pain and has a history of L5-  S1 disk.  She may have had some progression of this, certainly no evidence  of cauda equina syndrome, but given that her symptoms have not let up for  over a month, I would proceed with lumbar MRI and send her over  to see Dr.  [name].  We did try to send her last month, but she states she has not  gotten a call back from the office.  We will send this through again and I  also asked the patient herself to call.   PLAN:  1. Continue Ultram 50 mg two p.o. t.i.d.  2. Continue morphine sulfate IR 15 mg b.i.d.  May convert to MS Contin 15      mg b.i.d. next visit if not much better and hold off on Neurontin      because of history of rash.  May trial some Lyrica.  She did have some      improvement of her leg pain with Medrol Dosepak.  3. Avoid NSAIDS, given intolerance to Advil.      Erick Colace, M.D.  Electronically Signed     AEK/MedQ  D:  09/28/2005 12:49:39  T:  09/30/2005 10:35:47  Job #:  [id number]   cc:   Cristi Loron, M.D.  Fax: [phone]
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
## 2010-05-22 NOTE — Consult Note (Signed)
NAME:  [name], [name]                         ACCOUNT NO.:  [id number]   MEDICAL RECORD NO.:  [id number]                   PATIENT TYPE:  REC   LOCATION:  [facility]                                  FACILITY:  [facility]   PHYSICIAN:  Zachary George, DO                      DATE OF BIRTH:  16-Dec-1965   DATE OF CONSULTATION:  12/21/2001  DATE OF DISCHARGE:                                   CONSULTATION   HISTORY OF PRESENT ILLNESS:  The patient returns to the clinic today for  reevaluation.  She was last seen on November 06, 2001.  She continues to  complain of severe neck and right upper back pain with pain radiating into  her left lateral arm, radial forearm, to all of her fingers on occasion.  She has had acupuncture without any relief.  She has been off work since  November 14, 2001.  She is status post C4-5 anterior cervical diskectomy and  fusion and was doing fairly well previously but has had significant  exacerbation over the last few months.  Her pain today is an 8/10 on a  subjective scale.  She is taking Celebrex daily which seems to be helping to  some degree.  She is also taking Effexor which she feels is helping her  depression modestly.  She does not really tolerate narcotic-based pain  medication, and we have tried various medications including Norco and  Mepergan.  She has taken NEURONTIN in the past and had an allergic reaction.  I reviewed the health and history form and 14-point review of systems.  I  discussed strategies in regards to work.  However, the patient states she  cannot go back to work on any type of light duty and has to be 100% before  returning.   PHYSICAL EXAMINATION:  VITAL SIGNS:  Her blood pressure today is 127/53,  pulse 82, respirations 20, O2 saturation 92% on room air.  NEUROLOGIC:  Manual muscle testing is 5/5 bilateral upper extremities.  Sensory exam intact to light touch bilateral upper extremities.  Spurling  maneuver is equivocal on the right,  negative on the left.  Muscle stretch  reflexes are 2+/4 bilateral biceps, left triceps, 1+/4 bilateral  brachioradialis and pronator teres, and 0/4 right triceps.   IMPRESSION:  1. Cervicalgia, status post C4-5 anterior cervical diskectomy and fusion.     Cannot conclusively rule out recurrent disk herniation.  2. Low back pain, significantly improved.   PLAN:  1. Increase Celebrex to 200 mg b.i.d., #60 with two refills.  2. Will give the patient a trial of Zonegran 100 mg 1 p.o. q.d., #14 sample-     pack given.  Patient instructed to call our clinic in two weeks prior to     running out of medication to report its efficacy.  3. Will obtain an MRI of the cervical spine with  and without contrast to     rule out stenosis and herniated disk.  4. Will continue out of work until January 21, 2002.  5. Patient to return to clinic after MRI.   The patient was educated on the above findings and recommendations and  understands.  There were no barriers to communication.                                               Zachary George, DO    JW/MEDQ  D:  12/21/2001  T:  12/22/2001  Job:  [id number]   cc:   Cristi Loron, M.D.  [address]  [place]  [place] [address]  Fax: [phone]

## 2010-05-22 NOTE — Assessment & Plan Note (Signed)
REFERRING PHYSICIAN:  Cristi Loron, M.D.   [name] returns today after I last saw her February 19, 2003, in the  interval.  She has undergone cholecystectomy.  She states that she had  episodes of nausea and vomiting but without fever and has had relief of  these symptoms since surgery.   She has had no other new medical history in the interval.  She was to have  trigger point injections done, however, because of her hospitalization,  these were not done when scheduled.  There was a laparoscopic  cholecystectomy done March 14, 2003, at [facility], Dr. Malvin Johns.  Pain level 8/10 mainly in the neck, had some low back pain and minimal left  lower extremity pain.   He has smoked a half a pack a day x11 years.   REVIEW OF SYMPTOMS:  Positive for blurred vision, poor sleep, headaches.  In  fact, her headaches have gotten worse.  They come from her neck.   The patient has had no suicidal ideation.   PHYSICAL EXAMINATION:  GENERAL APPEARANCE:  No acute distress.  Mood and  affect appropriate.  VITAL SIGNS:  Blood pressure 116/58, pulse 79, O2 saturation 97% on room  air.  NECK:  Neck has some tenderness to palpation bilateral traps.  She has good  neck range of motion in terms of lateral rotation, flexion/extension  approximately 50% range.  BACK:  Full forward flexion extension about 50%, lateral rotation and  bending 50%.  EXTREMITIES:  She has full strength bilateral upper and lower extremities.  No pain with palpation bilateral upper and lower extremities.  Full range of  motion bilateral upper extremities.  Normal deep tendon reflexes bilateral  upper and lower extremities.   IMPRESSION:  1. Cervicalgia.  History of cervical post laminectomy syndrome.  2. History of left S1 radiculopathy quiescent.  3. Myofascial pain syndrome with cervicogenic headaches.   PLAN:  1. Will schedule for trigger point injection, consider Botox for longer     lasting effect.  2.  Continue current medications including __________ 60 mg p.o. daily, Mobic     7.5 p.o. daily, and nortriptyline 20 mg p.o. q.h.s. Will do urine drug     screen today.   See her back for the trigger points in one to two weeks.      [name], M.D.   AEK/MedQ  D:  04/12/2003 10:33:01  T:  04/12/2003 11:31:12  Job #:  [id number]   cc:   Cristi Loron, M.D.  [address]  [place]  [place] [address]  Fax: [phone]

## 2010-05-22 NOTE — Procedures (Signed)
NAME:  [name], [name]               ACCOUNT NO.:  [id number]   MEDICAL RECORD NO.:  [id number]          PATIENT TYPE:  OUT   LOCATION:  RAD                           FACILITY:  [facility]   PHYSICIAN:  Erick Colace, M.D.DATE OF BIRTH:  1965-06-01   DATE OF PROCEDURE:  DATE OF DISCHARGE:  10/15/2005                                 OPERATIVE REPORT   Tuesday, November 23, 2005   Date of last visit was September 28, 2005.   A 45 year old female who had an ACDF per [name] in 2002, chronic back  pain, and L5 disk seen on a CT myelogram in 2004.  Has had some exacerbation  of low back pain.  She has also had the left lower extremity pain, received  temporary relief with transforaminal epidural steroid injections.  She has  had her last injection done on June 17, 2005.  This was a coccyx injection,  given that she had pain there at that time.  Her last S1 transforaminal was  October 26, 2004.  Of note is that she has had three prior S1  transforaminals.   In terms of pain score, this is an 8/10.  Sleep is fair.  Relief with meds  is about 50%.  Her pain is worse with activity, she says walking, bending,  sitting, and standing.  She does not work outside the house and needs some  help with shopping and household duties.  She has some numbness in her left  lower extremity.   No new other medical problems since I last saw her, but she has undergone a  diskogram after seeing [name] on October 19, 2005.   PHYSICAL EXAMINATION:  VITAL SIGNS:  Blood pressure 160/61, pulse 77,  respirations 16, O2 sat 95% on room air.  BACK:  Tenderness to palpation.  She is able to bend forward and bend  backwards with partial range, approximately 50%.  She has good strength in  her upper and lower extremities and normal deep tendon reflexes with  distraction.   Lumbar degenerative disk:  Her last MRI demonstrated just an annular tear,  L4-5, more on the right paracentral.  She also has some facet  arthropathy at  L5-S1 and mild biforaminal narrowing.   I think it is reasonable at this point to put her on some Lyrica.  She has  had some intolerance of Neurontin but no overt allergy.  We will start low  dose samples, 75 b.i.d., and work up to 150 b.i.d. over two weeks time.  We  will switch her MSIR to MS Contin 50 mg b.i.d.  She will follow up with Dr.  [name] for the diskogram.  I did read the E-chart report, concordant pain  to L3-4, discordant L4-5, and discordant L5-S1.  Her disk at L3-4 looked  morphologically normal.   Consider S1 injection the next time I see her.   Will also continue Ultram 50 mg 2 p.o. t.i.d.      Erick Colace, M.D.  Electronically Signed     AEK/MEDQ  D:  11/23/2005 10:22:50  T:  11/23/2005  11:56:38  Job:  [id number]   cc:   Cristi Loron, M.D.  Fax: [phone]
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
## 2010-05-22 NOTE — Consult Note (Signed)
[facility]  Patient:    [name], DAVIA Visit Number: [id number] MRN: [id number]          Service Type: Attending:  Sondra Come, D.O. Dictated by:   Sondra Come, D.O. Proc. Date: 05/11/01   CC:         Cristi Loron, M.D.   Consultation Report  HISTORY OF PRESENT ILLNESS:  Ms. Holwerda returns to clinic today as scheduled for reevaluation.  She was last seen by me on January 11, 2001.  In the interim, she has been seeing Dr. Stevphen Rochester and has undergone three cervical epidural steroid injections which she states gave her essentially no relief. She also has undergone right cervical facet blocks which she states gave her 100% relief from the time she left our clinic for approximately three to four days.  Review of the procedure reveals that Dr. Stevphen Rochester used 1% preservative free lidocaine with 40 mg of Aristocort in divided doses.  Currently Ms. Case continues to have neck and right upper back pain with occasional right upper extremity radicular symptoms as well as low back pain with occasional right lower extremity radicular symptoms.  Her pain is an 8/10 on a subjective scale.  I review the health and history form, and 14-point review of systems. Currently patients pain is mainly in her low back.  I review the chart again.  There is an MRI of the lumbar spine from April of 2002, which reveals degenerative changes at the L4-5 and L5-S1 disks with an annular tear at L4-5.  The patient denies any increased low back pain with coughing, sneezing, or bowel movement, however.  She does have some pain with range of motion of her lumbar spine including extension and flexion.  She notes increased pain after sitting for a prolonged period of time.  She continues to take Bextra 20 mg b.i.d. and Ultram as needed for breakthrough pain.  She does have prescription for Mepergan Fortis from last visit with Dr. Stevphen Rochester, which she takes for severe pain.  This tends to  make her foggy.  She states she took one last night and this morning she is having a difficult time "comprehending" as she puts it.  PHYSICAL EXAMINATION:  GENERAL:  Health female in no acute distress.  VITAL SIGNS:  Blood pressure 109/65, pulse 70, respirations 16, O2 saturation is 99% on room air.  BACK:  Level pelvis without scoliosis and normal lumbar lordosis.  Normal cervical lordosis.  Range of motion of the lumbar spine is limited in all planes secondary to pain with extension and flexion primarily.  Cervical range of motion is guarded but without significant pain today.  NEUROLOGIC:  Manual muscle testing is 5/5 bilateral upper and lower extremities.  Sensory examination is intact bilateral upper and lower extremities in all dermatomal distributions with the exception of mild decrease to light touch in the medial right leg.  Muscle stretch reflexes are 2+/4 bilateral upper extremities, 1+/4 bilateral patellar, medial hamstrings, and Achilles.  There is mild tenderness to palpation in the right periscapular muscles.  IMPRESSION: 1. Cervicalgia with findings consistent with facet mediated pain. 2. Cervical facet syndrome. 3. Status post C4-5 anterior cervical diskectomy with fusion. 4. Low back pain, likely discogenic.  PLAN: 1. I had a long discussion with Ms. Keeny regarding treatment options.  This    was an extensive consultation, greater than 25 minutes. 2. Based on positive response from facet blocks/medial branch blocks in the    cervical spine, the  patient is likely a candidate for radiofrequency    neurotomy and I discussed this with her.  She wishes to consider this.  She    wants to followup with [name] before making this decision. 3. Continue to perform home exercise program, especially in terms of her    lumbar stabilization exercises.  We also discussed possible further    intervention in regards to her presumed discogenic low back pain.  Could     consider doing a diagnostic discogram.  I am unsure of whether or not the    patient would be a candidate for some type of intradiscal procedure.  I    have asked her to bring her MRI of her lumbar spine with her at next visit.    I would also consider repeating the MRI since hers is over a year old. 4. Continue Bextra 20 mg b.i.d. and a new prescription written for #60 with    one refill. 5. Discontinue Mepergan Fortis as it has given her considerable cognitive side    effects.  Will re-initiate Norco 10 mg/325 mg one-half to one p.o. t.i.d.    as needed, #30 without refills. 6. Continue Ultram as needed for primary pain control. 7. The patient is to followup with [name] as scheduled. 8. The patient is to return to clinic in one month for reevaluation, or sooner    as needed.  The patient is instructed that if she decides to proceed with    radiofrequency procedure to call us and we will get her on the schedule for    this.  The patient was educated on the above findings and recommendations, and understands.  There were no barriers to communication. Dictated by:   Sondra Come, D.O. Attending:  Sondra Come, D.O. DD:  05/11/01 TD:  05/13/01 Job: [id number]

## 2010-05-22 NOTE — Procedures (Signed)
NAME:  [name], [name]               ACCOUNT NO.:  [id number]   MEDICAL RECORD NO.:  [id number]          PATIENT TYPE:  REC   LOCATION:  [facility]                          FACILITY:  [facility]   PHYSICIAN:  Erick Colace, M.D.DATE OF BIRTH:  1965-07-16   DATE OF PROCEDURE:  03/30/2004  DATE OF DISCHARGE:                                 OPERATIVE REPORT   DATE OF SERVICE:  March 30, 2004.   MEDICAL RECORD NUMBER:  [id number].   PROCEDURE:  Left sacroiliac joint injection under fluoroscopic guidance.   INDICATIONS:  Back and hip pain with no significant relief following two  lumbar facet medial branch injections.   DESCRIPTION OF PROCEDURE:  Informed consent was obtained after describing  the risks and benefits of the procedure with the patient.  These included  bleeding, bruising, loss of bowel and bladder function and temporary,  permanent, partial or complete paralysis.  She elects to proceed.  The  patient was placed prone on the fluoroscopy table.  Betadine prep and  sterile drape.  A 25 gauge 1-1/2 inch needle was used to anesthetize the  skin and subcutaneous tissues with 1% lidocaine.  Then a 22 gauge 3-1/2 inch  spinal needle was inserted under fluoroscopic guidance into the left SI  joint.  The needle location was confirmed with both AP and lateral imaging.  Then Omnipaque 180 under live fluoroscopy x 0.5 mL demonstrated no  intravascular uptake.  Then a solution containing 0.5 mL of 40 mg/mL on  Kenalog plus 0.5 mL of 2% methylparaben-free lidocaine were injected.  The  patient tolerated the procedure well.  Post injection instructions given.  Preinjection pain level 4/10 and post injection 0/10.  Will follow up in  three weeks and further assess relief given her pain diary.      AEK/MEDQ  D:  03/30/2004 09:49:07  T:  03/30/2004 10:56:16  Job:  [id number]

## 2010-05-22 NOTE — Consult Note (Signed)
[facility]  Patient:    JASMANE, BROCKWAY Visit Number: [id number] MRN: [id number]          Service Type: PMG Location: [facility] Attending Physician:  Rolly Salter Dictated by:   Sondra Come, D.O. Proc. Date: 12/26/00 Admit Date:  11/21/2000                            Consultation Report  Ms. Oftedahl returns to clinic as scheduled for reevaluation.  She was last seen on December 12, 2000 at which time she received trigger points into the right upper back with approximately 30% relief x 3 days.  Currently, her pain is 7/10 on a subjective scale.  Her function and quality of life has remained declined, although it has improved somewhat after the trigger point injections.  Her sleep is fair to poor.  She continues to take Bextra 20 mg daily, Pamelor 10 mg at nighttime with improved sleep, and Ultram 50 mg p.r.n. Patient denies any new neurologic symptoms.  I review health and history form and 14 point review of systems.  PHYSICAL EXAMINATION  GENERAL:  Healthy female in no acute distress.  VITAL SIGNS:  Blood pressure 122/68, pulse 66, respirations 16, O2 saturation 98% on room air. Dictated by:   Sondra Come, D.O. Attending Physician:  Rolly Salter DD:  12/26/00 TD:  12/28/00 Job: [id number]

## 2010-05-22 NOTE — Procedures (Signed)
NAME:  [name], [name]               ACCOUNT NO.:  [id number]   MEDICAL RECORD NO.:  [id number]            PATIENT TYPE:   LOCATION:                                 FACILITY:   PHYSICIAN:  Erick Colace, M.D.   DATE OF BIRTH:   DATE OF PROCEDURE:  DATE OF DISCHARGE:                                 OPERATIVE REPORT   COCCYX INJECTION   INDICATIONS:  Coccydynia only partially responsive to medication management.  Has had previous improvements with coccyx injection in February 2007, but  this has worn off as of about a month ago.   Informed consent obtained.  Patient placed on table in a prone position.  [name], R.N., assisted.  Area marked and prepped with Betadine and  with 25-gauge, inch and a half needle, bone contact made.  I infiltrated  area with 1% lidocaine x4 mL mixed with 0.5 mL of 40 mg/mL Depo-Medrol.  The  patient tolerated the procedure well.  Post-injection instructions given.      Erick Colace, M.D.     AEK/MEDQ  D:  06/17/2005 12:48:22  T:  06/17/2005 13:17:13  Job:  [id number]

## 2010-05-22 NOTE — Assessment & Plan Note (Signed)
MEDICAL RECORD NUMBER:  [id number]   REASON FOR VISIT:  Low back pain and neck pain. States that neck pain is  overall well controlled at this point. She does have some back pain but this  has improved from an 8 to a 6/10 after a left sacroiliac joint injection.   CURRENT PAIN MEDICATIONS:  1.  MS Contin 15 mg b.i.d.  2.  Nortriptyline 25 p.o. q.h.s.   REVIEW OF SYSTEMS:  Positive for memory problems, feels like since she has  been on the morphine she feels a bit forgetful; in fact, had a hard time  recalling her injection on October 26, 2004.   She can walk 15 minutes at a time, climb steps, and drive. Review of systems  positive for numbness, trouble walking, spasms, confusion, weakness.   EXAMINATION:  Blood pressure 101/62, pulse 86, respiratory rate 16, O2  saturation 99% on room air.   In general, no acute distress. Mood and affect appropriate. Her back has no  tenderness to palpation. She has good forward flexion and extension. Neck  has good range of motion. No tenderness to palpation along the cervical,  thoracic, or lumbar paraspinals.   IMPRESSION:  1.  Lumbar post laminectomy syndrome.  2.  Left sacroiliac joint arthropathy, improved.  3.  Cervical post laminectomy syndrome.   PLAN:  1.  Will reduce the MS Contin to 15 mg p.o. q.h.s.  2.  Start Ultram 50 mg p.o. t.i.d.  3.  Nortriptyline 25 q.h.s. May need to reduce that to 10 should mental      fogginess continue.      Erick Colace, M.D.  Electronically Signed     AEK/MedQ  D:  12/08/2004 57:32:20  T:  12/08/2004 15:36:53  Job #:  [id number]   cc:   [name], Dr.

## 2010-05-22 NOTE — Assessment & Plan Note (Signed)
HISTORY:  Mrs. Genson returns today after I last saw her on November 01, 2003.  She had a trigger point injection, left paraspinal L5 and then L5-S1 inter-  spinous ligament.  She had no significant relief with the injection.  She  continued to have mainly low back pain.  She has intermittent neck pain, but  this is certainly not as bad as low back pain.  She has some problems  related to the pain, in terms of sleep.  She has some headaches as well.  Sleep initiation is a real major problem.  No suicidal ideation.   SOCIAL HISTORY:  Smokes 1/4 pack per day with a 15-year-smoking-history.  Working since November 3.   PHYSICAL EXAMINATION:  VITAL SIGNS:  Blood pressure 115/68, pulse 83,  respirations 14, O2 saturation 98% on room air.   GENERAL:  Gait is a bit stiff when she gets up, otherwise no evidence toe  drag or knee instability.  Affect is flat, but she does smile after awhile,  depending upon the conversation.  Appearance is otherwise normal.   NEUROLOGIC:  She has normal range of motion in the bilateral lower  extremities.  She has good forward flexion.  Extension causes pain in the L5-  S1 midline area.  She has normal deep tendon reflexes in the bilateral lower  extremities.  The remainder of the neuromuscular examination of the lower  extremities is normal.   IMPRESSION/PLAN:  1.  Low back pain, rule out facet arthropathy.  Lumbar medial branch blocks.  2.  Chronic low back pain as well as cervical post-laminectomy syndrome.      Continue Avinza daily.  3.  Poor sleep initiation, particularly. Add trazodone 50 mg q.h.s., reduce      nortriptyline to 25 mg q.h.s.      AEK/MedQ  D:  01/20/2004 11:15:38  T:  01/20/2004 11:31:23  Job #:  [id number]

## 2010-05-22 NOTE — Consult Note (Signed)
NAME:  [name], [name]                         ACCOUNT NO.:  [id number]   MEDICAL RECORD NO.:  [id number]                   PATIENT TYPE:  REC   LOCATION:  [facility]                                  FACILITY:  [facility]   PHYSICIAN:  Sondra Come, D.O.                 DATE OF BIRTH:  [birth year]   DATE OF CONSULTATION:  11/06/2001  DATE OF DISCHARGE:                                   CONSULTATION   REASON FOR CONSULTATION:  The patient returns to clinic today, sooner than  scheduled, secondary to exacerbation of her neck pain and upper back pain.  She denies any trauma or other event leading to the exacerbation.  She  denies any radicular symptoms in her upper extremities.  She states that she  had missed two days of work last week secondary to the pain.  She has been  using the neuromuscular stimulator for about one week, which feels good  while she is wearing it, but when she takes it off, she states that the pain  increases.  She rates her pain as a 9/10 on a subjective scale and continues  to complain of poor sleep, despite Pamelor 20 mg at bedtime.  She has also  started the Lidoderm patches which have not given her any improvement.  She  again states that she feels somewhat depressed.  She has tried to use some  hydrocodone, which she had left over, over the past week or so and she  states that it has not helped.  I reviewed health and history form and 14-  point review of systems.   PHYSICAL EXAMINATION:  GENERAL:  Physical examination reveals a healthy-  appearing female in no acute distress, although she is tearful on occasion  during the exam.  VITAL SIGNS:  Blood pressure is 117/57, pulse 79, respirations 18, O2  saturation 98% on room air.  BACK:  Examination of the back reveals flattened thoracic kyphosis with  normal cervical lordosis.  There is tenderness to palpation in bilateral  cervical and thoracic paraspinous muscles, reproducing the patient's  symptoms.  NEUROLOGIC:   Neurologically intact in the upper extremities including motor,  sensory and reflexes.   IMPRESSION:  1. Cervicalgia.  2. Myofascial pain syndrome.  3. Status post C4-5 anterior cervical diskectomy with fusion.  4. Low back pain, improved.  5. Depression.   PLAN:  1. Will begin Celebrex acute pain pack, 400 mg now, followed by 200 mg if     symptoms are not improved, then 200 mg one p.o. b.i.d.  The patient was     provided with an acute pain pack plus 15 sample pills.  Also at that     point, will decrease the dose to 200 mg daily and a prescription for #30     with one refill was provided.  2. Increase Pamelor to 30 mg at bedtime.  3. Will  begin Effexor 37.5 mg one p.o. b.i.d., #60 with one refill.  4. Instruct the patient on the use of ice and heat intermittently.  5. Continue with neuromuscular stimulator.  6. Patient to return to clinic in two months for reevaluation or sooner if     needed.   The patient was educated in the above findings and recommendations and  understands.  There were no barriers to communication.                                              Sondra Come, D.O.   JJW/MEDQ  D:  11/06/2001  T:  11/07/2001  Job:  [id number]

## 2010-05-22 NOTE — Procedures (Signed)
NAME:  [name], [name]               ACCOUNT NO.:  [id number]   MEDICAL RECORD NO.:  [id number]          PATIENT TYPE:  REC   LOCATION:  [facility]                          FACILITY:  [facility]   PHYSICIAN:  Erick Colace, M.D.DATE OF BIRTH:  Mar 24, 1965   DATE OF PROCEDURE:  02/03/2004  DATE OF DISCHARGE:                                 OPERATIVE REPORT   DATE OF BIRTH:  1965/03/02.   PROCEDURES:  Bilateral L5 dorsal ramus injection, left L4 medial branch  block, and bilateral L3 medial branch block under fluoroscopic guidance.   INDICATIONS:  Back pain exacerbated by extension, only partially responsive  to narcotic analgesic and physical therapy.   Informed consent was obtained after describing the risks and benefits of the  procedure.  The patient was premedicated with 10 mg of Valium prior to the  procedure.   Understands that bleeding, bruising, infection, loss of bowel and bladder  function are a possibility.  She elects to proceed.   Patient placed prone on the fluoroscopy table, Betadine prep and sterile  drape.  A 25-gauge 1-1/4 needle was used to anesthetize the skin and subcu  tissue, 1.5 mL of 1% lidocaine each side x6.  Then a 22-gauge 3-1/2 inch  spinal needle was inserted under fluoroscopic guidance to the left SAP-  sacral ala junction.  Bone contact was made.  Omnipaque 180 x0.5 mL showed  no intravascular uptake.  Then a solution containing 2% lidocaine  methylparaben-free x0.5 mL was injected.  The same procedure was done on the  right side.  A 22 3-1/2 inch spinal needle inserted at the right S1 sacral  ala-SAP junction, bone contact made, Omnipaque 180 x0.5 mL demonstrated no  intravascular uptake.  Then 0.5 mL of 2% lidocaine injected.  Then the L5  SAP-transverse process junction was targeted.  A 22-gauge 3-1/2 inch spinal  needle was inserted under fluoroscopic guidance.  Bone contact was made.  Omnipaque 180 demonstrated intravascular uptake. The needle was  then  repositioned x1, continued intravascular uptake; therefore, the needle was  removed and no lidocaine was injected at this site.  Then the right L4  transverse process-SAP junction was targeted.  A 22-gauge 3-1/2 inch spinal  needle inserted and bone contact and the Omnipaque 180 x0.5 mL demonstrated  no intravascular uptake, and 0.5 mL of 2% lidocaine were injected.  Then C-  arm was obliqued toward the left approximately 10 degrees.  The left L4 SAP-  transverse process junction was targeted.  A 22-gauge 3-1/2 inch spinal  needle was inserted, bone contact, Omnipaque 180 x0.5 mL demonstrated no  intravascular uptake, then 0.5 mL of 2% lidocaine injected.  Then the left  L5 SAP-transverse process junction was targeted.  Then 22-gauge 3-1/2 inch  spinal needle was inserted under fluoroscopic guidance until bone contact.  Omnipaque 180 x0.5 mL demonstrated no intravascular uptake.  Then 0.5 mL of  2% lidocaine methylparaben-free were injected.  The patient tolerated the  procedure well.  Post-injection instructions given.  She is to follow up in  three weeks.  If she  had partial  relief from this injection, then will repeat to confirm and then  if this confirmatory injection is positive for reduction of pain greater  than 50%, then would schedule for radiofrequency injection.      AEK/MEDQ  D:  02/03/2004 10:58:24  T:  02/03/2004 11:13:18  Job:  [id number]

## 2010-05-22 NOTE — Assessment & Plan Note (Signed)
Returns today after I saw her on December 24, 2002.  At that time, she had  some reduced lower extremity pain resulting from an S1 transforaminal  epidural injection on the left side.  She states that recently she has felt  like that her left leg pain has gotten a bit worse.  She notes that on her  good days she can walk a quarter of a mile to her mother's house.  On bad  days, she does not do that and she has only done this twice in the last few  weeks.  She did note that her sleep has been very poor.  We started her on  nortriptyline 10 mg last visit, but she has not gone up on the dosage since  that time.   In terms of her neck pain, she mainly has pain on the right side of her  neck, but no pain with any certain motions.   The pain is going from 8-9/10.  Pain locations on the diagram are at the  lower cervical posteriorly, the upper right thoracic area, as well as lumbar  area, left posterior knee area, and right shoulder area.   REVIEW OF SYSTEMS:  Positive for numbness, dizziness, and blurred vision.   PHYSICAL EXAMINATION:  Vital signs with blood pressure 107/75, pulse 86, and  O2 saturation 98% on room air.  Gait is normal.  She is able to toe walk and  heel walk, although she has to toe walk slowly because of decreased balance.   Her neck has full range of motion.  She has negative Spurling's.  Her motor  strength is 5/5 in bilateral upper and lower extremities.  She has normal  sensation in bilateral upper extremities and lower extremities.  She has  decreased L4 and L5 dermatome on the left side to light touch.   Deep tendon reflexes are normal in bilateral upper and lower extremities.  Range of motion normal in bilateral upper extremities.  The back has 50%  forward flexion, extension, lateral rotation, and bending.   IMPRESSION:  1. History of lumbar degenerative disk with left S1 radiculopathy.  She does     have some L4-5 level dermatomal changes.  2. Myofascial pain  syndrome.  She does have some tenderness in the right     upper trapezius.  3. Cervical post laminectomy pain syndrome.  History of C4-5 anterior     diskectomy and fusion in 2002.   PLAN:  1. Continue Avinza dosage of 60 mg a day.  2. Continue Mobic 7.5 mg p.o. daily.  3. Increase nortriptyline to 20 mg q.h.s.  4. Schedule for trigger point injection of right upper trapezius.  5. Hold off on any other corticosteroid injections for another four months.      Erick Colace, M.D.   AEK/MedQ  D:  02/19/2003 09:40:34  T:  02/19/2003 10:21:18  Job #:  [id number]

## 2010-10-01 LAB — CBC
HCT: 38.1
Hemoglobin: 12.5
MCHC: 32.9
MCV: 92.3
RDW: 12.9

## 2010-10-13 ENCOUNTER — Other Ambulatory Visit: Payer: Self-pay | Admitting: Obstetrics & Gynecology

## 2010-10-13 DIAGNOSIS — Z139 Encounter for screening, unspecified: Secondary | ICD-10-CM

## 2010-11-02 ENCOUNTER — Ambulatory Visit (HOSPITAL_COMMUNITY)
Admission: RE | Admit: 2010-11-02 | Discharge: 2010-11-02 | Disposition: A | Discharge status: Home / Self Care | Payer: Medicare Other | Source: Ambulatory Visit | Attending: Obstetrics & Gynecology | Admitting: Obstetrics & Gynecology

## 2010-11-02 DIAGNOSIS — Z1231 Encounter for screening mammogram for malignant neoplasm of breast: Secondary | ICD-10-CM | POA: Insufficient documentation

## 2010-11-02 DIAGNOSIS — Z139 Encounter for screening, unspecified: Secondary | ICD-10-CM

## 2010-12-09 ENCOUNTER — Other Ambulatory Visit (HOSPITAL_COMMUNITY): Payer: Self-pay | Admitting: Internal Medicine

## 2010-12-09 DIAGNOSIS — M545 Low back pain: Secondary | ICD-10-CM

## 2010-12-11 ENCOUNTER — Other Ambulatory Visit (HOSPITAL_COMMUNITY): Payer: Self-pay | Admitting: Internal Medicine

## 2010-12-11 ENCOUNTER — Ambulatory Visit (HOSPITAL_COMMUNITY)
Admission: RE | Admit: 2010-12-11 | Discharge: 2010-12-11 | Disposition: A | Discharge status: Home / Self Care | Payer: Medicare Other | Source: Ambulatory Visit | Attending: Internal Medicine | Admitting: Internal Medicine

## 2010-12-11 DIAGNOSIS — M79609 Pain in unspecified limb: Secondary | ICD-10-CM | POA: Insufficient documentation

## 2010-12-11 DIAGNOSIS — M545 Low back pain, unspecified: Secondary | ICD-10-CM | POA: Insufficient documentation

## 2010-12-11 DIAGNOSIS — M5126 Other intervertebral disc displacement, lumbar region: Secondary | ICD-10-CM | POA: Insufficient documentation

## 2010-12-11 DIAGNOSIS — M5137 Other intervertebral disc degeneration, lumbosacral region: Secondary | ICD-10-CM | POA: Insufficient documentation

## 2010-12-11 DIAGNOSIS — M51379 Other intervertebral disc degeneration, lumbosacral region without mention of lumbar back pain or lower extremity pain: Secondary | ICD-10-CM | POA: Insufficient documentation

## 2011-02-27 ENCOUNTER — Emergency Department (HOSPITAL_COMMUNITY)
Admission: EM | Admit: 2011-02-27 | Discharge: 2011-02-27 | Disposition: A | Discharge status: Home Health | Payer: Medicare Other | Attending: Emergency Medicine | Admitting: Emergency Medicine

## 2011-02-27 ENCOUNTER — Encounter (HOSPITAL_COMMUNITY): Payer: Self-pay | Admitting: Emergency Medicine

## 2011-02-27 DIAGNOSIS — M545 Low back pain, unspecified: Secondary | ICD-10-CM | POA: Insufficient documentation

## 2011-02-27 DIAGNOSIS — M5410 Radiculopathy, site unspecified: Secondary | ICD-10-CM

## 2011-02-27 DIAGNOSIS — IMO0002 Reserved for concepts with insufficient information to code with codable children: Secondary | ICD-10-CM | POA: Insufficient documentation

## 2011-02-27 DIAGNOSIS — M79609 Pain in unspecified limb: Secondary | ICD-10-CM | POA: Insufficient documentation

## 2011-02-27 HISTORY — DX: Reserved for concepts with insufficient information to code with codable children: IMO0002

## 2011-02-27 MED ORDER — CYCLOBENZAPRINE HCL 10 MG PO TABS
5.0000 mg | ORAL_TABLET | Freq: Once | ORAL | Status: AC
  Administered 2011-02-27: 5 mg via ORAL
  Filled 2011-02-27: qty 1

## 2011-02-27 MED ORDER — OXYCODONE-ACETAMINOPHEN 5-325 MG PO TABS
1.0000 | ORAL_TABLET | Freq: Once | ORAL | Status: AC
  Administered 2011-02-27: 1 via ORAL
  Filled 2011-02-27: qty 1

## 2011-02-27 MED ORDER — CYCLOBENZAPRINE HCL 5 MG PO TABS: 5.0000 mg | ORAL_TABLET | Freq: Three times a day (TID) | ORAL | Status: AC | PRN

## 2011-02-27 MED ORDER — HYDROCODONE-ACETAMINOPHEN 5-500 MG PO TABS: 1.0000 | ORAL_TABLET | Freq: Four times a day (QID) | ORAL | Status: AC | PRN

## 2011-02-27 NOTE — ED Provider Notes (Signed)
I saw and evaluated the patient, reviewed the resident's note and I agree with the findings and plan.   .Face to face Exam:  General:  Awake HEENT:  Atraumatic Resp:  Normal effort Abd:  Nondistended Neuro:No focal weakness Lymph: No adenopathy   Nelia Shi, MD 02/27/11 2258

## 2011-02-27 NOTE — ED Notes (Addendum)
Gave report to Jan at [facility] and pt will be transported by ems back to that facility. Documented on wrong pt.

## 2011-02-27 NOTE — Discharge Instructions (Signed)

## 2011-02-27 NOTE — ED Provider Notes (Signed)
History     CSN: [id number]  Arrival date & time 02/27/11  4098   First MD Initiated Contact with Patient 02/27/11 0800      Chief Complaint  Patient presents with  . Back Pain    HPI Pt presents with severe lower back pain.  She says it is in the middle of her lower back and in the right side.  She has some radicular symptoms to the right leg but not below the knee.  She has DJD, but her pain is usually on the left side.  She did not have an injury or fall, or change in activity that could have caused the pain.  She says it is a sharp pain, rates it 10/10.  She denies any incontinence of bowel or bladder, numbness, weakness in her lower extremities.   She says it hurts to sit, lay down, stand, or walk.  She takes not medications on a regular basis.  She says she called her neurosurgeon several days ago but has not heard back.    Past Medical History  Diagnosis Date  . Degenerative disk disease     Past Surgical History  Procedure Date  . Neck surgery   . Dilation and curettage of uterus   . Abdominal surgery   . Appendectomy   . Cholecystectomy   . Partial hysterectomy     No family history on file.  History  Substance Use Topics  . Smoking status: Current Everyday Smoker -- 0.5 packs/day    Types: Cigarettes  . Smokeless tobacco: Not on file  . Alcohol Use:     Review of Systems  Constitutional: Negative for appetite change.  HENT: Negative for neck stiffness.   Eyes: Negative for visual disturbance.  Respiratory: Negative for shortness of breath.   Cardiovascular: Negative for chest pain and leg swelling.  Gastrointestinal: Negative for abdominal pain.  Genitourinary: Negative for dysuria.  Musculoskeletal: Positive for back pain.  Skin: Negative for rash.  Neurological: Negative for weakness and numbness.    Allergies  Ibuprofen and Unasyn  Home Medications   Current Outpatient Rx  Name Route Sig Dispense Refill  . CETIRIZINE HCL 10 MG PO TABS Oral  Take 10 mg by mouth daily.      BP 143/86  Pulse 85  Temp(Src) 97.7 F (36.5 C) (Oral)  Resp 17  Ht 5\' 7"  (1.702 m)  Wt 163 lb (73.936 kg)  BMI 25.53 kg/m2  SpO2 97%  Physical Exam  Nursing note and vitals reviewed. Constitutional: She is oriented to person, place, and time. She appears well-developed and well-nourished. She appears distressed.  HENT:  Head: Normocephalic and atraumatic.  Eyes: EOM are normal. Pupils are equal, round, and reactive to light.  Neck: Normal range of motion. Neck supple.  Musculoskeletal:       Lumbar back: She exhibits tenderness, pain and spasm. She exhibits no swelling and no deformity.       She has pain both over the spine and in the right lumbar musculature.   Neurological: She is alert and oriented to person, place, and time. She has normal strength. She displays normal reflexes. No cranial nerve deficit or sensory deficit.       She has 5/5 strength in upper and lower extremities bilaterally.  Skin: She is not diaphoretic.    ED Course  Procedures (including critical care time)  Labs Reviewed - No data to display No results found.   1. Radicular low back pain  MDM  No acute injury, no red flag symptoms.  Will d/c home with pain medication, instructions for back exercises.  Pt to follow up with her neurosurgeon.         Ardyth Gal, MD 02/27/11 [phone]

## 2011-02-27 NOTE — ED Notes (Signed)
Patient with c/o right lower back pain since Tuesday. Reports disk disease. Reports pain radiates to right leg. Denies numbness/tingling

## 2011-03-25 ENCOUNTER — Ambulatory Visit (HOSPITAL_COMMUNITY)
Admission: RE | Admit: 2011-03-25 | Discharge: 2011-03-25 | Disposition: A | Discharge status: Home / Self Care | Payer: Medicare Other | Source: Ambulatory Visit | Attending: Physician Assistant | Admitting: Physician Assistant

## 2011-03-25 ENCOUNTER — Other Ambulatory Visit (HOSPITAL_COMMUNITY): Payer: Self-pay | Admitting: Physician Assistant

## 2011-03-25 DIAGNOSIS — M25549 Pain in joints of unspecified hand: Secondary | ICD-10-CM

## 2011-03-25 DIAGNOSIS — M79609 Pain in unspecified limb: Secondary | ICD-10-CM | POA: Insufficient documentation

## 2011-08-06 ENCOUNTER — Other Ambulatory Visit (HOSPITAL_COMMUNITY): Payer: Self-pay | Admitting: Family Medicine

## 2011-08-06 DIAGNOSIS — M542 Cervicalgia: Secondary | ICD-10-CM

## 2011-08-10 ENCOUNTER — Ambulatory Visit (HOSPITAL_COMMUNITY)
Admission: RE | Admit: 2011-08-10 | Discharge: 2011-08-10 | Disposition: A | Discharge status: Home / Self Care | Payer: Medicare Other | Source: Ambulatory Visit | Attending: Family Medicine | Admitting: Family Medicine

## 2011-08-10 DIAGNOSIS — Z981 Arthrodesis status: Secondary | ICD-10-CM | POA: Insufficient documentation

## 2011-08-10 DIAGNOSIS — M542 Cervicalgia: Secondary | ICD-10-CM | POA: Insufficient documentation

## 2011-08-10 DIAGNOSIS — M5126 Other intervertebral disc displacement, lumbar region: Secondary | ICD-10-CM | POA: Insufficient documentation

## 2011-10-07 ENCOUNTER — Ambulatory Visit (INDEPENDENT_AMBULATORY_CARE_PROVIDER_SITE_OTHER): Payer: Medicare Other | Admitting: Otolaryngology

## 2011-10-07 DIAGNOSIS — J310 Chronic rhinitis: Secondary | ICD-10-CM

## 2011-10-07 DIAGNOSIS — J343 Hypertrophy of nasal turbinates: Secondary | ICD-10-CM

## 2011-10-26 ENCOUNTER — Other Ambulatory Visit (HOSPITAL_COMMUNITY): Payer: Self-pay | Admitting: Family Medicine

## 2011-10-26 DIAGNOSIS — Z139 Encounter for screening, unspecified: Secondary | ICD-10-CM

## 2011-11-04 ENCOUNTER — Ambulatory Visit (INDEPENDENT_AMBULATORY_CARE_PROVIDER_SITE_OTHER): Payer: Medicare Other | Admitting: Otolaryngology

## 2011-11-04 DIAGNOSIS — J310 Chronic rhinitis: Secondary | ICD-10-CM

## 2011-11-04 DIAGNOSIS — J343 Hypertrophy of nasal turbinates: Secondary | ICD-10-CM

## 2011-11-08 ENCOUNTER — Ambulatory Visit (HOSPITAL_COMMUNITY)
Admission: RE | Admit: 2011-11-08 | Discharge: 2011-11-08 | Disposition: A | Discharge status: Home / Self Care | Payer: Medicare Other | Source: Ambulatory Visit | Attending: Family Medicine | Admitting: Family Medicine

## 2011-11-08 DIAGNOSIS — Z1231 Encounter for screening mammogram for malignant neoplasm of breast: Secondary | ICD-10-CM | POA: Insufficient documentation

## 2011-11-08 DIAGNOSIS — Z139 Encounter for screening, unspecified: Secondary | ICD-10-CM

## 2012-03-15 ENCOUNTER — Ambulatory Visit (HOSPITAL_COMMUNITY)
Admission: RE | Admit: 2012-03-15 | Discharge: 2012-03-15 | Disposition: A | Discharge status: Home / Self Care | Payer: Medicare Other | Source: Ambulatory Visit | Attending: Family Medicine | Admitting: Family Medicine

## 2012-03-15 ENCOUNTER — Other Ambulatory Visit (HOSPITAL_COMMUNITY): Payer: Self-pay | Admitting: Family Medicine

## 2012-03-15 DIAGNOSIS — M7150 Other bursitis, not elsewhere classified, unspecified site: Secondary | ICD-10-CM | POA: Insufficient documentation

## 2012-03-15 DIAGNOSIS — IMO0002 Reserved for concepts with insufficient information to code with codable children: Secondary | ICD-10-CM

## 2012-03-15 DIAGNOSIS — M1610 Unilateral primary osteoarthritis, unspecified hip: Secondary | ICD-10-CM | POA: Insufficient documentation

## 2012-03-15 DIAGNOSIS — M25559 Pain in unspecified hip: Secondary | ICD-10-CM | POA: Insufficient documentation

## 2012-03-15 DIAGNOSIS — M169 Osteoarthritis of hip, unspecified: Secondary | ICD-10-CM | POA: Insufficient documentation

## 2012-10-02 ENCOUNTER — Ambulatory Visit (HOSPITAL_COMMUNITY)
Admission: RE | Admit: 2012-10-02 | Discharge: 2012-10-02 | Disposition: A | Discharge status: Home / Self Care | Payer: Medicare Other | Source: Ambulatory Visit | Attending: Neurosurgery | Admitting: Neurosurgery

## 2012-10-02 DIAGNOSIS — R29898 Other symptoms and signs involving the musculoskeletal system: Secondary | ICD-10-CM | POA: Insufficient documentation

## 2012-10-02 DIAGNOSIS — IMO0001 Reserved for inherently not codable concepts without codable children: Secondary | ICD-10-CM | POA: Insufficient documentation

## 2012-10-02 DIAGNOSIS — R269 Unspecified abnormalities of gait and mobility: Secondary | ICD-10-CM | POA: Insufficient documentation

## 2012-10-02 DIAGNOSIS — R2689 Other abnormalities of gait and mobility: Secondary | ICD-10-CM | POA: Insufficient documentation

## 2012-10-02 NOTE — Evaluation (Signed)
Physical Therapy Evaluation  Patient Details  Name: [name] MRN: [id number] Date of Birth: [birth year]  Today's Date: 10/02/2012 Time: 3086-5784 PT Time Calculation (min): 51 min Charge Eval [id number]; there ex 1144-155             Visit#: 1 of 8  Re-eval: 11/01/12 Assessment Diagnosis: balance difficulty Next MD Visit: 11/11/2012 Prior Therapy: none  Authorization: [employer]BS medicare     Past Medical History:  Past Medical History  Diagnosis Date  . Degenerative disk disease    Past Surgical History:  Past Surgical History  Procedure Laterality Date  . Neck surgery    . Dilation and curettage of uterus    . Abdominal surgery    . Appendectomy    . Cholecystectomy    . Partial hysterectomy      Subjective Symptoms/Limitations Symptoms: Pt states that she has been having difficulty balancing for years but she has noticed that this has been getting worse therefore she went to her MD who referred her to therapy. Pertinent History: cervical surgery x 2; pt states her neck is starting to bother her again. How long can you walk comfortably?: Pt feels unbalanced when walking more than 30 minutes; Pt avoids steps due to decreased balance.   Patient Stated Goals: Pt wants to feel confident when walking and going up steps. Pain Assessment Currently in Pain?: Yes Pain Score: 5  Pain Location: Neck Pain Orientation: Right;Left Pain Onset: More than a month ago Pain Frequency: Constant Multiple Pain Sites: Yes     Prior Functioning  Prior Function Vocation: On disability Leisure: Hobbies-no  Cognition/Observation Cognition Overall Cognitive Status: Within Functional Limits for tasks assessed  Sensation/Coordination/Flexibility/Functional Tests Functional Tests Functional Tests: bidex balsnce assessment  dynamic overall 12 Functional Tests: static in quadrant I 43%  Assessment RLE Strength Right Hip Flexion: 5/5 Right Hip Extension: 4/5 Right Hip ABduction:  5/5 Right Hip ADduction: 5/5 Right Knee Flexion: 4/5 Right Knee Extension: 5/5 Right Ankle Dorsiflexion: 4/5 LLE Strength Left Hip Flexion: 3+/5 Left Hip Extension: 3+/5 Left Hip ABduction: 4/5 Left Hip ADduction: 3/5 Left Knee Flexion: 4/5 Left Knee Extension:  (4-/5) Left Ankle Dorsiflexion: 3+/5  Exercise/Treatments Mobility/Balance  Berg Balance Test Sit to Stand: Able to stand without using hands and stabilize independently Standing Unsupported: Able to stand safely 2 minutes Sitting with Back Unsupported but Feet Supported on Floor or Stool: Able to sit safely and securely 2 minutes Stand to Sit: Sits safely with minimal use of hands Transfers: Able to transfer safely, minor use of hands Standing Unsupported with Eyes Closed: Able to stand 10 seconds with supervision Standing Ubsupported with Feet Together: Able to place feet together independently and stand 1 minute safely From Standing, Reach Forward with Outstretched Arm: Can reach confidently >25 cm (10") From Standing Position, Pick up Object from Floor: Able to pick up shoe, needs supervision From Standing Position, Turn to Look Behind Over each Shoulder: Looks behind one side only/other side shows less weight shift Turn 360 Degrees: Able to turn 360 degrees safely in 4 seconds or less Standing Unsupported, Alternately Place Feet on Step/Stool: Able to stand independently and safely and complete 8 steps in 20 seconds Standing Unsupported, One Foot in Front: Able to place foot tandem independently and hold 30 seconds Standing on One Leg: Able to lift leg independently and hold > 10 seconds Total Score: 53   Seated Long Arc Quad: Left;5 reps Supine Straight Leg Raises: Left;5 reps Other Supine Knee Exercises: L dorsiflex  with blue t-band x 10 Sidelying Hip ABduction: Left;5 reps Hip ADduction: Left;5 reps Prone  Hamstring Curl: 5 reps Hip Extension: Both;5 reps    Physical Therapy Assessment and Plan PT  Assessment and Plan Clinical Impression Statement: Pt to department with complaint of progressive difficulty with balance.  Pt exam demonstrates decreased Lt LE weakness; decreased dynamic balance. PT Plan: begin high balance activity; pt has already been tested on biodex so may do this as an exercise; balance beam tandem, retro 1-15; pt on wobble board reaching and squatting;.........    Goals Home Exercise Program Pt/caregiver will Perform Home Exercise Program: For increased strengthening PT Goal: Perform Home Exercise Program - Progress: Goal set today PT Short Term Goals Time to Complete Short Term Goals: 2 weeks PT Short Term Goal 1: Pt Berg to improve to 56/56 PT Short Term Goal 2: Pt to state she feels balance has improved 50% to decrease risk of fall PT Long Term Goals Time to Complete Long Term Goals: 4 weeks PT Long Term Goal 1: Pt to state she feels balance has improved 80% to improve safety PT Long Term Goal 2: Pt strength to be improved one grade to allow the above to occur Long Term Goal 3: Pt dynamic Biodex balance  to be improved overall to at least 25% Long Term Goal 4: Pt I in HEP  Problem List Patient Active Problem List   Diagnosis Date Noted  . Left leg weakness 10/02/2012  . Unstable balance 10/02/2012  . IMPINGEMENT SYNDROME 07/28/2009  . SHOULDER STRAIN, LEFT 07/28/2009    General Behavior During Therapy: [facility] for tasks assessed/performed PT Plan of Care PT Home Exercise Plan: given Consulted and Agree with Plan of Care: Patient  GP Functional Assessment Tool Used: clinical judgement; berg and biodex Functional Limitation: Changing and maintaining body position Changing and Maintaining Body Position Current Status (Z6109): At least 20 percent but less than 40 percent impaired, limited or restricted Changing and Maintaining Body Position Goal Status (U0454): At least 1 percent but less than 20 percent impaired, limited or  restricted  Chade Pitner,CINDY 10/02/2012, 12:17 PM  Physician Documentation Your signature is required to indicate approval of the treatment plan as stated above.  Please sign and either send electronically or make a copy of this report for your files and return this physician signed original.   Please mark one 1.__approve of plan  2. ___approve of plan with the following conditions.   ______________________________                                                          _____________________ Physician Signature                                                                                                             Date

## 2012-10-04 ENCOUNTER — Ambulatory Visit (HOSPITAL_COMMUNITY): Payer: Medicare Other | Admitting: *Deleted

## 2012-10-10 ENCOUNTER — Ambulatory Visit (HOSPITAL_COMMUNITY)
Admission: RE | Admit: 2012-10-10 | Discharge: 2012-10-10 | Disposition: A | Discharge status: Home / Self Care | Payer: Medicare Other | Source: Ambulatory Visit | Attending: Family Medicine | Admitting: Family Medicine

## 2012-10-10 DIAGNOSIS — R29898 Other symptoms and signs involving the musculoskeletal system: Secondary | ICD-10-CM | POA: Insufficient documentation

## 2012-10-10 DIAGNOSIS — R269 Unspecified abnormalities of gait and mobility: Secondary | ICD-10-CM | POA: Insufficient documentation

## 2012-10-10 DIAGNOSIS — IMO0001 Reserved for inherently not codable concepts without codable children: Secondary | ICD-10-CM | POA: Insufficient documentation

## 2012-10-10 NOTE — Progress Notes (Signed)
Physical Therapy Treatment Patient Details  Name: [name] MRN: [id number] Date of Birth: [birth year]  Today's Date: 10/10/2012 Time: 1102-1148 PT Time Calculation (min): 46 min Visit#: 2 of 8  Re-eval: 11/01/12 Authorization: [employer]BS medicare  Charges: NMR 1102-1136 (34'), therex 2956-2130 (11)   Subjective: Symptoms/Limitations Symptoms: Pt states the leg exercises are really helping.  STates she already feels more stable. Pain Assessment Currently in Pain?: No/denies   Exercise/Treatments Aerobic Stationary Bike: NuStep 8' Level 3 hills #3 Machines for Strengthening Cybex Knee Extension: 2PL BLE 10 reps, 1PL singles 10 reps each Cybex Knee Flexion: 4PL BLE's 2X10 reps Standing Wall Squat: 10 reps;5 seconds Other Standing Knee Exercises: tandem gait 2RT, heel walk/ toe walk 2RT, retro ambulation 2RT, side stepping with blue tband 1RT Other Standing Knee Exercises: balance beam 2RT  Physical Therapy Assessment and Plan PT Assessment and Plan Clinical Impression Statement: Progressed program with high balance activities.  Pt with most difficulty performing heel walking and tandem walking.  Minimal LOB with all activities. Noted weakness with eccentric phase of quad strengthening.  Pt with voiced improvement already due to increasing LE strength.  Reports compliance with HEP. PT Plan: Progress high balance activity.  Add Dentist (has already been tested), SLS with vectors, step ups with eccentric control.  Add BOSU and/or wobble board to progress difficulty as ready.     Problem List Patient Active Problem List   Diagnosis Date Noted  . Left leg weakness 10/02/2012  . Unstable balance 10/02/2012  . IMPINGEMENT SYNDROME 07/28/2009  . SHOULDER STRAIN, LEFT 07/28/2009    General Behavior During Therapy: [facility] for tasks assessed/performed PT Plan of Care Consulted and Agree with Plan of Care: Patient   Lurena Nida, PTA/CLT 10/10/2012, 12:01 PM

## 2012-10-12 ENCOUNTER — Ambulatory Visit (HOSPITAL_COMMUNITY): Payer: Medicare Other | Admitting: Physical Therapy

## 2012-10-17 ENCOUNTER — Ambulatory Visit (HOSPITAL_COMMUNITY)
Admission: RE | Admit: 2012-10-17 | Discharge: 2012-10-17 | Disposition: A | Discharge status: Home / Self Care | Payer: Medicare Other | Source: Ambulatory Visit | Attending: Family Medicine | Admitting: Family Medicine

## 2012-10-17 DIAGNOSIS — R29898 Other symptoms and signs involving the musculoskeletal system: Secondary | ICD-10-CM

## 2012-10-17 DIAGNOSIS — R2689 Other abnormalities of gait and mobility: Secondary | ICD-10-CM

## 2012-10-17 NOTE — Progress Notes (Signed)
Physical Therapy Treatment Patient Details  Name: [name] MRN: [id number] Date of Birth: [birth year]  Today's Date: 10/17/2012 Time: 1102-1158 PT Time Calculation (min): 56 min Charge: TE 1102-1125, NMR [id number]  Visit#: 3 of 8  Re-eval: 11/01/12 Assessment Diagnosis: balance difficulty Next MD Visit: 11/11/2012 Prior Therapy: none  Authorization: BCBS medicare  Authorization Time Period:    Authorization Visit#:   of     Subjective: Symptoms/Limitations Symptoms: Pt stated her Lt LE is bothering her today due to weather.   Pain Assessment Currently in Pain?: Yes Pain Score: 7  Pain Location: Leg Pain Orientation: Left  Objective:  Exercise/Treatments Aerobic Stationary Bike: NuStep 10' Level 3 hills #3 LE only Machines for Strengthening Cybex Knee Extension: 2PL BLE 15 reps Cybex Knee Flexion: 4PL BLE's 15 reps Standing Functional Squat: 10 reps;Limitations Functional Squat Limitations: standing on BOSU  Balance Exercises Standing SLS: Eyes open;1 rep;Time SLS Time: 60"= Bil LE SLS with Vectors: Solid surface;Foam;Limitations SLS with Vectors Limitations: Bil LE 2x 5" Balance Master: Limits for Stability: moderate difficulty, stability level 6 no HHA 1:43 Balance Master: Dynamic: L6 stability no HHA x 2 minutes Balance Beam: 2RT tandem and retro gait, 1 RT sidestepping Numbers 1-15: Balance Beam;1 rep Cone Rotation: Foam;Right turn;Left turn;A/P;Limitations Cone Rotation Limitations: standing on BOSU reaching for cone on mat and rotating R/L   Physical Therapy Assessment and Plan PT Assessment and Plan Clinical Impression Statement: Advanced high level balance activities with most difficulty with activities on dynamic surfaces, minimal LOB with all activities requiring min assistance.  Noted visible musculature fatigue with eccentric phase quad strengthening activities. PT Plan: Continue high level balance activities to improve BERG and pt.'s  confidence with balance.  Begin stair training next session and advance activities on BOSU.      Goals Home Exercise Program Pt/caregiver will Perform Home Exercise Program: For increased strengthening PT Short Term Goals Time to Complete Short Term Goals: 2 weeks PT Short Term Goal 1: Pt Berg to improve to 56/56 PT Short Term Goal 1 - Progress: Progressing toward goal PT Short Term Goal 2: Pt to state she feels balance has improved 50% to decrease risk of fall PT Long Term Goals Time to Complete Long Term Goals: 4 weeks PT Long Term Goal 1: Pt to state she feels balance has improved 80% to improve safety PT Long Term Goal 2: Pt strength to be improved one grade to allow the above to occur PT Long Term Goal 2 - Progress: Progressing toward goal Long Term Goal 3: Pt dynamic Biodex balance  to be improved overall to at least 25% Long Term Goal 4: Pt I in HEP Long Term Goal 4 Progress: Progressing toward goal  Problem List Patient Active Problem List   Diagnosis Date Noted  . Left leg weakness 10/02/2012  . Unstable balance 10/02/2012  . IMPINGEMENT SYNDROME 07/28/2009  . SHOULDER STRAIN, LEFT 07/28/2009    PT - End of Session Equipment Utilized During Treatment: Gait belt Activity Tolerance: Patient tolerated treatment well General Behavior During Therapy: [facility] for tasks assessed/performed  GP    Juel Burrow 10/17/2012, 12:12 PM

## 2012-10-19 ENCOUNTER — Ambulatory Visit (HOSPITAL_COMMUNITY)
Admission: RE | Admit: 2012-10-19 | Discharge: 2012-10-19 | Disposition: A | Discharge status: Home / Self Care | Payer: Medicare Other | Source: Ambulatory Visit | Attending: Family Medicine | Admitting: Family Medicine

## 2012-10-19 NOTE — Progress Notes (Signed)
Physical Therapy Treatment Patient Details  Name: [name] MRN: [id number] Date of Birth: [birth year]  Today's Date: 10/19/2012 Time: 4098-1191 PT Time Calculation (min): 40 min  Visit#: 4 of 8  Re-eval: 11/01/12 Authorization: [employer]BS medicare  Authorization Visit#: 4 of 8  Charges:  Therex [id number] (14'), NMR 1123-1148 (25')   Subjective: Symptoms/Limitations Symptoms: Pt stated her Lt LE is bothering her today due to weather.   Pain Assessment Currently in Pain?: Yes Pain Score: 7  Pain Location: Leg Pain Orientation: Left Pain Radiating Towards: hip down to calf   Exercise/Treatments Aerobic Stationary Bike: NuStep 10' Level 3 hills #3 LE only Machines for Strengthening Cybex Knee Extension: 2PL BLE 2 sets 15 reps Cybex Knee Flexion: 4PL BLE's 2 sets 15 reps Standing Add step ups working on eccentric quad strengthening Add Squats with BOSU  Balance Exercises Standing SLS: Eyes open;Foam SLS Time: max of 3 R: 33", L: 12" SLS with Vectors: Foam SLS with Vectors Limitations: B 5X5" each Balance Master: Limits for Stability: moderate difficulty, stability level 6 no HHA  1:36" Balance Master: Dynamic: L6 stability no HHA x 2 minutes     Physical Therapy Assessment and Plan PT Assessment and Plan Clinical Impression Statement: Progressed static balance using foam; pt required intermittent HHA with vector stance on Lt LE due to weakness/discomfort.  Able to increase to 2 sets of cybex exercises today. PT Plan: Continue high level balance activities to improve BERG and pt.'s confidence with balance.  Begin stair training next session and advance activities on BOSU.       Problem List Patient Active Problem List   Diagnosis Date Noted  . Left leg weakness 10/02/2012  . Unstable balance 10/02/2012  . IMPINGEMENT SYNDROME 07/28/2009  . SHOULDER STRAIN, LEFT 07/28/2009    PT - End of Session Equipment Utilized During Treatment: Gait belt Activity Tolerance:  Patient tolerated treatment well General Behavior During Therapy: [facility] for tasks assessed/performed   Lurena Nida, PTA/CLT 10/19/2012, 11:46 AM

## 2012-10-24 ENCOUNTER — Ambulatory Visit (HOSPITAL_COMMUNITY): Payer: Medicare Other | Admitting: Physical Therapy

## 2012-10-27 ENCOUNTER — Inpatient Hospital Stay (HOSPITAL_COMMUNITY): Admission: RE | Admit: 2012-10-27 | Payer: Medicare Other | Source: Ambulatory Visit | Admitting: Physical Therapy

## 2012-12-11 ENCOUNTER — Other Ambulatory Visit (INDEPENDENT_AMBULATORY_CARE_PROVIDER_SITE_OTHER): Payer: Self-pay | Admitting: Otolaryngology

## 2012-12-11 DIAGNOSIS — J329 Chronic sinusitis, unspecified: Secondary | ICD-10-CM

## 2012-12-14 ENCOUNTER — Ambulatory Visit
Admission: RE | Admit: 2012-12-14 | Discharge: 2012-12-14 | Disposition: A | Discharge status: Home / Self Care | Payer: Medicare Other | Source: Ambulatory Visit | Attending: Otolaryngology | Admitting: Otolaryngology

## 2012-12-14 DIAGNOSIS — J329 Chronic sinusitis, unspecified: Secondary | ICD-10-CM

## 2013-06-14 ENCOUNTER — Ambulatory Visit (HOSPITAL_COMMUNITY)
Admission: RE | Admit: 2013-06-14 | Discharge: 2013-06-14 | Disposition: A | Discharge status: Home / Self Care | Payer: Medicare HMO | Source: Ambulatory Visit | Attending: Family Medicine | Admitting: Family Medicine

## 2013-06-14 ENCOUNTER — Other Ambulatory Visit (HOSPITAL_COMMUNITY): Payer: Self-pay | Admitting: Family Medicine

## 2013-06-14 DIAGNOSIS — M7989 Other specified soft tissue disorders: Secondary | ICD-10-CM | POA: Insufficient documentation

## 2013-06-14 DIAGNOSIS — M79609 Pain in unspecified limb: Secondary | ICD-10-CM

## 2013-10-11 ENCOUNTER — Other Ambulatory Visit (HOSPITAL_COMMUNITY): Payer: Self-pay | Admitting: Family Medicine

## 2013-10-11 DIAGNOSIS — Z139 Encounter for screening, unspecified: Secondary | ICD-10-CM

## 2013-10-15 ENCOUNTER — Ambulatory Visit (HOSPITAL_COMMUNITY)
Admission: RE | Admit: 2013-10-15 | Discharge: 2013-10-15 | Disposition: A | Discharge status: Home / Self Care | Payer: Medicare HMO | Source: Ambulatory Visit | Attending: Family Medicine | Admitting: Family Medicine

## 2013-10-15 DIAGNOSIS — Z1231 Encounter for screening mammogram for malignant neoplasm of breast: Secondary | ICD-10-CM | POA: Diagnosis present

## 2013-10-15 DIAGNOSIS — Z139 Encounter for screening, unspecified: Secondary | ICD-10-CM

## 2013-12-31 ENCOUNTER — Other Ambulatory Visit (HOSPITAL_COMMUNITY): Payer: Self-pay | Admitting: Family Medicine

## 2013-12-31 DIAGNOSIS — M5410 Radiculopathy, site unspecified: Secondary | ICD-10-CM

## 2013-12-31 DIAGNOSIS — Z9889 Other specified postprocedural states: Secondary | ICD-10-CM

## 2013-12-31 DIAGNOSIS — M5412 Radiculopathy, cervical region: Secondary | ICD-10-CM

## 2014-01-08 ENCOUNTER — Ambulatory Visit (HOSPITAL_COMMUNITY)
Admission: RE | Admit: 2014-01-08 | Discharge: 2014-01-08 | Disposition: A | Discharge status: Home / Self Care | Payer: Medicare HMO | Source: Ambulatory Visit | Attending: Family Medicine | Admitting: Family Medicine

## 2014-01-08 DIAGNOSIS — R531 Weakness: Secondary | ICD-10-CM | POA: Insufficient documentation

## 2014-01-08 DIAGNOSIS — Z9889 Other specified postprocedural states: Secondary | ICD-10-CM

## 2014-01-08 DIAGNOSIS — Z981 Arthrodesis status: Secondary | ICD-10-CM | POA: Diagnosis not present

## 2014-01-08 DIAGNOSIS — M5410 Radiculopathy, site unspecified: Secondary | ICD-10-CM

## 2014-01-08 DIAGNOSIS — M2578 Osteophyte, vertebrae: Secondary | ICD-10-CM | POA: Insufficient documentation

## 2014-01-08 DIAGNOSIS — M542 Cervicalgia: Secondary | ICD-10-CM | POA: Insufficient documentation

## 2014-01-08 DIAGNOSIS — E041 Nontoxic single thyroid nodule: Secondary | ICD-10-CM | POA: Diagnosis not present

## 2014-01-08 DIAGNOSIS — M47892 Other spondylosis, cervical region: Secondary | ICD-10-CM | POA: Insufficient documentation

## 2014-01-08 DIAGNOSIS — M5412 Radiculopathy, cervical region: Secondary | ICD-10-CM

## 2014-01-17 ENCOUNTER — Telehealth: Payer: Self-pay

## 2014-01-17 ENCOUNTER — Other Ambulatory Visit: Payer: Self-pay

## 2014-01-17 DIAGNOSIS — Z1211 Encounter for screening for malignant neoplasm of colon: Secondary | ICD-10-CM

## 2014-01-17 NOTE — Telephone Encounter (Signed)
PREPOPIK-DRINK WATER TO KEEP URINE LIGHT YELLOW.  PT SHOULD DROP OFF RX 3 DAYS PRIOR TO PROCEDURE.  

## 2014-01-17 NOTE — Telephone Encounter (Signed)
Gastroenterology Pre-Procedure Review  Request Date:01/17/2014 Requesting Physician: Dr. Hilma Favors  PATIENT REVIEW QUESTIONS: The patient responded to the following health history questions as indicated:    1. Diabetes Melitis: no 2. Joint replacements in the past 12 months: no 3. Major health problems in the past 3 months: no 4. Has an artificial valve or MVP: no 5. Has a defibrillator: no 6. Has been advised in past to take antibiotics in advance of a procedure like teeth cleaning: no    MEDICATIONS & ALLERGIES:    Patient reports the following regarding taking any blood thinners:   Plavix? no Aspirin? no Coumadin? no  Patient confirms/reports the following medications:  Current Outpatient Prescriptions  Medication Sig Dispense Refill  . NON FORMULARY Tramadol/   Pt has just been given Tramadol for hip pain but did not have it with her and not sure of the strength    . cetirizine (ZYRTEC) 10 MG tablet Take 10 mg by mouth at bedtime.      No current facility-administered medications for this visit.    Patient confirms/reports the following allergies:  Allergies  Allergen Reactions  . Augmentin [Amoxicillin-Pot Clavulanate] Nausea And Vomiting  . Shellfish Allergy Hives and Swelling  . Ibuprofen Hives and Swelling  . Unasyn [Ampicillin-Sulbactam Sodium] Hives and Swelling    No orders of the defined types were placed in this encounter.    AUTHORIZATION INFORMATION Primary Insurance:   ID #:  Group #:  Pre-Cert / Auth required: Pre-Cert / Auth #:   Secondary Insurance:   ID #:   Group #:  Pre-Cert / Auth required:  Pre-Cert / Auth #:   SCHEDULE INFORMATION: Procedure has been scheduled as follows:  Date: 02/01/2014             Time: 8:30 AM Location: [facility] Short Stay  This Gastroenterology Pre-Precedure Review Form is being routed to the following provider(s): Barney Drain, MD

## 2014-01-22 MED ORDER — SOD PICOSULFATE-MAG OX-CIT ACD 10-3.5-12 MG-GM-GM PO PACK: 1.0000 | PACK | ORAL | Status: DC

## 2014-01-22 NOTE — Telephone Encounter (Signed)
Rx sent to the pharmacy and instructions mailed to pt.  

## 2014-01-23 ENCOUNTER — Telehealth: Payer: Self-pay

## 2014-01-23 MED ORDER — PEG 3350-KCL-NA BICARB-NACL 420 G PO SOLR: 4000.0000 mL | ORAL | Status: DC

## 2014-01-23 NOTE — Telephone Encounter (Signed)
Pt said she cannot afford Prepopik and I am sending in Corona and mailing her new instructions.

## 2014-01-23 NOTE — Telephone Encounter (Signed)
I called [employer] at [phone] and got the automation. PA not required for the screening colonoscopy.  Reference # [id number].  The message did say that something might not be covered in the pt's plan and I called pt and told her to call her insurance co.

## 2014-01-24 NOTE — Telephone Encounter (Signed)
Pt called and said that she called the insurance co and was told that the colonoscopy will be covered as outpatient but she wlll need to pay $290.00 co pay. She is calling [facility] to see if that will be required prior to the procedure or if she can be billed.

## 2014-02-01 ENCOUNTER — Encounter (HOSPITAL_COMMUNITY)
Admission: RE | Disposition: A | Discharge status: Home / Self Care | Payer: Self-pay | Source: Ambulatory Visit | Attending: Gastroenterology

## 2014-02-01 ENCOUNTER — Encounter (HOSPITAL_COMMUNITY): Payer: Self-pay | Admitting: *Deleted

## 2014-02-01 ENCOUNTER — Ambulatory Visit (HOSPITAL_COMMUNITY)
Admission: RE | Admit: 2014-02-01 | Discharge: 2014-02-01 | Disposition: A | Discharge status: Home / Self Care | Payer: Medicare HMO | Source: Ambulatory Visit | Attending: Gastroenterology | Admitting: Gastroenterology

## 2014-02-01 DIAGNOSIS — Z91013 Allergy to seafood: Secondary | ICD-10-CM | POA: Insufficient documentation

## 2014-02-01 DIAGNOSIS — K219 Gastro-esophageal reflux disease without esophagitis: Secondary | ICD-10-CM | POA: Diagnosis not present

## 2014-02-01 DIAGNOSIS — D123 Benign neoplasm of transverse colon: Secondary | ICD-10-CM

## 2014-02-01 DIAGNOSIS — I10 Essential (primary) hypertension: Secondary | ICD-10-CM | POA: Insufficient documentation

## 2014-02-01 DIAGNOSIS — Z881 Allergy status to other antibiotic agents status: Secondary | ICD-10-CM | POA: Diagnosis not present

## 2014-02-01 DIAGNOSIS — Z8371 Family history of colonic polyps: Secondary | ICD-10-CM | POA: Diagnosis not present

## 2014-02-01 DIAGNOSIS — K621 Rectal polyp: Secondary | ICD-10-CM | POA: Diagnosis not present

## 2014-02-01 DIAGNOSIS — Z1211 Encounter for screening for malignant neoplasm of colon: Secondary | ICD-10-CM | POA: Insufficient documentation

## 2014-02-01 DIAGNOSIS — K648 Other hemorrhoids: Secondary | ICD-10-CM | POA: Insufficient documentation

## 2014-02-01 DIAGNOSIS — Z888 Allergy status to other drugs, medicaments and biological substances status: Secondary | ICD-10-CM | POA: Insufficient documentation

## 2014-02-01 DIAGNOSIS — K6389 Other specified diseases of intestine: Secondary | ICD-10-CM | POA: Insufficient documentation

## 2014-02-01 DIAGNOSIS — Z884 Allergy status to anesthetic agent status: Secondary | ICD-10-CM | POA: Insufficient documentation

## 2014-02-01 HISTORY — DX: Gastro-esophageal reflux disease without esophagitis: K21.9

## 2014-02-01 HISTORY — DX: Essential (primary) hypertension: I10

## 2014-02-01 HISTORY — PX: COLONOSCOPY: SHX5424

## 2014-02-01 SURGERY — COLONOSCOPY
Anesthesia: Moderate Sedation

## 2014-02-01 MED ORDER — MIDAZOLAM HCL 5 MG/5ML IJ SOLN
INTRAMUSCULAR | Status: DC | PRN
  Administered 2014-02-01: 2 mg via INTRAVENOUS
  Administered 2014-02-01: 1 mg via INTRAVENOUS
  Administered 2014-02-01: 2 mg via INTRAVENOUS

## 2014-02-01 MED ORDER — SODIUM CHLORIDE 0.9 % IV SOLN
INTRAVENOUS | Status: DC
Start: 2014-02-01 — End: 2014-02-01
  Administered 2014-02-01: 08:00:00 via INTRAVENOUS

## 2014-02-01 MED ORDER — MEPERIDINE HCL 100 MG/ML IJ SOLN
INTRAMUSCULAR | Status: AC
  Filled 2014-02-01: qty 2

## 2014-02-01 MED ORDER — STERILE WATER FOR IRRIGATION IR SOLN
Status: DC | PRN
  Administered 2014-02-01: 08:00:00

## 2014-02-01 MED ORDER — MIDAZOLAM HCL 5 MG/5ML IJ SOLN
INTRAMUSCULAR | Status: AC
  Filled 2014-02-01: qty 10

## 2014-02-01 MED ORDER — MEPERIDINE HCL 100 MG/ML IJ SOLN
INTRAMUSCULAR | Status: DC | PRN
  Administered 2014-02-01 (×3): 25 mg

## 2014-02-01 NOTE — Discharge Instructions (Signed)
You had 7 small polyps removed. You have internal hemorrhoids and diverticulosis IN YOUR LEFT COLON.   FOLLOW A HIGH FIBER DIET. AVOID ITEMS THAT CAUSE BLOATING. SEE INFO BELOW.  YOU CAN LOOK your results up IN MY CHART AFTER FEB 3 OR MY OFFICE WILL CALL YOU WITH YOUR RESULTS WITHIN 10-14 DAYS.  Next colonoscopy in 5-10 years.   Colonoscopy Care After Read the instructions outlined below and refer to this sheet in the next week. These discharge instructions provide you with general information on caring for yourself after you leave the hospital. While your treatment has been planned according to the most current medical practices available, unavoidable complications occasionally occur. If you have any problems or questions after discharge, call DR. Colten Desroches, [phone].  ACTIVITY  You may resume your regular activity, but move at a slower pace for the next 24 hours.   Take frequent rest periods for the next 24 hours.   Walking will help get rid of the air and reduce the bloated feeling in your belly (abdomen).   No driving for 24 hours (because of the medicine (anesthesia) used during the test).   You may shower.   Do not sign any important legal documents or operate any machinery for 24 hours (because of the anesthesia used during the test).    NUTRITION  Drink plenty of fluids.   You may resume your normal diet as instructed by your doctor.   Begin with a light meal and progress to your normal diet. Heavy or fried foods are harder to digest and may make you feel sick to your stomach (nauseated).   Avoid alcoholic beverages for 24 hours or as instructed.    MEDICATIONS  You may resume your normal medications.   WHAT YOU CAN EXPECT TODAY  Some feelings of bloating in the abdomen.   Passage of more gas than usual.   Spotting of blood in your stool or on the toilet paper  .  IF YOU HAD POLYPS REMOVED DURING THE COLONOSCOPY:  Eat a soft diet IF YOU HAVE NAUSEA,  BLOATING, ABDOMINAL PAIN, OR VOMITING.    FINDING OUT THE RESULTS OF YOUR TEST Not all test results are available during your visit. DR. Oneida Alar WILL CALL YOU WITHIN 14 DAYS OF YOUR PROCEDUE WITH YOUR RESULTS. Do not assume everything is normal if you have not heard from DR. Hope Holst, CALL HER OFFICE AT [phone].  SEEK IMMEDIATE MEDICAL ATTENTION AND CALL THE OFFICE: [phone] IF:  You have more than a spotting of blood in your stool.   Your belly is swollen (abdominal distention).   You are nauseated or vomiting.   You have a temperature over 101F.   You have abdominal pain or discomfort that is severe or gets worse throughout the day.    Polyps, Colon  A polyp is extra tissue that grows inside your body. Colon polyps grow in the large intestine. The large intestine, also called the colon, is part of your digestive system. It is a long, hollow tube at the end of your digestive tract where your body makes and stores stool. Most polyps are not dangerous. They are benign. This means they are not cancerous. But over time, some types of polyps can turn into cancer. Polyps that are smaller than a pea are usually not harmful. But larger polyps could someday become or may already be cancerous. To be safe, doctors remove all polyps and test them.   WHO GETS POLYPS? Anyone can get polyps, but certain  people are more likely than others. You may have a greater chance of getting polyps if:  You are over 50.   You have had polyps before.   Someone in your family has had polyps.   Someone in your family has had cancer of the large intestine.   Find out if someone in your family has had polyps. You may also be more likely to get polyps if you:   Eat a lot of fatty foods   Smoke   Drink alcohol   Do not exercise  Eat too much   TREATMENT  The caregiver will remove the polyp during sigmoidoscopy or colonoscopy.  PREVENTION There is not one sure way to prevent polyps. You might be  able to lower your risk of getting them if you:  Eat more fruits and vegetables and less fatty food.   Do not smoke.   Avoid alcohol.   Exercise every day.   Lose weight if you are overweight.   Eating more calcium and folate can also lower your risk of getting polyps. Some foods that are rich in calcium are milk, cheese, and broccoli. Some foods that are rich in folate are chickpeas, kidney beans, and spinach.   High-Fiber Diet A high-fiber diet changes your normal diet to include more whole grains, legumes, fruits, and vegetables. Changes in the diet involve replacing refined carbohydrates with unrefined foods. The calorie level of the diet is essentially unchanged. The Dietary Reference Intake (recommended amount) for adult males is 38 grams per day. For adult females, it is 25 grams per day. Pregnant and lactating women should consume 28 grams of fiber per day. Fiber is the intact part of a plant that is not broken down during digestion. Functional fiber is fiber that has been isolated from the plant to provide a beneficial effect in the body. PURPOSE  Increase stool bulk.   Ease and regulate bowel movements.   Lower cholesterol.  INDICATIONS THAT YOU NEED MORE FIBER  Constipation and hemorrhoids.   Uncomplicated diverticulosis (intestine condition) and irritable bowel syndrome.   Weight management.   As a protective measure against hardening of the arteries (atherosclerosis), diabetes, and cancer.   GUIDELINES FOR INCREASING FIBER IN THE DIET  Start adding fiber to the diet slowly. A gradual increase of about 5 more grams (2 slices of whole-wheat bread, 2 servings of most fruits or vegetables, or 1 bowl of high-fiber cereal) per day is best. Too rapid an increase in fiber may result in constipation, flatulence, and bloating.   Drink enough water and fluids to keep your urine clear or pale yellow. Water, juice, or caffeine-free drinks are recommended. Not drinking enough  fluid may cause constipation.   Eat a variety of high-fiber foods rather than one type of fiber.   Try to increase your intake of fiber through using high-fiber foods rather than fiber pills or supplements that contain small amounts of fiber.   The goal is to change the types of food eaten. Do not supplement your present diet with high-fiber foods, but replace foods in your present diet.  INCLUDE A VARIETY OF FIBER SOURCES  Replace refined and processed grains with whole grains, canned fruits with fresh fruits, and incorporate other fiber sources. White rice, white breads, and most bakery goods contain little or no fiber.   Brown whole-grain rice, buckwheat oats, and many fruits and vegetables are all good sources of fiber. These include: broccoli, Brussels sprouts, cabbage, cauliflower, beets, sweet potatoes, white potatoes (skin  on), carrots, tomatoes, eggplant, squash, berries, fresh fruits, and dried fruits.   Cereals appear to be the richest source of fiber. Cereal fiber is found in whole grains and bran. Bran is the fiber-rich outer coat of cereal grain, which is largely removed in refining. In whole-grain cereals, the bran remains. In breakfast cereals, the largest amount of fiber is found in those with "bran" in their names. The fiber content is sometimes indicated on the label.   You may need to include additional fruits and vegetables each day.   In baking, for 1 cup white flour, you may use the following substitutions:   1 cup whole-wheat flour minus 2 tablespoons.   1/2 cup white flour plus 1/2 cup whole-wheat flour.   Diverticulosis Diverticulosis is a common condition that develops when small pouches (diverticula) form in the wall of the colon. The risk of diverticulosis increases with age. It happens more often in people who eat a low-fiber diet. Most individuals with diverticulosis have no symptoms. Those individuals with symptoms usually experience belly (abdominal) pain,  constipation, or loose stools (diarrhea).  HOME CARE INSTRUCTIONS  Increase the amount of fiber in your diet as directed by your caregiver or dietician. This may reduce symptoms of diverticulosis.   Drink at least 6 to 8 glasses of water each day to prevent constipation.   Try not to strain when you have a bowel movement.   Avoiding nuts and seeds to prevent complications is still an uncertain benefit.       FOODS HAVING HIGH FIBER CONTENT INCLUDE:  Fruits. Apple, peach, pear, tangerine, raisins, prunes.   Vegetables. Brussels sprouts, asparagus, broccoli, cabbage, carrot, cauliflower, romaine lettuce, spinach, summer squash, tomato, winter squash, zucchini.   Starchy Vegetables. Baked beans, kidney beans, lima beans, split peas, lentils, potatoes (with skin).   Grains. Whole wheat bread, brown rice, bran flake cereal, plain oatmeal, white rice, shredded wheat, bran muffins.    SEEK IMMEDIATE MEDICAL CARE IF:  You develop increasing pain or severe bloating.   You have an oral temperature above 101F.   You develop vomiting or bowel movements that are bloody or black.   Hemorrhoids Hemorrhoids are dilated (enlarged) veins around the rectum. Sometimes clots will form in the veins. This makes them swollen and painful. These are called thrombosed hemorrhoids. Causes of hemorrhoids include:  Constipation.   Straining to have a bowel movement.   HEAVY LIFTING HOME CARE INSTRUCTIONS  Eat a well balanced diet and drink 6 to 8 glasses of water every day to avoid constipation. You may also use a bulk laxative.   Avoid straining to have bowel movements.   Keep anal area dry and clean.   Do not use a donut shaped pillow or sit on the toilet for long periods. This increases blood pooling and pain.   Move your bowels when your body has the urge; this will require less straining and will decrease pain and pressure.

## 2014-02-01 NOTE — Op Note (Signed)
[facility] [address] [place], [address]   COLONOSCOPY PROCEDURE REPORT  PATIENT: Rachel, [name]  MR#: [id number] BIRTHDATE: 1965/07/19 , 48  yrs. old GENDER: female ENDOSCOPIST: Danie Binder, MD REFERRED AU:QJFH Hilma Favors, M.D. PROCEDURE DATE:  02/06/14 PROCEDURE:   Colonoscopy with cold biopsy polypectomy INDICATIONS:average risk for colon cancer.-SISTER HAD POLYPS. MEDICATIONS: Demerol 75 mg IV and Versed 5 mg IV  DESCRIPTION OF PROCEDURE:    Physical exam was performed.  Informed consent was obtained from the patient after explaining the benefits, risks, and alternatives to procedure.  The patient was connected to monitor and placed in left lateral position. Continuous oxygen was provided by nasal cannula and IV medicine administered through an indwelling cannula.  After administration of sedation and rectal exam, the patients rectum was intubated and the EC-[id number] ([id number])  colonoscope was advanced under direct visualization to the ileum.  The scope was removed slowly by carefully examining the color, texture, anatomy, and integrity mucosa on the way out.  The patient was recovered in endoscopy and discharged home in satisfactory condition.    COLON FINDINGS: The examined terminal ileum appeared to be normal. , Seven sessile polyps ranging from 2 to 51mm in size were found in the rectum and at the hepatic flexure.  A polypectomy was performed with cold forceps.  , The colon was redundant.  Manual abdominal counter-pressure was used to reach the cecum, and Small internal hemorrhoids were found.  PREP QUALITY: good.  CECAL W/D TIME: 13     minutes   COMPLICATIONS: None  ENDOSCOPIC IMPRESSION: 1.  SEVEN POLYPS REMOVED 2.   The LEFT colon IS redundant 3.   Small internal hemorrhoids  RECOMMENDATIONS: FOLLOW A HIGH FIBER DIET.  AVOID ITEMS THAT CAUSE BLOATING. AWAIT BIOPSY. Next colonoscopy in 5 YEARS IF SHE HAS SIMPLE ADENOMAS AND 10 years IF  SHE HAS HYPERPLASTIC POLYPS.    _______________________________ eSignedDanie Binder, MD 2014/02/06 4:22 PM   CPT CODES: ICD CODES:  The ICD and CPT codes recommended by this software are interpretations from the data that the clinical staff has captured with the software.  The verification of the translation of this report to the ICD and CPT codes and modifiers is the sole responsibility of the health care institution and practicing physician where this report was generated.  [employer] will not be held responsible for the validity of the ICD and CPT codes included on this report.  AMA assumes no liability for data contained or not contained herein. CPT is a Designer, television/film set of the [employer].

## 2014-02-01 NOTE — H&P (Signed)
  Primary Care Physician:  Purvis Kilts, MD Primary Gastroenterologist:  Dr. Oneida Alar  Pre-Procedure History & Physical: HPI:  [name] is a 49 y.o. female here for [place]. Sister had polyps.  Past Medical History  Diagnosis Date  . Degenerative disk disease   . Hypertension   . GERD (gastroesophageal reflux disease)     Past Surgical History  Procedure Laterality Date  . Neck surgery    . Dilation and curettage of uterus    . Abdominal surgery    . Appendectomy    . Cholecystectomy    . Partial hysterectomy      Prior to Admission medications   Medication Sig Start Date End Date Taking? Authorizing Provider  polyethylene glycol-electrolytes (TRILYTE) 420 G solution Take 4,000 mLs by mouth as directed. 01/23/14  Yes Danie Binder, MD  Sod Picosulfate-Mag Ox-Cit Acd 10-3.5-12 MG-GM-GM PACK Take 1 Container by mouth as directed. 01/22/14   Danie Binder, MD  traMADol (ULTRAM) 50 MG tablet Take 50 mg by mouth every 8 (eight) hours as needed for moderate pain.    Historical Provider, MD    Allergies as of 01/17/2014 - Review Complete 01/17/2014  Allergen Reaction Noted  . Augmentin [amoxicillin-pot clavulanate] Nausea And Vomiting 01/17/2014  . Shellfish allergy Hives and Swelling 02/27/2011  . Ibuprofen Hives and Swelling 02/27/2011  . Unasyn [ampicillin-sulbactam sodium] Hives and Swelling 02/27/2011    History reviewed. No pertinent family history.  History   Social History  . Marital Status: Widowed    Spouse Name: N/A    Number of Children: N/A  . Years of Education: N/A   Occupational History  . Not on file.   Social History Main Topics  . Smoking status: Current Every Day Smoker -- 0.50 packs/day    Types: Cigarettes  . Smokeless tobacco: Not on file  . Alcohol Use: No  . Drug Use: No  . Sexual Activity: Not on file   Other Topics Concern  . Not on file   Social History Narrative    Review of Systems: See HPI, otherwise  negative ROS   Physical Exam: BP 127/86 mmHg  Pulse 73  Temp(Src) 98.2 F (36.8 C) (Oral)  Resp 23  Ht 5\' 7"  (1.702 m)  Wt 152 lb (68.947 kg)  BMI 23.80 kg/m2  SpO2 95% General:   Alert,  pleasant and cooperative in NAD Head:  Normocephalic and atraumatic. Neck:  Supple; Lungs:  Clear throughout to auscultation.    Heart:  Regular rate and rhythm. Abdomen:  Soft, nontender and nondistended. Normal bowel sounds, without guarding, and without rebound.   Neurologic:  Alert and  oriented x4;  grossly normal neurologically.  Impression/Plan:     SCREENING  Plan:  1. TCS TODAY

## 2014-02-04 ENCOUNTER — Encounter (HOSPITAL_COMMUNITY): Payer: Self-pay | Admitting: Gastroenterology

## 2014-02-13 ENCOUNTER — Telehealth: Payer: Self-pay | Admitting: Gastroenterology

## 2014-02-13 ENCOUNTER — Encounter: Payer: Self-pay | Admitting: Gastroenterology

## 2014-02-13 NOTE — Telephone Encounter (Signed)
Please call pt. She had simple adenomas removed. HIGH FIBER DIET. TCS in 5 years.

## 2014-02-13 NOTE — Telephone Encounter (Signed)
Pt is aware.  

## 2014-02-13 NOTE — Telephone Encounter (Signed)
Reminder in epic °

## 2014-10-06 ENCOUNTER — Emergency Department (HOSPITAL_COMMUNITY): Payer: Medicare HMO

## 2014-10-06 ENCOUNTER — Encounter (HOSPITAL_COMMUNITY): Payer: Self-pay | Admitting: Emergency Medicine

## 2014-10-06 ENCOUNTER — Emergency Department (HOSPITAL_COMMUNITY)
Admission: EM | Admit: 2014-10-06 | Discharge: 2014-10-06 | Disposition: A | Discharge status: Home / Self Care | Payer: Medicare HMO | Attending: Emergency Medicine | Admitting: Emergency Medicine

## 2014-10-06 DIAGNOSIS — Z8719 Personal history of other diseases of the digestive system: Secondary | ICD-10-CM | POA: Insufficient documentation

## 2014-10-06 DIAGNOSIS — J189 Pneumonia, unspecified organism: Secondary | ICD-10-CM

## 2014-10-06 DIAGNOSIS — Z720 Tobacco use: Secondary | ICD-10-CM | POA: Insufficient documentation

## 2014-10-06 DIAGNOSIS — I10 Essential (primary) hypertension: Secondary | ICD-10-CM | POA: Insufficient documentation

## 2014-10-06 DIAGNOSIS — J159 Unspecified bacterial pneumonia: Secondary | ICD-10-CM | POA: Diagnosis not present

## 2014-10-06 DIAGNOSIS — R509 Fever, unspecified: Secondary | ICD-10-CM | POA: Diagnosis present

## 2014-10-06 DIAGNOSIS — H9202 Otalgia, left ear: Secondary | ICD-10-CM | POA: Diagnosis not present

## 2014-10-06 LAB — CBC WITH DIFFERENTIAL/PLATELET
Basophils Absolute: 0 10*3/uL (ref 0.0–0.1)
Basophils Relative: 0 %
EOS ABS: 0.3 10*3/uL (ref 0.0–0.7)
Eosinophils Relative: 2 %
HEMATOCRIT: 37.8 % (ref 36.0–46.0)
Hemoglobin: 12.9 g/dL (ref 12.0–15.0)
Lymphocytes Relative: 18 %
Lymphs Abs: 2 10*3/uL (ref 0.7–4.0)
MCH: 30.2 pg (ref 26.0–34.0)
MCHC: 34.1 g/dL (ref 30.0–36.0)
MCV: 88.5 fL (ref 78.0–100.0)
MONO ABS: 0.7 10*3/uL (ref 0.1–1.0)
MONOS PCT: 6 %
Neutro Abs: 8.6 10*3/uL — ABNORMAL HIGH (ref 1.7–7.7)
Neutrophils Relative %: 74 %
Platelets: 228 10*3/uL (ref 150–400)
RBC: 4.27 MIL/uL (ref 3.87–5.11)
RDW: 12.9 % (ref 11.5–15.5)
WBC: 11.6 10*3/uL — ABNORMAL HIGH (ref 4.0–10.5)

## 2014-10-06 LAB — URINALYSIS, ROUTINE W REFLEX MICROSCOPIC
Glucose, UA: NEGATIVE mg/dL
Ketones, ur: NEGATIVE mg/dL
Leukocytes, UA: NEGATIVE
NITRITE: NEGATIVE
PH: 6.5 (ref 5.0–8.0)
Protein, ur: 100 mg/dL — AB
SPECIFIC GRAVITY, URINE: 1.02 (ref 1.005–1.030)
Urobilinogen, UA: 4 mg/dL — ABNORMAL HIGH (ref 0.0–1.0)

## 2014-10-06 LAB — INFLUENZA PANEL BY PCR (TYPE A & B)
H1N1 flu by pcr: NOT DETECTED
INFLAPCR: NEGATIVE
INFLBPCR: NEGATIVE

## 2014-10-06 LAB — URINE MICROSCOPIC-ADD ON

## 2014-10-06 MED ORDER — LEVOFLOXACIN 500 MG PO TABS: 500.0000 mg | ORAL_TABLET | Freq: Every day | ORAL | Status: DC

## 2014-10-06 MED ORDER — HYDROCODONE-ACETAMINOPHEN 5-325 MG PO TABS
1.0000 | ORAL_TABLET | ORAL | Status: DC | PRN
Start: 2014-10-06 — End: 2016-04-26

## 2014-10-06 MED ORDER — LEVOFLOXACIN 500 MG PO TABS
500.0000 mg | ORAL_TABLET | Freq: Once | ORAL | Status: AC
  Administered 2014-10-06: 500 mg via ORAL
  Filled 2014-10-06: qty 1

## 2014-10-06 MED ORDER — ACETAMINOPHEN 325 MG PO TABS
650.0000 mg | ORAL_TABLET | Freq: Once | ORAL | Status: AC
  Administered 2014-10-06: 650 mg via ORAL
  Filled 2014-10-06: qty 2

## 2014-10-06 NOTE — ED Provider Notes (Signed)
CSN: [id number]     Arrival date & time 10/06/14  1235 History  By signing my name below, I, [name], attest that this documentation has been prepared under the direction and in the presence of [name], PA-C. Electronically Signed: [name], ED Scribe. 10/06/2014. 3:07 PM.    Chief Complaint  Patient presents with  . Fever   Patient is a 49 y.o. female presenting with fever. The history is provided by the patient. No language interpreter was used.  Fever Max temp prior to arrival:  "105," per pt Severity:  Moderate Onset quality:  Gradual Duration:  3 days Timing:  Constant Progression:  Waxing and waning Chronicity:  New Relieved by:  None tried Worsened by:  Nothing tried Ineffective treatments:  None tried Associated symptoms: chills, ear pain, headaches and myalgias (generalized)   Associated symptoms: no cough, no diarrhea, no nausea, no rhinorrhea, no sore throat and no vomiting   Risk factors: no recent travel     HPI Comments: BRONNIE [name] is a 49 y.o. female who presents to the Emergency Department complaining of a fever that began 3 days ago. Associated symptoms include left otalgia, left-sided headache, and generalized myalgias. She reports her temperature was "103" on the day her fever began and "105" last night. She denies a history of cardiac, kidney, or blood-related diseases. She also denies any recent international travel. She denies rhinorrhea, sore throat, cough, abdominal pain, nausea, vomiting, diarrhea, skin ulcerations, or dental problems.      Past Medical History  Diagnosis Date  . Degenerative disk disease   . Hypertension   . GERD (gastroesophageal reflux disease)    Past Surgical History  Procedure Laterality Date  . Neck surgery    . Dilation and curettage of uterus    . Abdominal surgery    . Appendectomy    . Cholecystectomy    . Partial hysterectomy    . Colonoscopy N/A 02/01/2014    2 SIMPLE ADNEOMA(HF), HYEPRPLASTIC RECTAL  POLYPS   History reviewed. No pertinent family history. Social History  Substance Use Topics  . Smoking status: Current Every Day Smoker -- 0.50 packs/day    Types: Cigarettes  . Smokeless tobacco: None  . Alcohol Use: No   OB History    Gravida Para Term Preterm AB TAB SAB Ectopic Multiple Living            2     Review of Systems  Constitutional: Positive for fever and chills.  HENT: Positive for ear pain. Negative for dental problem, rhinorrhea and sore throat.   Respiratory: Negative for cough.   Gastrointestinal: Negative for nausea, vomiting, abdominal pain and diarrhea.  Musculoskeletal: Positive for myalgias (generalized).  Neurological: Positive for headaches.  All other systems reviewed and are negative.     Allergies  Augmentin; Shellfish allergy; Ibuprofen; and Unasyn  Home Medications   Prior to Admission medications   Medication Sig Start Date End Date Taking? Authorizing Provider  traMADol (ULTRAM) 50 MG tablet Take 50 mg by mouth every 8 (eight) hours as needed for moderate pain.    Historical Provider, MD   BP 131/67 mmHg  Pulse 95  Temp(Src) 100.4 F (38 C) (Oral)  Resp 18  Ht 5\' 7"  (1.702 m)  Wt 162 lb (73.483 kg)  BMI 25.37 kg/m2  SpO2 94% Physical Exam  Constitutional: She is oriented to person, place, and time. She appears well-developed and well-nourished. No distress.  HENT:  Head: Normocephalic and atraumatic.  Ears  are clear. No mastoid redness or swelling. Minimal nasal congestion. No facial asymmetry. Pain to palpation of the left face. The oropharynx is clear. Speech is clear.   Eyes: Conjunctivae and EOM are normal. Pupils are equal, round, and reactive to light.  Pupils are equal. Conjunctiva is clear.   Neck: Neck supple. No tracheal deviation present.  No cervical lymphadenopathy.  Cardiovascular: Tachycardia present.   There is tachycardia present.  Pulmonary/Chest: Effort normal. No respiratory distress. She has no decreased  breath sounds. She has no wheezes. She has no rhonchi. She has no rales.  Course breath sounds with occasional rhonchi. There is symmetrical rise and fall of the chest. Patient speaks in complete sentences.  Abdominal:  Abdomen is soft. Good bowel sounds. No mass.   Musculoskeletal: Normal range of motion.  No hot joints. No edema.   Lymphadenopathy:    She has no cervical adenopathy.  Neurological: She is alert and oriented to person, place, and time.  Skin: Skin is warm and dry.  Psychiatric: She has a normal mood and affect. Her behavior is normal.  Nursing note and vitals reviewed.   ED Course  Procedures (including critical care time)  DIAGNOSTIC STUDIES: Oxygen Saturation is 94% on RA, adequate by my interpretation.    COORDINATION OF CARE: 1:09 PM - Discussed treatment plan with pt at bedside which includes UA, CXR, and influenza screen. Will administer Tylenol to reduce fever. Pt verbalized understanding and agreed to plan.   3:05 PM - XR shows pneumonia in left lower lung. Plan is Rx Levaquin, Tylenol, and Norco. Will send urine culture. Pt verbalized understanding and agreed to plan.    Labs Review Labs Reviewed  CBC WITH DIFFERENTIAL/PLATELET - Abnormal; Notable for the following:    WBC 11.6 (*)    Neutro Abs 8.6 (*)    All other components within normal limits  URINALYSIS, ROUTINE W REFLEX MICROSCOPIC (NOT AT [facility]) - Abnormal; Notable for the following:    Color, Urine AMBER (*)    Hgb urine dipstick MODERATE (*)    Bilirubin Urine SMALL (*)    Protein, ur 100 (*)    Urobilinogen, UA 4.0 (*)    All other components within normal limits  URINE MICROSCOPIC-ADD ON - Abnormal; Notable for the following:    Squamous Epithelial / LPF FEW (*)    Bacteria, UA MANY (*)    All other components within normal limits  INFLUENZA PANEL BY PCR (TYPE A & B, H1N1)(NOT AT [facility])    Imaging Review Dg Chest 2 View  10/06/2014   CLINICAL DATA:  Fever.  EXAM: CHEST  2 VIEW   COMPARISON:  04/24/2008 chest radiograph  FINDINGS: Surgical plate with interlocking screws overlies the lower cervical spine. Stable cardiomediastinal silhouette with normal heart size. No pneumothorax. No pleural effusion. There is patchy left lower lobe consolidation. Clear right lung. No pulmonary edema. Cholecystectomy clips are seen in the right upper quadrant of the abdomen.  IMPRESSION: Patchy left lower lobe lung consolidation, most in keeping with a left lower lobe pneumonia. Recommend follow-up post treatment PA and lateral chest radiographs in 6-8 weeks.   Electronically Signed   By: Ilona Sorrel M.D.   On: 10/06/2014 14:32   I have personally reviewed and evaluated these images and lab results as part of my medical decision-making.  MDM   review of the chest x-ray reveals a left lower lobe pneumonia. The temperature has improved from 100.4 299.8 with Tylenol. The pulse oximetry was 94%  on room air. The patient complained of left ear pain and left-sided headache, the only changes that were noted were that there are some teeth that are at an angle on the left. There is no abscess appreciated. There was no evidence of ear infection. There is no mastoid involvement.  The patient will be treated with Levaquin and Norco. The patient is to follow-up with her primary physician in about 4 days. The patient is to return to the emergency department if any changes, problems, or concerns.    Final diagnoses:  None    **I personally performed the services described in this documentation, which was scribed in my presence. The recorded information has been reviewed and is accurate.* I have reviewed nursing notes, vital signs, and all appropriate lab and imaging results for this patient.   [name], PA-C 10/07/14 [id number]  Milton Ferguson, MD 10/07/14 [phone]

## 2014-10-06 NOTE — ED Notes (Addendum)
PT c/o fever with generalized body aches, left ear pain and left sided headache x4 days.  PT states taking tylenol at 0930 today.

## 2014-10-06 NOTE — Discharge Instructions (Signed)
Your x-ray reveals a left lung pneumonia. Please use Levaquin daily with food until all taken. Use Tylenol every 4 hours for fever and generalized aching. Use Norco for more severe aching. Please increase your fluids, including water, juices Gatorade. Please see Dr. Hilma Favors in 5-7 days for recheck concerning your pneumonia. Please use a mask until symptoms have resolved. Pneumonia, Adult Pneumonia is an infection of the lungs. It may be caused by a germ (virus or bacteria). Some types of pneumonia can spread easily from person to person. This can happen when you cough or sneeze. HOME CARE  Only take medicine as told by your doctor.  Take your medicine (antibiotics) as told. Finish it even if you start to feel better.  Do not smoke.  You may use a vaporizer or humidifier in your room. This can help loosen thick spit (mucus).  Sleep so you are almost sitting up (semi-upright). This helps reduce coughing.  Rest. A shot (vaccine) can help prevent pneumonia. Shots are often advised for:  People over 27 years old.  Patients on chemotherapy.  People with long-term (chronic) lung problems.  People with immune system problems. GET HELP RIGHT AWAY IF:   You are getting worse.  You cannot control your cough, and you are losing sleep.  You cough up blood.  Your pain gets worse, even with medicine.  You have a fever.  Any of your problems are getting worse, not better.  You have shortness of breath or chest pain. MAKE SURE YOU:   Understand these instructions.  Will watch your condition.  Will get help right away if you are not doing well or get worse. Document Released: 06/09/2007 Document Revised: 03/15/2011 Document Reviewed: 03/13/2010 [facility] Patient Information 2015 [place], [place]. This information is not intended to replace advice given to you by your health care provider. Make sure you discuss any questions you have with your health care provider.

## 2015-01-16 DIAGNOSIS — Z1389 Encounter for screening for other disorder: Secondary | ICD-10-CM | POA: Diagnosis not present

## 2015-01-16 DIAGNOSIS — J069 Acute upper respiratory infection, unspecified: Secondary | ICD-10-CM | POA: Diagnosis not present

## 2015-01-16 DIAGNOSIS — Z6825 Body mass index (BMI) 25.0-25.9, adult: Secondary | ICD-10-CM | POA: Diagnosis not present

## 2015-01-16 DIAGNOSIS — J209 Acute bronchitis, unspecified: Secondary | ICD-10-CM | POA: Diagnosis not present

## 2015-01-31 ENCOUNTER — Other Ambulatory Visit (HOSPITAL_COMMUNITY): Payer: Self-pay | Admitting: Family Medicine

## 2015-01-31 ENCOUNTER — Ambulatory Visit (HOSPITAL_COMMUNITY)
Admission: RE | Admit: 2015-01-31 | Discharge: 2015-01-31 | Disposition: A | Discharge status: Home / Self Care | Payer: Commercial Managed Care - HMO | Source: Ambulatory Visit | Attending: Family Medicine | Admitting: Family Medicine

## 2015-01-31 DIAGNOSIS — J069 Acute upper respiratory infection, unspecified: Secondary | ICD-10-CM | POA: Insufficient documentation

## 2015-01-31 DIAGNOSIS — Z6825 Body mass index (BMI) 25.0-25.9, adult: Secondary | ICD-10-CM | POA: Diagnosis not present

## 2015-01-31 DIAGNOSIS — R05 Cough: Secondary | ICD-10-CM | POA: Diagnosis not present

## 2015-01-31 DIAGNOSIS — R0602 Shortness of breath: Secondary | ICD-10-CM | POA: Diagnosis not present

## 2015-01-31 DIAGNOSIS — Z1389 Encounter for screening for other disorder: Secondary | ICD-10-CM | POA: Diagnosis not present

## 2015-01-31 DIAGNOSIS — E663 Overweight: Secondary | ICD-10-CM | POA: Diagnosis not present

## 2015-02-16 ENCOUNTER — Encounter (HOSPITAL_COMMUNITY): Payer: Self-pay | Admitting: Emergency Medicine

## 2015-02-16 ENCOUNTER — Emergency Department (HOSPITAL_COMMUNITY)
Admission: EM | Admit: 2015-02-16 | Discharge: 2015-02-16 | Disposition: A | Discharge status: Home / Self Care | Payer: Commercial Managed Care - HMO | Attending: Emergency Medicine | Admitting: Emergency Medicine

## 2015-02-16 DIAGNOSIS — I10 Essential (primary) hypertension: Secondary | ICD-10-CM | POA: Insufficient documentation

## 2015-02-16 DIAGNOSIS — R42 Dizziness and giddiness: Secondary | ICD-10-CM | POA: Insufficient documentation

## 2015-02-16 DIAGNOSIS — Z792 Long term (current) use of antibiotics: Secondary | ICD-10-CM | POA: Diagnosis not present

## 2015-02-16 DIAGNOSIS — F17210 Nicotine dependence, cigarettes, uncomplicated: Secondary | ICD-10-CM | POA: Diagnosis not present

## 2015-02-16 DIAGNOSIS — Z8719 Personal history of other diseases of the digestive system: Secondary | ICD-10-CM | POA: Insufficient documentation

## 2015-02-16 DIAGNOSIS — Z79899 Other long term (current) drug therapy: Secondary | ICD-10-CM | POA: Insufficient documentation

## 2015-02-16 DIAGNOSIS — Z8739 Personal history of other diseases of the musculoskeletal system and connective tissue: Secondary | ICD-10-CM | POA: Diagnosis not present

## 2015-02-16 LAB — CBC WITH DIFFERENTIAL/PLATELET
Basophils Absolute: 0 10*3/uL (ref 0.0–0.1)
Basophils Relative: 0 %
EOS PCT: 1 %
Eosinophils Absolute: 0.2 10*3/uL (ref 0.0–0.7)
HEMATOCRIT: 40.7 % (ref 36.0–46.0)
Hemoglobin: 13.5 g/dL (ref 12.0–15.0)
LYMPHS ABS: 5.4 10*3/uL — AB (ref 0.7–4.0)
LYMPHS PCT: 32 %
MCH: 30.2 pg (ref 26.0–34.0)
MCHC: 33.2 g/dL (ref 30.0–36.0)
MCV: 91.1 fL (ref 78.0–100.0)
MONO ABS: 1.1 10*3/uL — AB (ref 0.1–1.0)
Monocytes Relative: 7 %
Neutro Abs: 10.2 10*3/uL — ABNORMAL HIGH (ref 1.7–7.7)
Neutrophils Relative %: 60 %
PLATELETS: 245 10*3/uL (ref 150–400)
RBC: 4.47 MIL/uL (ref 3.87–5.11)
RDW: 13.4 % (ref 11.5–15.5)
WBC: 16.9 10*3/uL — ABNORMAL HIGH (ref 4.0–10.5)

## 2015-02-16 LAB — COMPREHENSIVE METABOLIC PANEL
ALT: 25 U/L (ref 14–54)
AST: 18 U/L (ref 15–41)
Albumin: 3.6 g/dL (ref 3.5–5.0)
Alkaline Phosphatase: 94 U/L (ref 38–126)
Anion gap: 10 (ref 5–15)
BILIRUBIN TOTAL: 0.4 mg/dL (ref 0.3–1.2)
BUN: 13 mg/dL (ref 6–20)
CHLORIDE: 100 mmol/L — AB (ref 101–111)
CO2: 29 mmol/L (ref 22–32)
Calcium: 9.3 mg/dL (ref 8.9–10.3)
Creatinine, Ser: 1.2 mg/dL — ABNORMAL HIGH (ref 0.44–1.00)
GFR, EST AFRICAN AMERICAN: 60 mL/min — AB (ref >60)
GFR, EST NON AFRICAN AMERICAN: 51 mL/min — AB (ref >60)
Glucose, Bld: 95 mg/dL (ref 65–99)
POTASSIUM: 3.2 mmol/L — AB (ref 3.5–5.1)
Sodium: 139 mmol/L (ref 135–145)
TOTAL PROTEIN: 7.1 g/dL (ref 6.5–8.1)

## 2015-02-16 LAB — TROPONIN I: Troponin I: <0.03 ng/mL (ref <0.031)

## 2015-02-16 MED ORDER — SODIUM CHLORIDE 0.9 % IV SOLN
1000.0000 mL | INTRAVENOUS | Status: DC
  Administered 2015-02-16: 1000 mL via INTRAVENOUS

## 2015-02-16 MED ORDER — MECLIZINE HCL 50 MG PO TABS: 25.0000 mg | ORAL_TABLET | Freq: Three times a day (TID) | ORAL | Status: AC | PRN

## 2015-02-16 MED ORDER — SODIUM CHLORIDE 0.9 % IV SOLN
1000.0000 mL | Freq: Once | INTRAVENOUS | Status: AC
  Administered 2015-02-16: 1000 mL via INTRAVENOUS

## 2015-02-16 NOTE — Discharge Instructions (Signed)

## 2015-02-16 NOTE — ED Provider Notes (Signed)
CSN: [id number]     Arrival date & time 02/16/15  1515 History   First MD Initiated Contact with Patient 02/16/15 1553     Chief Complaint  Patient presents with  . Dizziness    HPI Pt noticed that she started to feel lightheaded as if she was going to pass out last night.  The symptoms continued today.  Nothing seemed to trigger it.   She did notice she had some acid reflux when it first started and she tried mylanta.  No vomiting.  No chest pain or shortness of breath.  No blood in the stool.  No melena.  No headache, no speech issues.  No focal weakness.  It is better when she is sitting at rest. Past Medical History  Diagnosis Date  . Degenerative disk disease   . Hypertension   . GERD (gastroesophageal reflux disease)    Past Surgical History  Procedure Laterality Date  . Neck surgery    . Dilation and curettage of uterus    . Abdominal surgery    . Appendectomy    . Cholecystectomy    . Partial hysterectomy    . Colonoscopy N/A 02/01/2014    2 SIMPLE ADNEOMA(HF), HYEPRPLASTIC RECTAL POLYPS   History reviewed. No pertinent family history. Social History  Substance Use Topics  . Smoking status: Current Every Day Smoker -- 0.50 packs/day    Types: Cigarettes  . Smokeless tobacco: Never Used  . Alcohol Use: No   OB History    Gravida Para Term Preterm AB TAB SAB Ectopic Multiple Living            2     Review of Systems  Neurological: Positive for light-headedness.       No vertigo  All other systems reviewed and are negative.     Allergies  Augmentin; Shellfish allergy; Ibuprofen; and Unasyn  Home Medications   Prior to Admission medications   Medication Sig Start Date End Date Taking? Authorizing Provider  HYDROcodone-acetaminophen (NORCO/VICODIN) 5-325 MG tablet Take 1 tablet by mouth every 4 (four) hours as needed. 10/06/14   [name], PA-C  levofloxacin (LEVAQUIN) 500 MG tablet Take 1 tablet (500 mg total) by mouth daily. 10/06/14   [name], PA-C   meclizine (ANTIVERT) 50 MG tablet Take 0.5 tablets (25 mg total) by mouth 3 (three) times daily as needed for dizziness or nausea. 02/16/15   Dorie Rank, MD  oseltamivir (TAMIFLU) 75 MG capsule Take 75 mg by mouth daily.    Historical Provider, MD  traMADol (ULTRAM) 50 MG tablet Take 50 mg by mouth every 8 (eight) hours as needed for moderate pain.    Historical Provider, MD   BP 137/91 mmHg  Pulse 84  Temp(Src) 98.8 F (37.1 C) (Oral)  Resp 17  Ht 5\' 7"  (1.702 m)  Wt 74.39 kg  BMI 25.68 kg/m2  SpO2 99% Physical Exam  Constitutional: She appears well-developed and well-nourished. No distress.  HENT:  Head: Normocephalic and atraumatic.  Right Ear: External ear normal.  Left Ear: External ear normal.  Eyes: Conjunctivae are normal. Right eye exhibits no discharge. Left eye exhibits no discharge. No scleral icterus.  Neck: Neck supple. No tracheal deviation present.  Cardiovascular: Normal rate, regular rhythm and intact distal pulses.   Pulmonary/Chest: Effort normal and breath sounds normal. No stridor. No respiratory distress. She has no wheezes. She has no rales.  Abdominal: Soft. Bowel sounds are normal. She exhibits no distension. There is no tenderness. There is  no rebound and no guarding.  Musculoskeletal: She exhibits no edema or tenderness.  Neurological: She is alert. She has normal strength. No cranial nerve deficit (no facial droop, extraocular movements intact, no slurred speech) or sensory deficit. She exhibits normal muscle tone. She displays no seizure activity. Coordination normal.  Equal strength in all 4 extremities, no facial droop, normal coordination  Skin: Skin is warm and dry. No rash noted.  Psychiatric: She has a normal mood and affect.  Nursing note and vitals reviewed.   ED Course  Procedures (including critical care time) Labs Review Labs Reviewed  CBC WITH DIFFERENTIAL/PLATELET - Abnormal; Notable for the following:    WBC 16.9 (*)    Neutro Abs 10.2  (*)    Lymphs Abs 5.4 (*)    Monocytes Absolute 1.1 (*)    All other components within normal limits  COMPREHENSIVE METABOLIC PANEL - Abnormal; Notable for the following:    Potassium 3.2 (*)    Chloride 100 (*)    Creatinine, Ser 1.20 (*)    GFR calc non Af Amer 51 (*)    GFR calc Af Amer 60 (*)    All other components within normal limits  TROPONIN I    Imaging Review No results found. I have personally reviewed and evaluated these images and lab results as part of my medical decision-making.   EKG Interpretation   Date/Time:  Sunday February 16 2015 15:36:19 EST Ventricular Rate:  98 PR Interval:    QRS Duration: 70 QT Interval:  334 QTC Calculation: 426 R Axis:   74 Text Interpretation:  Sinus rhythm No previous tracing Confirmed by Donette Mainwaring   MD-J, Mallori Araque [id number]) on 02/16/2015 4:16:35 PM      MDM   Final diagnoses:  Dizziness    Pt complains of near syncopal type symptoms.  Not clearly vertigo.  Exam is normal.  Increased WBC on labs but no acute infection identified.  Not dehydrated or orthostatic.  Normal heart rhythm.  At this time there does not appear to be any evidence of an acute emergency medical condition and the patient appears stable for discharge with appropriate outpatient follow up.     Dorie Rank, MD 02/16/15 [phone]

## 2015-02-16 NOTE — ED Notes (Signed)
Patient c/o dizziness. Per patient was dizzy yesterday and went to sleep last night at 8pm thinking dizziness would subside but woke to dizziness again. Patient denies any slurred speech, facial drooping, weakness, or headache. Patient does state that she has some pressure in head and dizziness is worse upon standing.

## 2015-04-04 DIAGNOSIS — Z1389 Encounter for screening for other disorder: Secondary | ICD-10-CM | POA: Diagnosis not present

## 2015-04-04 DIAGNOSIS — E663 Overweight: Secondary | ICD-10-CM | POA: Diagnosis not present

## 2015-04-04 DIAGNOSIS — Z6826 Body mass index (BMI) 26.0-26.9, adult: Secondary | ICD-10-CM | POA: Diagnosis not present

## 2015-04-04 DIAGNOSIS — M5136 Other intervertebral disc degeneration, lumbar region: Secondary | ICD-10-CM | POA: Diagnosis not present

## 2015-06-27 DIAGNOSIS — M545 Low back pain: Secondary | ICD-10-CM | POA: Diagnosis not present

## 2015-06-30 DIAGNOSIS — M545 Low back pain: Secondary | ICD-10-CM | POA: Diagnosis not present

## 2015-06-30 DIAGNOSIS — M5440 Lumbago with sciatica, unspecified side: Secondary | ICD-10-CM | POA: Diagnosis not present

## 2015-06-30 DIAGNOSIS — M549 Dorsalgia, unspecified: Secondary | ICD-10-CM | POA: Diagnosis not present

## 2015-06-30 DIAGNOSIS — M5417 Radiculopathy, lumbosacral region: Secondary | ICD-10-CM | POA: Diagnosis not present

## 2015-07-02 DIAGNOSIS — Z1389 Encounter for screening for other disorder: Secondary | ICD-10-CM | POA: Diagnosis not present

## 2015-07-02 DIAGNOSIS — I10 Essential (primary) hypertension: Secondary | ICD-10-CM | POA: Diagnosis not present

## 2015-07-02 DIAGNOSIS — Z6825 Body mass index (BMI) 25.0-25.9, adult: Secondary | ICD-10-CM | POA: Diagnosis not present

## 2015-07-02 DIAGNOSIS — E663 Overweight: Secondary | ICD-10-CM | POA: Diagnosis not present

## 2015-07-03 DIAGNOSIS — M5440 Lumbago with sciatica, unspecified side: Secondary | ICD-10-CM | POA: Diagnosis not present

## 2015-07-03 DIAGNOSIS — M545 Low back pain: Secondary | ICD-10-CM | POA: Diagnosis not present

## 2015-07-03 DIAGNOSIS — M549 Dorsalgia, unspecified: Secondary | ICD-10-CM | POA: Diagnosis not present

## 2015-07-03 DIAGNOSIS — M5417 Radiculopathy, lumbosacral region: Secondary | ICD-10-CM | POA: Diagnosis not present

## 2015-07-07 DIAGNOSIS — M5417 Radiculopathy, lumbosacral region: Secondary | ICD-10-CM | POA: Diagnosis not present

## 2015-07-07 DIAGNOSIS — M545 Low back pain: Secondary | ICD-10-CM | POA: Diagnosis not present

## 2015-07-07 DIAGNOSIS — M5440 Lumbago with sciatica, unspecified side: Secondary | ICD-10-CM | POA: Diagnosis not present

## 2015-07-07 DIAGNOSIS — M549 Dorsalgia, unspecified: Secondary | ICD-10-CM | POA: Diagnosis not present

## 2015-07-09 DIAGNOSIS — M5417 Radiculopathy, lumbosacral region: Secondary | ICD-10-CM | POA: Diagnosis not present

## 2015-07-09 DIAGNOSIS — M5440 Lumbago with sciatica, unspecified side: Secondary | ICD-10-CM | POA: Diagnosis not present

## 2015-07-09 DIAGNOSIS — M549 Dorsalgia, unspecified: Secondary | ICD-10-CM | POA: Diagnosis not present

## 2015-07-09 DIAGNOSIS — M545 Low back pain: Secondary | ICD-10-CM | POA: Diagnosis not present

## 2015-07-10 DIAGNOSIS — M545 Low back pain: Secondary | ICD-10-CM | POA: Diagnosis not present

## 2015-07-10 DIAGNOSIS — M5417 Radiculopathy, lumbosacral region: Secondary | ICD-10-CM | POA: Diagnosis not present

## 2015-07-10 DIAGNOSIS — M5440 Lumbago with sciatica, unspecified side: Secondary | ICD-10-CM | POA: Diagnosis not present

## 2015-07-10 DIAGNOSIS — M549 Dorsalgia, unspecified: Secondary | ICD-10-CM | POA: Diagnosis not present

## 2015-07-15 DIAGNOSIS — M549 Dorsalgia, unspecified: Secondary | ICD-10-CM | POA: Diagnosis not present

## 2015-07-15 DIAGNOSIS — M545 Low back pain: Secondary | ICD-10-CM | POA: Diagnosis not present

## 2015-07-15 DIAGNOSIS — M5440 Lumbago with sciatica, unspecified side: Secondary | ICD-10-CM | POA: Diagnosis not present

## 2015-07-15 DIAGNOSIS — M5417 Radiculopathy, lumbosacral region: Secondary | ICD-10-CM | POA: Diagnosis not present

## 2015-07-17 DIAGNOSIS — M5440 Lumbago with sciatica, unspecified side: Secondary | ICD-10-CM | POA: Diagnosis not present

## 2015-07-17 DIAGNOSIS — M549 Dorsalgia, unspecified: Secondary | ICD-10-CM | POA: Diagnosis not present

## 2015-07-17 DIAGNOSIS — M5417 Radiculopathy, lumbosacral region: Secondary | ICD-10-CM | POA: Diagnosis not present

## 2015-07-17 DIAGNOSIS — M545 Low back pain: Secondary | ICD-10-CM | POA: Diagnosis not present

## 2015-08-01 DIAGNOSIS — M545 Low back pain: Secondary | ICD-10-CM | POA: Diagnosis not present

## 2015-08-01 DIAGNOSIS — M5136 Other intervertebral disc degeneration, lumbar region: Secondary | ICD-10-CM | POA: Diagnosis not present

## 2015-09-29 DIAGNOSIS — M5410 Radiculopathy, site unspecified: Secondary | ICD-10-CM | POA: Diagnosis not present

## 2015-09-29 DIAGNOSIS — Z6826 Body mass index (BMI) 26.0-26.9, adult: Secondary | ICD-10-CM | POA: Diagnosis not present

## 2015-09-29 DIAGNOSIS — I10 Essential (primary) hypertension: Secondary | ICD-10-CM | POA: Diagnosis not present

## 2015-09-29 DIAGNOSIS — E663 Overweight: Secondary | ICD-10-CM | POA: Diagnosis not present

## 2015-09-29 DIAGNOSIS — M5416 Radiculopathy, lumbar region: Secondary | ICD-10-CM | POA: Diagnosis not present

## 2015-09-29 DIAGNOSIS — M47816 Spondylosis without myelopathy or radiculopathy, lumbar region: Secondary | ICD-10-CM | POA: Diagnosis not present

## 2015-11-07 ENCOUNTER — Ambulatory Visit (HOSPITAL_BASED_OUTPATIENT_CLINIC_OR_DEPARTMENT_OTHER): Payer: Commercial Managed Care - HMO | Admitting: Physical Medicine & Rehabilitation

## 2015-11-07 ENCOUNTER — Encounter: Payer: Self-pay | Admitting: Physical Medicine & Rehabilitation

## 2015-11-07 ENCOUNTER — Encounter: Payer: Commercial Managed Care - HMO | Attending: Physical Medicine & Rehabilitation

## 2015-11-07 VITALS — BP 126/86 | HR 88 | Resp 14

## 2015-11-07 DIAGNOSIS — Z90711 Acquired absence of uterus with remaining cervical stump: Secondary | ICD-10-CM | POA: Diagnosis not present

## 2015-11-07 DIAGNOSIS — G8929 Other chronic pain: Secondary | ICD-10-CM | POA: Insufficient documentation

## 2015-11-07 DIAGNOSIS — M25552 Pain in left hip: Secondary | ICD-10-CM | POA: Diagnosis not present

## 2015-11-07 DIAGNOSIS — I10 Essential (primary) hypertension: Secondary | ICD-10-CM | POA: Insufficient documentation

## 2015-11-07 DIAGNOSIS — K219 Gastro-esophageal reflux disease without esophagitis: Secondary | ICD-10-CM | POA: Insufficient documentation

## 2015-11-07 DIAGNOSIS — Z79891 Long term (current) use of opiate analgesic: Secondary | ICD-10-CM | POA: Insufficient documentation

## 2015-11-07 DIAGNOSIS — F17210 Nicotine dependence, cigarettes, uncomplicated: Secondary | ICD-10-CM | POA: Insufficient documentation

## 2015-11-07 DIAGNOSIS — M47816 Spondylosis without myelopathy or radiculopathy, lumbar region: Secondary | ICD-10-CM | POA: Insufficient documentation

## 2015-11-07 DIAGNOSIS — Z9049 Acquired absence of other specified parts of digestive tract: Secondary | ICD-10-CM | POA: Diagnosis not present

## 2015-11-07 DIAGNOSIS — Z5181 Encounter for therapeutic drug level monitoring: Secondary | ICD-10-CM

## 2015-11-07 NOTE — Progress Notes (Signed)
Subjective:    Patient ID: [name], female    DOB: [birth year], 50 y.o.   MRN: [id number]  HPI Chief complaint is low back pain as well as left hip pain 50 year old female who I had followed previously mainly for neck pain as well as low back pain in 2009 who presents today with increasing pain in the low back and left hip. Since I last saw the patient in 2009. She underwent cervical spine surgery , C5-C7 ACDF by Dr. Earle Gell. Prior C4-5 ACDF Last cervical spine. 01/08/2014 reviewed. Evidence of some mild adjacent level spondylosis at C3-C4  Patient has had several MRIs of the lumbar spine. Most recently 08/01/2015, which showed normal levels at L1-2, L2-3, mild facet arthropathy at L3-4, mild disc degeneration L4-5 with facet arthropathy, advanced disc space narrowing. L5-S1, with facet arthropathy. Mild foraminal narrowing on the right. Patient underwent lumbar epidural injections I, Dr. Clydell Hakim on 3 occasions. Do not have the notes as to which level.  Patient indicates that orthopedics has evaluated her left hip pain. Diagnosed with left hip arthritis and told that she needs a replacement. She received both fluoroscopy guided hip injection as well as what sounds like a left hip bursa injection. Both of these were helpful. Her current main pain is in the left groin area, although she does also have some pain when sleeping on the left side at night  No recent physical therapy for hip or back pain.  Pain Inventory Average Pain 8 Pain Right Now 6 My pain is sharp, burning, stabbing and aching  In the last 24 hours, has pain interfered with the following? General activity 7 Relation with others 5 Enjoyment of life 8 What TIME of day is your pain at its worst? all Sleep (in general) Poor  Pain is worse with: walking, bending, sitting, inactivity, standing and some activites Pain improves with: n/a Relief from Meds: 1  Mobility walk without assistance how many minutes  can you walk? 5-10 minutes ability to climb steps?  yes do you drive?  yes Do you have any goals in this area?  yes  Function disabled: date disabled 11/2001 I need assistance with the following:  meal prep, household duties and shopping  Neuro/Psych bladder control problems numbness tingling trouble walking  Prior Studies Any changes since last visit?  no  Physicians involved in your care Any changes since last visit?  no   No family history on file. Social History   Social History  . Marital status: Widowed    Spouse name: N/A  . Number of children: N/A  . Years of education: N/A   Social History Main Topics  . Smoking status: Current Every Day Smoker    Packs/day: 0.50    Types: Cigarettes  . Smokeless tobacco: Never Used  . Alcohol use No  . Drug use: No  . Sexual activity: Not Asked   Other Topics Concern  . None   Social History Narrative  . None   Past Surgical History:  Procedure Laterality Date  . ABDOMINAL SURGERY    . APPENDECTOMY    . CHOLECYSTECTOMY    . COLONOSCOPY N/A 02/01/2014   2 SIMPLE ADNEOMA(HF), HYEPRPLASTIC RECTAL POLYPS  . DILATION AND CURETTAGE OF UTERUS    . NECK SURGERY    . PARTIAL HYSTERECTOMY     Past Medical History:  Diagnosis Date  . Degenerative disk disease   . GERD (gastroesophageal reflux disease)   . Hypertension  BP 126/86   Pulse 88   Resp 14   SpO2 98%   Opioid Risk Score:   Fall Risk Score:  `1  Depression screen PHQ 2/9  Depression screen PHQ 2/9 11/07/2015  Decreased Interest 2  Down, Depressed, Hopeless 1  PHQ - 2 Score 3  Altered sleeping 3  Tired, decreased energy 2  Change in appetite 2  Feeling bad or failure about yourself  0  Trouble concentrating 1  Moving slowly or fidgety/restless 2  Suicidal thoughts 0  PHQ-9 Score 13   Review of Systems  Constitutional: Positive for unexpected weight change.       Night sweats  Gastrointestinal: Positive for nausea.  All other systems  reviewed and are negative.      Objective:   Physical Exam  Constitutional: She is oriented to person, place, and time. She appears well-developed and well-nourished.  HENT:  Head: Normocephalic and atraumatic.  Eyes: Conjunctivae and EOM are normal. Pupils are equal, round, and reactive to light.  Neck: Normal range of motion.  Cardiovascular: Normal rate and regular rhythm.   Pulmonary/Chest: Effort normal and breath sounds normal.  Abdominal: Soft. Bowel sounds are normal. She exhibits no distension. There is no tenderness.  Musculoskeletal:       Right hip: Normal.       Left hip: She exhibits decreased range of motion. She exhibits no tenderness and no bony tenderness.       Lumbar back: She exhibits decreased range of motion. She exhibits no tenderness and no deformity.  Neurological: She is alert and oriented to person, place, and time.  Psychiatric: She has a normal mood and affect.  Nursing note and vitals reviewed.  Motor strength is 5/5 bilateral deltoid, biceps, triceps, grip, hip flexion, knee extension, ankle dorsiflexion, plantar flexion. Sensation intact to bilateral C5, C6-C7, C8, L3, L4, L5 and S1 dermatomal distributions       Assessment & Plan:  1. Left hip pain. Exam today, he appears to be mainly intra-articular pain. Has had beneficial results with fluoroscopy guided intra-articular injection last performed properly one year ago. Would recommend repeat injection. We'll schedule for 2 weeks 2. Lumbar pain has both degenerative disc at L5-S1 and also lower lumbar multilevel facet arthrosis. At this time does not appear to have any radicular discomfort. Consider lumbar medial branch blocks

## 2015-11-13 LAB — TOXASSURE SELECT,+ANTIDEPR,UR

## 2015-11-13 LAB — PLEASE NOTE

## 2015-11-14 NOTE — Progress Notes (Signed)
Urine drug screen for this encounter is consistent for prescribed medication 

## 2015-11-18 ENCOUNTER — Ambulatory Visit (HOSPITAL_BASED_OUTPATIENT_CLINIC_OR_DEPARTMENT_OTHER): Payer: Commercial Managed Care - HMO | Admitting: Physical Medicine & Rehabilitation

## 2015-11-18 DIAGNOSIS — M47816 Spondylosis without myelopathy or radiculopathy, lumbar region: Secondary | ICD-10-CM | POA: Diagnosis not present

## 2015-11-18 DIAGNOSIS — G8929 Other chronic pain: Secondary | ICD-10-CM | POA: Diagnosis not present

## 2015-11-18 DIAGNOSIS — Z90711 Acquired absence of uterus with remaining cervical stump: Secondary | ICD-10-CM | POA: Diagnosis not present

## 2015-11-18 DIAGNOSIS — F17210 Nicotine dependence, cigarettes, uncomplicated: Secondary | ICD-10-CM | POA: Diagnosis not present

## 2015-11-18 DIAGNOSIS — M25552 Pain in left hip: Secondary | ICD-10-CM | POA: Diagnosis not present

## 2015-11-18 DIAGNOSIS — Z79891 Long term (current) use of opiate analgesic: Secondary | ICD-10-CM | POA: Diagnosis not present

## 2015-11-18 DIAGNOSIS — M1612 Unilateral primary osteoarthritis, left hip: Secondary | ICD-10-CM | POA: Insufficient documentation

## 2015-11-18 DIAGNOSIS — I10 Essential (primary) hypertension: Secondary | ICD-10-CM | POA: Diagnosis not present

## 2015-11-18 DIAGNOSIS — Z9049 Acquired absence of other specified parts of digestive tract: Secondary | ICD-10-CM | POA: Diagnosis not present

## 2015-11-18 DIAGNOSIS — K219 Gastro-esophageal reflux disease without esophagitis: Secondary | ICD-10-CM | POA: Diagnosis not present

## 2015-11-18 MED ORDER — TRAMADOL HCL 50 MG PO TABS: 50.0000 mg | ORAL_TABLET | Freq: Three times a day (TID) | ORAL | 1 refills | Status: DC | PRN

## 2015-11-18 NOTE — Progress Notes (Signed)
  PROCEDURE RECORD [facility]   Name: EDID FUSTER DOB:06/01/1965 MRN: [id number]  Date:11/18/2015  Physician: Alysia Penna, MD    Nurse/CMA: Wessling, CMA  Allergies:  Allergies  Allergen Reactions  . Augmentin [Amoxicillin-Pot Clavulanate] Nausea And Vomiting  . Shellfish Allergy Hives and Swelling  . Ibuprofen Hives and Swelling  . Unasyn [Ampicillin-Sulbactam Sodium] Hives and Swelling    Consent Signed: Yes.    Is patient diabetic? No.  CBG today?   Pregnant: No. LMP: No LMP recorded. Patient has had a hysterectomy. (age 33-55)  Anticoagulants: no Anti-inflammatory: no Antibiotics: no  Procedure:  Left hip intra-articular fluoro guided hip injection Position: Supine Start Time:  9:54am     End Time:   10:00 am      Fluoro Time: 29  RN/CMA Yolanda Bonine, CMA Wessling, CMA    Time 9:28 am 10:00am    BP 136/91 140/85    Pulse 74 65    Respirations 14 14    O2 Sat 94 95    S/S 6 6    Pain Level 6/10 0/10     D/C home with Kendrick Fries, patient A & O X 3, D/C instructions reviewed, and sits independently.

## 2015-11-18 NOTE — Progress Notes (Signed)
hip intra-articular injection under fluoro guidance  Indication osteoarthritis unresponsive to conservative care including exercise and oral medications  Informed consent was obtained after describing risks and benefits of the procedure, this includes bleeding bruising and infection. The patient elected to proceed and has given written consent Patient placed supine on exam table. 4 Hz curvilinear transducer utilized. Femur was identified distally and followed proximally to the femoral neck. Area was marked and prepped with Betadine. 25-gauge 1.5 inch needle was utilized to anesthetize the skin and subcutaneous tissue with 3 cc of 1% lidocaine. Then a 22-gauge3.5 needle was inserted under fluoro, targeting the junction of the femoral head and femoral neck. Bone contact was made. Then a solution containing 1.5 mL of 6 mg per mL/tone and 3.5 ML of 1% lidocaine were injected after negative drawback for blood. Patient tolerated procedure well. Post procedure instructions given.

## 2015-11-18 NOTE — Patient Instructions (Signed)
Received hip injection with Celestone and lidocaine. Start working in one to 2 days.  Back injection next visit

## 2015-12-12 ENCOUNTER — Telehealth: Payer: Self-pay

## 2015-12-12 DIAGNOSIS — Z23 Encounter for immunization: Secondary | ICD-10-CM | POA: Diagnosis not present

## 2015-12-12 DIAGNOSIS — Z6826 Body mass index (BMI) 26.0-26.9, adult: Secondary | ICD-10-CM | POA: Diagnosis not present

## 2015-12-12 DIAGNOSIS — J069 Acute upper respiratory infection, unspecified: Secondary | ICD-10-CM | POA: Diagnosis not present

## 2015-12-12 DIAGNOSIS — J0100 Acute maxillary sinusitis, unspecified: Secondary | ICD-10-CM | POA: Diagnosis not present

## 2015-12-12 NOTE — Telephone Encounter (Signed)
Patient called, stated was prescribed levoquin on Tuesday, is scheduled for a procedure next week for a MBB, wanted to know if it is ok to continue procedure.

## 2015-12-12 NOTE — Telephone Encounter (Signed)
Should be ok by then

## 2015-12-15 NOTE — Telephone Encounter (Signed)
Appointment date has been moved to 01/15/16

## 2015-12-15 NOTE — Telephone Encounter (Signed)
Called patient to inform her of the doctors advise, no answer, Left message for patient to call us back

## 2015-12-16 ENCOUNTER — Ambulatory Visit: Payer: Commercial Managed Care - HMO | Admitting: Physical Medicine & Rehabilitation

## 2016-01-06 ENCOUNTER — Other Ambulatory Visit (HOSPITAL_COMMUNITY): Payer: Self-pay | Admitting: Family Medicine

## 2016-01-06 DIAGNOSIS — Z1231 Encounter for screening mammogram for malignant neoplasm of breast: Secondary | ICD-10-CM

## 2016-01-08 ENCOUNTER — Ambulatory Visit (HOSPITAL_COMMUNITY)
Admission: RE | Admit: 2016-01-08 | Discharge: 2016-01-08 | Disposition: A | Discharge status: Home / Self Care | Payer: Medicare HMO | Source: Ambulatory Visit | Attending: Family Medicine | Admitting: Family Medicine

## 2016-01-08 ENCOUNTER — Other Ambulatory Visit (HOSPITAL_COMMUNITY): Payer: Self-pay | Admitting: Family Medicine

## 2016-01-08 ENCOUNTER — Ambulatory Visit (HOSPITAL_COMMUNITY): Payer: Commercial Managed Care - HMO

## 2016-01-08 DIAGNOSIS — Z1231 Encounter for screening mammogram for malignant neoplasm of breast: Secondary | ICD-10-CM | POA: Insufficient documentation

## 2016-01-14 DIAGNOSIS — I10 Essential (primary) hypertension: Secondary | ICD-10-CM | POA: Diagnosis not present

## 2016-01-14 DIAGNOSIS — Z6826 Body mass index (BMI) 26.0-26.9, adult: Secondary | ICD-10-CM | POA: Diagnosis not present

## 2016-01-14 DIAGNOSIS — Z01419 Encounter for gynecological examination (general) (routine) without abnormal findings: Secondary | ICD-10-CM | POA: Diagnosis not present

## 2016-01-15 ENCOUNTER — Ambulatory Visit (HOSPITAL_BASED_OUTPATIENT_CLINIC_OR_DEPARTMENT_OTHER): Payer: Medicare HMO | Admitting: Physical Medicine & Rehabilitation

## 2016-01-15 ENCOUNTER — Encounter: Payer: Medicare HMO | Attending: Physical Medicine & Rehabilitation

## 2016-01-15 ENCOUNTER — Encounter: Payer: Self-pay | Admitting: Physical Medicine & Rehabilitation

## 2016-01-15 VITALS — BP 120/85 | HR 84 | Resp 14

## 2016-01-15 DIAGNOSIS — G8929 Other chronic pain: Secondary | ICD-10-CM | POA: Insufficient documentation

## 2016-01-15 DIAGNOSIS — M47816 Spondylosis without myelopathy or radiculopathy, lumbar region: Secondary | ICD-10-CM | POA: Insufficient documentation

## 2016-01-15 DIAGNOSIS — F17210 Nicotine dependence, cigarettes, uncomplicated: Secondary | ICD-10-CM | POA: Diagnosis not present

## 2016-01-15 DIAGNOSIS — Z79891 Long term (current) use of opiate analgesic: Secondary | ICD-10-CM | POA: Insufficient documentation

## 2016-01-15 DIAGNOSIS — Z90711 Acquired absence of uterus with remaining cervical stump: Secondary | ICD-10-CM | POA: Diagnosis not present

## 2016-01-15 DIAGNOSIS — M25552 Pain in left hip: Secondary | ICD-10-CM | POA: Insufficient documentation

## 2016-01-15 DIAGNOSIS — K219 Gastro-esophageal reflux disease without esophagitis: Secondary | ICD-10-CM | POA: Diagnosis not present

## 2016-01-15 DIAGNOSIS — I10 Essential (primary) hypertension: Secondary | ICD-10-CM | POA: Diagnosis not present

## 2016-01-15 DIAGNOSIS — Z9049 Acquired absence of other specified parts of digestive tract: Secondary | ICD-10-CM | POA: Insufficient documentation

## 2016-01-15 MED ORDER — TRAMADOL HCL 50 MG PO TABS: 50.0000 mg | ORAL_TABLET | Freq: Three times a day (TID) | ORAL | 1 refills | Status: DC | PRN

## 2016-01-15 NOTE — Progress Notes (Signed)
  PROCEDURE RECORD [facility]   Name: [name] DOB:[birth year] MRN: [id number]  Date:01/15/2016  Physician: Alysia Penna, MD    Nurse/CMA: Phuong Hillary, CMA  Allergies:  Allergies  Allergen Reactions  . Augmentin [Amoxicillin-Pot Clavulanate] Nausea And Vomiting  . Shellfish Allergy Hives and Swelling  . Ibuprofen Hives and Swelling  . Unasyn [Ampicillin-Sulbactam Sodium] Hives and Swelling    Consent Signed: Yes.    Is patient diabetic? No.  CBG today? n/a  Pregnant: No. LMP: No LMP recorded. Patient has had a hysterectomy. (age 3-55)  Anticoagulants: no Anti-inflammatory: no Antibiotics: no  Procedure: bilateral L3,4,5 medial branch block  Position: Prone Start Time: 9:50am  End Time: 10:00am  Fluoro Time: 42  RN/CMA Shir Bergman, CMA Chrishawn Kring, CMA    Time 9:25am 10:05am    BP 120/85 130/87    Pulse 84 78    Respirations 14 14    O2 Sat 96 97    S/S 6 6    Pain Level 9/10 5/10     D/C home with sister, patient A & O X 3, D/C instructions reviewed, and sits independently.

## 2016-01-15 NOTE — Patient Instructions (Signed)

## 2016-01-15 NOTE — Progress Notes (Signed)
Bilateral Lumbar L3, L4  medial branch blocks and L 5 dorsal ramus injection under fluoroscopic guidance  Indication: Lumbar pain which is not relieved by medication management or other conservative care and interfering with self-care and mobility.  Informed consent was obtained after describing risks and benefits of the procedure with the patient, this includes bleeding, infection, paralysis and medication side effects.  The patient wishes to proceed and has given written consent.  The patient was placed in prone position.  The lumbar area was marked and prepped with Betadine.  One mL of 1% lidocaine was injected into each of 6 areas into the skin and subcutaneous tissue.  Then a 22-gauge 3.5in spinal needle was inserted targeting the junction of the left S1 superior articular process and sacral ala junction. Needle was advanced under fluoroscopic guidance.  Bone contact was made.  Omnipaque 180 was injected x 0.5 mL demonstrating no intravascular uptake.  Then a solution containing one mL of 4 mg per mL dexamethasone and 3 mL of 2% MPF lidocaine was injected x 0.5 mL.  Then the left L5 superior articular process in transverse process junction was targeted.  Bone contact was made.  Omnipaque 180 was injected x 0.5 mL demonstrating no intravascular uptake. Then a solution containing one mL of 4 mg per mL dexamethasone and 3 mL of 2% MPF lidocaine was injected x 0.5 mL.  Then the left L4 superior articular process in transverse process junction was targeted.  Bone contact was made.  Omnipaque 180 was injected x 0.5 mL demonstrating no intravascular uptake.  Then a solution containing one mL of 4 mg per mL dexamethasone and 3 mL if 2% MPF lidocaine was injected x 0.5 mL.  This same procedure was performed on the right side using the same needle, technique and injectate.  Patient tolerated procedure well.  Post procedure instructions were given.

## 2016-02-11 DIAGNOSIS — H524 Presbyopia: Secondary | ICD-10-CM | POA: Diagnosis not present

## 2016-02-12 ENCOUNTER — Ambulatory Visit (HOSPITAL_BASED_OUTPATIENT_CLINIC_OR_DEPARTMENT_OTHER): Payer: Medicare HMO | Admitting: Physical Medicine & Rehabilitation

## 2016-02-12 ENCOUNTER — Encounter: Payer: Medicare HMO | Attending: Physical Medicine & Rehabilitation

## 2016-02-12 DIAGNOSIS — Z79891 Long term (current) use of opiate analgesic: Secondary | ICD-10-CM | POA: Insufficient documentation

## 2016-02-12 DIAGNOSIS — M25552 Pain in left hip: Secondary | ICD-10-CM | POA: Insufficient documentation

## 2016-02-12 DIAGNOSIS — M47816 Spondylosis without myelopathy or radiculopathy, lumbar region: Secondary | ICD-10-CM | POA: Diagnosis not present

## 2016-02-12 DIAGNOSIS — F17210 Nicotine dependence, cigarettes, uncomplicated: Secondary | ICD-10-CM | POA: Diagnosis not present

## 2016-02-12 DIAGNOSIS — I10 Essential (primary) hypertension: Secondary | ICD-10-CM | POA: Diagnosis not present

## 2016-02-12 DIAGNOSIS — K219 Gastro-esophageal reflux disease without esophagitis: Secondary | ICD-10-CM | POA: Diagnosis not present

## 2016-02-12 DIAGNOSIS — Z9049 Acquired absence of other specified parts of digestive tract: Secondary | ICD-10-CM | POA: Insufficient documentation

## 2016-02-12 DIAGNOSIS — Z90711 Acquired absence of uterus with remaining cervical stump: Secondary | ICD-10-CM | POA: Diagnosis not present

## 2016-02-12 DIAGNOSIS — G8929 Other chronic pain: Secondary | ICD-10-CM | POA: Insufficient documentation

## 2016-02-12 NOTE — Patient Instructions (Signed)

## 2016-02-12 NOTE — Progress Notes (Signed)
Bilateral Lumbar L3, L4  medial branch blocks and L 5 dorsal ramus injection under fluoroscopic guidance  Indication: Lumbar pain which is not relieved by medication management or other conservative care and interfering with self-care and mobility.  Informed consent was obtained after describing risks and benefits of the procedure with the patient, this includes bleeding, infection, paralysis and medication side effects.  The patient wishes to proceed and has given written consent.  The patient was placed in prone position.  The lumbar area was marked and prepped with Betadine.  One mL of 1% lidocaine was injected into each of 6 areas into the skin and subcutaneous tissue.  Then a 22-gauge 3.5inch spinal needle was inserted targeting the junction of the left S1 superior articular process and sacral ala junction. Needle was advanced under fluoroscopic guidance.  Bone contact was made.  Isovue 200 was injected x 0.5 mL demonstrating no intravascular uptake.  Then a solution  of 2% MPF lidocaine was injected x 0.5 mL.  Then the left L5 superior articular process in transverse process junction was targeted.  Bone contact was made.  Isovue 200 was injected x 0.5 mL demonstrating no intravascular uptake. Then a solution containing  2% MPF lidocaine was injected x 0.5 mL.  Then the left L4 superior articular process in transverse process junction was targeted.  Bone contact was made.  Isovue 200 was injected x 0.5 mL demonstrating no intravascular uptake.  Then a solution containing2% MPF lidocaine was injected x 0.5 mL.  This same procedure was performed on the right side using the same needle, technique and injectate.  Patient tolerated procedure well.  Post procedure instructions were given.  Prior set of medial branch blocks performed 1 month ago improved low back pain by 50%, duration approximately 2 weeks If similar response, would be good candidate for radiofrequency, we'll discuss at next month's visit.

## 2016-02-12 NOTE — Progress Notes (Signed)
  PROCEDURE RECORD [facility]   Name: [name] DOB:10-18-1965 MRN: [id number]  Date:02/12/2016  Physician: Alysia Penna, MD    Nurse/CMA: Yolanda Bonine  Allergies:  Allergies  Allergen Reactions  . Augmentin [Amoxicillin-Pot Clavulanate] Nausea And Vomiting  . Shellfish Allergy Hives and Swelling  . Ibuprofen Hives and Swelling  . Unasyn [Ampicillin-Sulbactam Sodium] Hives and Swelling    Consent Signed: Yes.    Is patient diabetic? No.  CBG today?   Pregnant: No. LMP: No LMP recorded. Patient has had a hysterectomy. (age 42-55)  Anticoagulants: no Anti-inflammatory: no Antibiotics: no  Procedure: Bilateral Medial Branch Block Position: Prone Start Time: 1005 End Time:1016  Fluoro Time: 32  RN/CMA Janet Berlin    Time 0953 1019    BP 114/81 125/85    Pulse 89 83    Respirations 14 14    O2 Sat 94 98    S/S 6 6    Pain Level 6 2     D/C home with [name], patient A & O X 3, D/C instructions reviewed, and sits independently.

## 2016-03-11 ENCOUNTER — Ambulatory Visit (HOSPITAL_BASED_OUTPATIENT_CLINIC_OR_DEPARTMENT_OTHER): Payer: Medicare HMO | Admitting: Physical Medicine & Rehabilitation

## 2016-03-11 ENCOUNTER — Encounter: Payer: Self-pay | Admitting: Physical Medicine & Rehabilitation

## 2016-03-11 ENCOUNTER — Encounter: Payer: Medicare HMO | Attending: Physical Medicine & Rehabilitation

## 2016-03-11 VITALS — BP 129/87 | HR 77

## 2016-03-11 DIAGNOSIS — F17210 Nicotine dependence, cigarettes, uncomplicated: Secondary | ICD-10-CM | POA: Insufficient documentation

## 2016-03-11 DIAGNOSIS — M791 Myalgia: Secondary | ICD-10-CM

## 2016-03-11 DIAGNOSIS — I10 Essential (primary) hypertension: Secondary | ICD-10-CM | POA: Diagnosis not present

## 2016-03-11 DIAGNOSIS — Z9049 Acquired absence of other specified parts of digestive tract: Secondary | ICD-10-CM | POA: Insufficient documentation

## 2016-03-11 DIAGNOSIS — K219 Gastro-esophageal reflux disease without esophagitis: Secondary | ICD-10-CM | POA: Diagnosis not present

## 2016-03-11 DIAGNOSIS — M47816 Spondylosis without myelopathy or radiculopathy, lumbar region: Secondary | ICD-10-CM | POA: Insufficient documentation

## 2016-03-11 DIAGNOSIS — M25552 Pain in left hip: Secondary | ICD-10-CM | POA: Insufficient documentation

## 2016-03-11 DIAGNOSIS — G8929 Other chronic pain: Secondary | ICD-10-CM | POA: Insufficient documentation

## 2016-03-11 DIAGNOSIS — Z90711 Acquired absence of uterus with remaining cervical stump: Secondary | ICD-10-CM | POA: Diagnosis not present

## 2016-03-11 DIAGNOSIS — M7918 Myalgia, other site: Secondary | ICD-10-CM

## 2016-03-11 DIAGNOSIS — M961 Postlaminectomy syndrome, not elsewhere classified: Secondary | ICD-10-CM | POA: Diagnosis not present

## 2016-03-11 DIAGNOSIS — Z79891 Long term (current) use of opiate analgesic: Secondary | ICD-10-CM | POA: Insufficient documentation

## 2016-03-11 NOTE — Progress Notes (Signed)
Subjective:    Patient ID: [name], female    DOB: 1965-04-02, 51 y.o.   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 General activity 3 Relation with others 3 Enjoyment of life 3 What TIME of day is your pain at its worst? all Sleep (in general) Fair  Pain is worse with: walking, bending, sitting, standing and some activites Pain improves with: . Relief from Meds: .  Mobility walk without assistance ability to climb steps?  yes do you drive?  yes  Function disabled: date disabled 2008 I need assistance with the following:  household duties and shopping  Neuro/Psych No problems in this area  Prior Studies Any changes since last visit?  no  Physicians involved in your care Any changes since last visit?  no   No family history on file. Social History   Social History  . Marital status: Widowed    Spouse name: N/A  . Number of children: N/A  . Years of education: N/A   Social History Main Topics  . Smoking status: Current Every Day Smoker    Packs/day: 0.50    Types: Cigarettes  . Smokeless tobacco: Never Used  . Alcohol use No  . Drug use: No  . Sexual activity: Not on file   Other Topics Concern  . Not on file   Social History Narrative  . No narrative on file   Past Surgical History:  Procedure Laterality Date  . ABDOMINAL SURGERY    . APPENDECTOMY    . CHOLECYSTECTOMY    . COLONOSCOPY N/A 02/01/2014   2 SIMPLE ADNEOMA(HF), HYEPRPLASTIC RECTAL POLYPS  . DILATION AND CURETTAGE  OF UTERUS    . NECK SURGERY    . PARTIAL HYSTERECTOMY     Past Medical History:  Diagnosis Date  . Degenerative disk disease   . GERD (gastroesophageal reflux disease)   . Hypertension    There were no vitals taken for this visit.  Opioid Risk Score:   Fall Risk Score:  `1  Depression screen PHQ 2/9  Depression screen PHQ 2/9 11/07/2015  Decreased Interest 2  Down, Depressed, Hopeless 1  PHQ - 2 Score 3  Altered sleeping 3  Tired, decreased energy 2  Change in appetite 2  Feeling bad or failure about yourself  0  Trouble concentrating 1  Moving slowly or fidgety/restless 2  Suicidal thoughts 0  PHQ-9 Score 13    Review of Systems  Constitutional: Negative.   HENT: Negative.   Eyes: Negative.   Respiratory: Negative.   Cardiovascular: Negative.   Gastrointestinal: Negative.   Endocrine: Negative.   Genitourinary: Negative.   Musculoskeletal: Positive for neck pain.  Skin: Negative.   Allergic/Immunologic: Negative.   Neurological: Negative.   Hematological: Negative.   Psychiatric/Behavioral: Negative.   All other systems reviewed and are negative.      Objective:   Physical Exam  Constitutional: She is oriented to person, place, and time. She appears well-developed and well-nourished.  HENT:  Head: Normocephalic and atraumatic.  Neurological: She is alert and oriented to person, place, and time. She exhibits normal muscle tone. Coordination normal.  Psychiatric: She has a normal mood and affect. Her behavior is normal. Judgment and thought content normal.  Nursing note and vitals reviewed. Pain to palpation over the left trapezius muscle. No pain with palpation over the lateral masses or over the spinous process. Neck range of motion painfree flex/ext / lateral rotation and bending  Motor 5/5 in BUE and BLE Hand without atrophy       Assessment & Plan:  1.  Cervical post laminectomy syndrome-Tramadol 50mg  TID Patient determined that it was a new  detergent that made her itch  2.  Cervical myofascial pain exacerbation with prolonged computer use  Trigger Point Injection  Indication: Left trapezius Myofascial pain not relieved by medication management and other conservative care.  Informed consent was obtained after describing risk and benefits of the procedure with the patient, this includes bleeding, bruising, infection and medication side effects.  The patient wishes to proceed and has given written consent.  The patient was placed in a seated position.  The Left trapezius area was marked and prepped with Betadine.  It was entered with a 25-gauge 1-1/2 inch needle and 1 mL of 1% lidocaine was injected into each of 3 trigger points, after negative draw back for blood.  The patient tolerated the procedure well.  Post procedure instructions were given.

## 2016-03-11 NOTE — Patient Instructions (Signed)
Trigger Point Injection Trigger points are areas where you have pain. A trigger point injection is a shot given in the trigger point to help relieve pain for a few days to a few months. Common places for trigger points include:  The neck.  The shoulders.  The upper back.  The lower back. A trigger point injection will not cure long-lasting (chronic) pain permanently. These injections do not always work for every person, but for some people they can help to relieve pain for a few days to a few months. Tell a health care provider about:  Any allergies you have.  All medicines you are taking, including vitamins, herbs, eye drops, creams, and over-the-counter medicines.  Any problems you or family members have had with anesthetic medicines.  Any blood disorders you have.  Any surgeries you have had.  Any medical conditions you have. What are the risks? Generally, this is a safe procedure. However, problems may occur, including:  Infection.  Bleeding.  Allergic reaction to the injected medicine.  Irritation of the skin around the injection site. What happens before the procedure?  Ask your health care provider about changing or stopping your regular medicines. This is especially important if you are taking diabetes medicines or blood thinners. What happens during the procedure?  Your health care provider will feel for trigger points. A marker may be used to circle the area for the injection.  The skin over the trigger point will be washed with a germ-killing (antiseptic) solution.  A thin needle is used for the shot. You may feel pain or a twitching feeling when the needle enters the trigger point.  A numbing solution may be injected into the trigger point. Sometimes a medicine to keep down swelling, redness, and warmth (inflammation) is also injected.  Your health care provider may move the needle around the area where the trigger point is located until the tightness and  twitching goes away.  After the injection, your health care provider may put gentle pressure over the injection site.  The injection site will be covered with a bandage (dressing). The procedure may vary among health care providers and hospitals. What happens after the procedure?  The dressing can be taken off in a few hours or as told by your health care provider.  You may feel sore and stiff for 1-2 days. This information is not intended to replace advice given to you by your health care provider. Make sure you discuss any questions you have with your health care provider. Document Released: 12/10/2010 Document Revised: 08/24/2015 Document Reviewed: 06/10/2014 Elsevier Interactive Patient Education  2017 [employer].

## 2016-04-22 ENCOUNTER — Ambulatory Visit: Payer: Medicare HMO | Admitting: Physical Medicine & Rehabilitation

## 2016-04-26 ENCOUNTER — Ambulatory Visit (HOSPITAL_BASED_OUTPATIENT_CLINIC_OR_DEPARTMENT_OTHER): Payer: Medicare HMO | Admitting: Physical Medicine & Rehabilitation

## 2016-04-26 ENCOUNTER — Encounter: Payer: Medicare HMO | Attending: Physical Medicine & Rehabilitation

## 2016-04-26 ENCOUNTER — Encounter: Payer: Self-pay | Admitting: Physical Medicine & Rehabilitation

## 2016-04-26 VITALS — BP 130/88 | HR 79

## 2016-04-26 DIAGNOSIS — M47816 Spondylosis without myelopathy or radiculopathy, lumbar region: Secondary | ICD-10-CM

## 2016-04-26 DIAGNOSIS — I10 Essential (primary) hypertension: Secondary | ICD-10-CM | POA: Diagnosis not present

## 2016-04-26 DIAGNOSIS — M1612 Unilateral primary osteoarthritis, left hip: Secondary | ICD-10-CM | POA: Diagnosis not present

## 2016-04-26 DIAGNOSIS — Z9049 Acquired absence of other specified parts of digestive tract: Secondary | ICD-10-CM | POA: Diagnosis not present

## 2016-04-26 DIAGNOSIS — M7918 Myalgia, other site: Secondary | ICD-10-CM

## 2016-04-26 DIAGNOSIS — M791 Myalgia: Secondary | ICD-10-CM

## 2016-04-26 DIAGNOSIS — Z90711 Acquired absence of uterus with remaining cervical stump: Secondary | ICD-10-CM | POA: Insufficient documentation

## 2016-04-26 DIAGNOSIS — M25552 Pain in left hip: Secondary | ICD-10-CM | POA: Diagnosis not present

## 2016-04-26 DIAGNOSIS — Z79891 Long term (current) use of opiate analgesic: Secondary | ICD-10-CM | POA: Insufficient documentation

## 2016-04-26 DIAGNOSIS — G8929 Other chronic pain: Secondary | ICD-10-CM | POA: Insufficient documentation

## 2016-04-26 DIAGNOSIS — K219 Gastro-esophageal reflux disease without esophagitis: Secondary | ICD-10-CM | POA: Insufficient documentation

## 2016-04-26 DIAGNOSIS — F17210 Nicotine dependence, cigarettes, uncomplicated: Secondary | ICD-10-CM | POA: Diagnosis not present

## 2016-04-26 MED ORDER — TRAMADOL HCL 50 MG PO TABS: 50.0000 mg | ORAL_TABLET | Freq: Two times a day (BID) | ORAL | 2 refills | Status: DC

## 2016-04-26 NOTE — Progress Notes (Signed)
Subjective:    Patient ID: [name], female    DOB: 1965-02-08, 51 y.o.   MRN: [id number]  HPI  Has migraine HA , [facility] Baclofen 10mg  some days takes 4 tabs per week max  3/8 Left trap trigger point helpful- no neck pain 2/8 and 01/15/16 Bilateral L3-4-5 MBB no back pain  11/18/2015  Left hip intra articular fluoro guided  Riding in car  NSAIDs cause rash  Trying to get into exercising in gym, has not done so in a year  Pain Inventory Average Pain 0 Pain Right Now 0 My pain is na  In the last 24 hours, has pain interfered with the following? General activity 0 Relation with others 0 Enjoyment of life 0 What TIME of day is your pain at its worst? na Sleep (in general) Good  Pain is worse with: na Pain improves with: na Relief from Meds: na  Mobility walk without assistance ability to climb steps?  yes do you drive?  yes  Function disabled: date disabled 2003  Neuro/Psych No problems in this area  Prior Studies Any changes since last visit?  no  Physicians involved in your care Any changes since last visit?  no   No family history on file. Social History   Social History  . Marital status: Widowed    Spouse name: N/A  . Number of children: N/A  . Years of education: N/A   Social History Main Topics  . Smoking status: Current Every Day Smoker    Packs/day: 0.50    Types: Cigarettes  . Smokeless tobacco: Never Used  . Alcohol use No  . Drug use: No  . Sexual activity: Not Asked   Other Topics Concern  . None   Social History Narrative  . None   Past Surgical History:  Procedure Laterality Date  . ABDOMINAL SURGERY    . APPENDECTOMY    . CHOLECYSTECTOMY    . COLONOSCOPY N/A 02/01/2014   2 SIMPLE ADNEOMA(HF), HYEPRPLASTIC RECTAL POLYPS  . DILATION AND CURETTAGE OF UTERUS    . NECK SURGERY    . PARTIAL HYSTERECTOMY     Past Medical History:  Diagnosis Date  . Degenerative disk disease   . GERD  (gastroesophageal reflux disease)   . Hypertension    BP 130/88   Pulse 79   SpO2 98%   Opioid Risk Score:   Fall Risk Score:  `1  Depression screen PHQ 2/9  Depression screen PHQ 2/9 11/07/2015  Decreased Interest 2  Down, Depressed, Hopeless 1  PHQ - 2 Score 3  Altered sleeping 3  Tired, decreased energy 2  Change in appetite 2  Feeling bad or failure about yourself  0  Trouble concentrating 1  Moving slowly or fidgety/restless 2  Suicidal thoughts 0  PHQ-9 Score 13    Review of Systems  Constitutional: Negative.   HENT: Negative.   Eyes: Negative.   Respiratory: Negative.   Cardiovascular: Negative.   Gastrointestinal: Negative.   Endocrine: Negative.   Genitourinary: Negative.   Musculoskeletal: Negative.   Skin: Negative.   Allergic/Immunologic: Negative.   Neurological: Negative.   Hematological: Negative.   Psychiatric/Behavioral: Negative.   All other systems reviewed and are negative.      Objective:   Physical Exam No tenderness over the trapezius, no tenderness over the lumbar paraspinals. Lumbar range of motion is full. Left hip internal/external rotation is mildly diminished. Right hip internal/external rotation is normal. Motor strength  is 5/5 bilateral hip flexor, knee extensor, ankle dorsiflexor. Motor strength is 5/5 bilateral deltoid, biceps, plus grip. Gait is without evidence of toe drag or knee instability. Mood and affect are appropriate       Assessment & Plan:  1. Cervical post laminotomy syndrome as well as chronic myofascial pain. We will continue tramadol 50 mg twice a day  2. Lumbar spondylosis without myelopathy. Continued results after medial branch blocks, get her back into community-based excise program, printed out exercises, avoid hyperextension of the lumbar spine  3. Left hip osteoarthritis, status post intra-articular injection November 2017. Still having good results, have given her hip stretching  exercises.  Follow-up 3 months or sooner if needed for injection

## 2016-04-26 NOTE — Patient Instructions (Signed)

## 2016-07-08 ENCOUNTER — Telehealth: Payer: Self-pay | Admitting: Physical Medicine & Rehabilitation

## 2016-07-08 DIAGNOSIS — M1612 Unilateral primary osteoarthritis, left hip: Secondary | ICD-10-CM | POA: Diagnosis not present

## 2016-07-08 DIAGNOSIS — M1991 Primary osteoarthritis, unspecified site: Secondary | ICD-10-CM | POA: Diagnosis not present

## 2016-07-08 DIAGNOSIS — E669 Obesity, unspecified: Secondary | ICD-10-CM | POA: Diagnosis not present

## 2016-07-08 DIAGNOSIS — M545 Low back pain: Secondary | ICD-10-CM | POA: Diagnosis not present

## 2016-07-08 DIAGNOSIS — I10 Essential (primary) hypertension: Secondary | ICD-10-CM | POA: Diagnosis not present

## 2016-07-08 DIAGNOSIS — M5136 Other intervertebral disc degeneration, lumbar region: Secondary | ICD-10-CM | POA: Diagnosis not present

## 2016-07-08 DIAGNOSIS — Z6824 Body mass index (BMI) 24.0-24.9, adult: Secondary | ICD-10-CM | POA: Diagnosis not present

## 2016-07-08 DIAGNOSIS — E782 Mixed hyperlipidemia: Secondary | ICD-10-CM | POA: Diagnosis not present

## 2016-07-08 DIAGNOSIS — Z1389 Encounter for screening for other disorder: Secondary | ICD-10-CM | POA: Diagnosis not present

## 2016-07-08 NOTE — Telephone Encounter (Signed)
SHE CALLED OFFICE HAS INJURED BACK WANTED A STEROID SHOT- ADVISED [place] OUT OF [place] UNTIL NEXT WEEK SHE WILL CONTACT HER PRIMARY CARE

## 2016-07-26 ENCOUNTER — Encounter: Payer: Medicare HMO | Attending: Physical Medicine & Rehabilitation

## 2016-07-26 ENCOUNTER — Encounter: Payer: Self-pay | Admitting: Physical Medicine & Rehabilitation

## 2016-07-26 ENCOUNTER — Ambulatory Visit (HOSPITAL_BASED_OUTPATIENT_CLINIC_OR_DEPARTMENT_OTHER): Payer: Medicare HMO | Admitting: Physical Medicine & Rehabilitation

## 2016-07-26 VITALS — BP 127/86 | HR 71

## 2016-07-26 DIAGNOSIS — M7918 Myalgia, other site: Secondary | ICD-10-CM

## 2016-07-26 DIAGNOSIS — F17210 Nicotine dependence, cigarettes, uncomplicated: Secondary | ICD-10-CM | POA: Insufficient documentation

## 2016-07-26 DIAGNOSIS — I10 Essential (primary) hypertension: Secondary | ICD-10-CM | POA: Diagnosis not present

## 2016-07-26 DIAGNOSIS — M47816 Spondylosis without myelopathy or radiculopathy, lumbar region: Secondary | ICD-10-CM | POA: Insufficient documentation

## 2016-07-26 DIAGNOSIS — M1612 Unilateral primary osteoarthritis, left hip: Secondary | ICD-10-CM | POA: Insufficient documentation

## 2016-07-26 DIAGNOSIS — M791 Myalgia: Secondary | ICD-10-CM

## 2016-07-26 DIAGNOSIS — K219 Gastro-esophageal reflux disease without esophagitis: Secondary | ICD-10-CM | POA: Insufficient documentation

## 2016-07-26 MED ORDER — TRAMADOL HCL 50 MG PO TABS: 50.0000 mg | ORAL_TABLET | Freq: Two times a day (BID) | ORAL | 2 refills | Status: DC

## 2016-07-26 NOTE — Patient Instructions (Signed)
Will need a driver for next visit Lumbar injections

## 2016-07-26 NOTE — Progress Notes (Signed)
Subjective:    Patient ID: [name], female    DOB: 1965-11-29, 51 y.o.   MRN: [id number] 3/8 Left trap trigger point helpful- no neck pain 2/8 and 01/15/16 Bilateral L3-4-5 MBB no back pain  hip intra-articular injection under fluoro guidance  Indication osteoarthritis unresponsive to conservative care including exercise and oral medications  Informed consent was obtained after describing risks and benefits of the procedure, this includes bleeding bruising and infection. The patient elected to proceed and has given written consent Patient placed supine on exam table. 4 Hz curvilinear transducer utilized. Femur was identified distally and followed proximally to the femoral neck. Area was marked and prepped with Betadine. 25-gauge 1.5 inch needle was utilized to anesthetize the skin and subcutaneous tissue with 3 cc of 1% lidocaine. Then a 22-gauge3.5 needle was inserted under fluoro, targeting the junction of the femoral head and femoral neck. Bone contact was made. Then a solution containing 1.5 mL of 6 mg per mL/tone and 3.5 ML of 1% lidocaine were injected after negative drawback for blood. Patient tolerated procedure well. Post procedure instructions given. 11/18/2015  Left hip intra articular fluoro guided HPI Chief complaint is low back pain. Right side is worse than left side. No fall or new injury. No radiating pain down the legs. Neck pain is still doing okay. Left hip pain is starting get more painful as well. Pain Inventory Average Pain 8 Pain Right Now 8 My pain is intermittent, sharp and stabbing  In the last 24 hours, has pain interfered with the following? General activity 8 Relation with others 8 Enjoyment of life 8 What TIME of day is your pain at its worst? all Sleep (in general) Poor  Pain is worse with: walking, bending, sitting, standing and some activites Pain improves with: . Relief from Meds: 0  Mobility walk without assistance ability to climb  steps?  yes do you drive?  yes  Function disabled: date disabled .  Neuro/Psych No problems in this area  Prior Studies Any changes since last visit?  no  Physicians involved in your care Any changes since last visit?  no   No family history on file. Social History   Social History  . Marital status: Widowed    Spouse name: N/A  . Number of children: N/A  . Years of education: N/A   Social History Main Topics  . Smoking status: Current Every Day Smoker    Packs/day: 0.50    Types: Cigarettes  . Smokeless tobacco: Never Used  . Alcohol use No  . Drug use: No  . Sexual activity: Not on file   Other Topics Concern  . Not on file   Social History Narrative  . No narrative on file   Past Surgical History:  Procedure Laterality Date  . ABDOMINAL SURGERY    . APPENDECTOMY    . CHOLECYSTECTOMY    . COLONOSCOPY N/A 02/01/2014   2 SIMPLE ADNEOMA(HF), HYEPRPLASTIC RECTAL POLYPS  . DILATION AND CURETTAGE OF UTERUS    . NECK SURGERY    . PARTIAL HYSTERECTOMY     Past Medical History:  Diagnosis Date  . Degenerative disk disease   . GERD (gastroesophageal reflux disease)   . Hypertension    There were no vitals taken for this visit.  Opioid Risk Score:   Fall Risk Score:  `1  Depression screen PHQ 2/9  Depression screen PHQ 2/9 11/07/2015  Decreased Interest 2  Down, Depressed, Hopeless 1  PHQ - 2 Score  3  Altered sleeping 3  Tired, decreased energy 2  Change in appetite 2  Feeling bad or failure about yourself  0  Trouble concentrating 1  Moving slowly or fidgety/restless 2  Suicidal thoughts 0  PHQ-9 Score 13     Review of Systems  Constitutional: Negative.   HENT: Negative.   Eyes: Negative.   Respiratory: Negative.   Cardiovascular: Negative.   Gastrointestinal: Negative.   Endocrine: Negative.   Genitourinary: Negative.   Musculoskeletal: Negative.   Skin: Negative.   Allergic/Immunologic: Negative.   Neurological: Negative.     Hematological: Negative.   Psychiatric/Behavioral: Negative.   All other systems reviewed and are negative.      Objective:   Physical Exam  Constitutional: She is oriented to person, place, and time. She appears well-developed and well-nourished. No distress.  HENT:  Head: Normocephalic and atraumatic.  Eyes: Pupils are equal, round, and reactive to light. Conjunctivae and EOM are normal. No scleral icterus.  Neck: Normal range of motion. Neck supple. No JVD present.  Pulmonary/Chest: No stridor.  Musculoskeletal:       Lumbar back: She exhibits decreased range of motion and tenderness. She exhibits no deformity.  Pain in right greater than left PSIS area. Pain increases in the low back area with extension. She has good lumbar flexion. Limited extension and lateral bending.   Neurological: She is alert and oriented to person, place, and time.  Skin: She is not diaphoretic.  Psychiatric: She has a normal mood and affect. Her behavior is normal. Judgment and thought content normal.  Nursing note and vitals reviewed.         Assessment & Plan:  1. Lumbar spondylosis without myelopathy. Head good relief with 2 sets of lumbar medial branch blocks up to 5 months. We will repeat bilateral L3, L4, L5 MBB. 2. Left hip osteoarthritis. Will likely need repeat injection starting to wear off after 6 months. 3. Cervical myofascial pain, no recurrence since trigger point injections in March 2018. Continue tramadol 50 g twice a day

## 2016-08-19 ENCOUNTER — Encounter: Payer: Self-pay | Admitting: Physical Medicine & Rehabilitation

## 2016-08-19 ENCOUNTER — Ambulatory Visit (HOSPITAL_BASED_OUTPATIENT_CLINIC_OR_DEPARTMENT_OTHER): Payer: Medicare HMO | Admitting: Physical Medicine & Rehabilitation

## 2016-08-19 ENCOUNTER — Encounter: Payer: Medicare HMO | Attending: Physical Medicine & Rehabilitation

## 2016-08-19 DIAGNOSIS — M1612 Unilateral primary osteoarthritis, left hip: Secondary | ICD-10-CM | POA: Insufficient documentation

## 2016-08-19 DIAGNOSIS — F17210 Nicotine dependence, cigarettes, uncomplicated: Secondary | ICD-10-CM | POA: Insufficient documentation

## 2016-08-19 DIAGNOSIS — M791 Myalgia: Secondary | ICD-10-CM | POA: Diagnosis not present

## 2016-08-19 DIAGNOSIS — K219 Gastro-esophageal reflux disease without esophagitis: Secondary | ICD-10-CM | POA: Diagnosis not present

## 2016-08-19 DIAGNOSIS — I10 Essential (primary) hypertension: Secondary | ICD-10-CM | POA: Insufficient documentation

## 2016-08-19 DIAGNOSIS — M47816 Spondylosis without myelopathy or radiculopathy, lumbar region: Secondary | ICD-10-CM | POA: Insufficient documentation

## 2016-08-19 NOTE — Progress Notes (Signed)
Bilateral Lumbar L3, L4  medial branch blocks and L 5 dorsal ramus injection under fluoroscopic guidance  Indication: Lumbar pain which is not relieved by medication management or other conservative care and interfering with self-care and mobility.  Informed consent was obtained after describing risks and benefits of the procedure with the patient, this includes bleeding, infection, paralysis and medication side effects.  The patient wishes to proceed and has given written consent.  The patient was placed in prone position.  The lumbar area was marked and prepped with Betadine.  One mL of 1% lidocaine was injected into each of 6 areas into the skin and subcutaneous tissue.  Then a 22-gauge 3.5in spinal needle was inserted targeting the junction of the left S1 superior articular process and sacral ala junction. Needle was advanced under fluoroscopic guidance.  Bone contact was made.  Isovue 200 was injected x 0.5 mL demonstrating no intravascular uptake.  Then a solution  of 2% MPF lidocaine was injected x 0.5 mL.  Then the left L5 superior articular process in transverse process junction was targeted.  Bone contact was made.  Isovue 200 was injected x 0.5 mL demonstrating no intravascular uptake. Then a solution containing  2% MPF lidocaine was injected x 0.5 mL.  Then the left L4 superior articular process in transverse process junction was targeted.  Bone contact was made.  Isovue 200 was injected x 0.5 mL demonstrating no intravascular uptake.  Then a solution containing2% MPF lidocaine was injected x 0.5 mL.  This same procedure was performed on the right side using the same needle, technique and injectate.  Patient tolerated procedure well.  Post procedure instructions were given.  

## 2016-08-19 NOTE — Patient Instructions (Signed)

## 2016-08-19 NOTE — Progress Notes (Signed)
  PROCEDURE RECORD [facility]   Name: [name] DOB:[birth year] MRN: [id number]  Date:08/19/2016  Physician: Alysia Penna, MD    Nurse/CMA: Alyssah Algeo, CMA  Allergies:  Allergies  Allergen Reactions  . Augmentin [Amoxicillin-Pot Clavulanate] Nausea And Vomiting  . Shellfish Allergy Hives and Swelling  . Ibuprofen Hives and Swelling  . Unasyn [Ampicillin-Sulbactam Sodium] Hives and Swelling    Consent Signed: Yes.    Is patient diabetic? No.  CBG today? N/A  Pregnant: No. LMP: No LMP recorded. Patient has had a hysterectomy. (age 84-55)  Anticoagulants: no Anti-inflammatory: no Antibiotics: no  Procedure: bilateral medial branch block  Position: Prone Start Time: 9:19 am  End Time: 9:29am        Fluoro Time: 41  RN/CMA Verlene Glantz, CMA Jaleiah Asay, CMA  q  Time 9:00am 9:31am    BP 125/88 129/83    Pulse 72 66    Respirations 14 14    O2 Sat 95 97    S/S 6 6    Pain Level 8/10 1/10     D/C home with sister, patient A & O X 3, D/C instructions reviewed, and sits independently.

## 2016-08-26 ENCOUNTER — Encounter (HOSPITAL_COMMUNITY): Payer: Self-pay | Admitting: Emergency Medicine

## 2016-08-26 ENCOUNTER — Other Ambulatory Visit: Payer: Self-pay

## 2016-08-26 ENCOUNTER — Emergency Department (HOSPITAL_COMMUNITY): Payer: No Typology Code available for payment source

## 2016-08-26 ENCOUNTER — Emergency Department (HOSPITAL_COMMUNITY)
Admission: EM | Admit: 2016-08-26 | Discharge: 2016-08-26 | Disposition: A | Discharge status: Home / Self Care | Payer: No Typology Code available for payment source | Attending: Emergency Medicine | Admitting: Emergency Medicine

## 2016-08-26 DIAGNOSIS — Y9389 Activity, other specified: Secondary | ICD-10-CM | POA: Insufficient documentation

## 2016-08-26 DIAGNOSIS — I10 Essential (primary) hypertension: Secondary | ICD-10-CM | POA: Diagnosis not present

## 2016-08-26 DIAGNOSIS — R0789 Other chest pain: Secondary | ICD-10-CM | POA: Diagnosis not present

## 2016-08-26 DIAGNOSIS — M25512 Pain in left shoulder: Secondary | ICD-10-CM | POA: Insufficient documentation

## 2016-08-26 DIAGNOSIS — Y92410 Unspecified street and highway as the place of occurrence of the external cause: Secondary | ICD-10-CM | POA: Insufficient documentation

## 2016-08-26 DIAGNOSIS — S279XXA Injury of unspecified intrathoracic organ, initial encounter: Secondary | ICD-10-CM | POA: Diagnosis not present

## 2016-08-26 DIAGNOSIS — Y999 Unspecified external cause status: Secondary | ICD-10-CM | POA: Diagnosis not present

## 2016-08-26 DIAGNOSIS — Z79899 Other long term (current) drug therapy: Secondary | ICD-10-CM | POA: Insufficient documentation

## 2016-08-26 DIAGNOSIS — R079 Chest pain, unspecified: Secondary | ICD-10-CM | POA: Diagnosis not present

## 2016-08-26 DIAGNOSIS — S299XXA Unspecified injury of thorax, initial encounter: Secondary | ICD-10-CM | POA: Diagnosis not present

## 2016-08-26 DIAGNOSIS — F17210 Nicotine dependence, cigarettes, uncomplicated: Secondary | ICD-10-CM | POA: Insufficient documentation
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
## 2016-08-26 NOTE — Discharge Instructions (Signed)
X-ray showed no broken bones. You'll be sore for several days. Use your home pain medication.
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
## 2016-09-16 ENCOUNTER — Ambulatory Visit: Payer: Medicare HMO | Admitting: Physical Medicine & Rehabilitation

## 2016-09-20 ENCOUNTER — Ambulatory Visit (HOSPITAL_BASED_OUTPATIENT_CLINIC_OR_DEPARTMENT_OTHER): Payer: Medicare HMO | Admitting: Physical Medicine & Rehabilitation

## 2016-09-20 ENCOUNTER — Encounter: Payer: Medicare HMO | Attending: Physical Medicine & Rehabilitation

## 2016-09-20 ENCOUNTER — Encounter: Payer: Self-pay | Admitting: Physical Medicine & Rehabilitation

## 2016-09-20 VITALS — BP 119/83 | HR 80

## 2016-09-20 DIAGNOSIS — F17210 Nicotine dependence, cigarettes, uncomplicated: Secondary | ICD-10-CM | POA: Insufficient documentation

## 2016-09-20 DIAGNOSIS — M791 Myalgia: Secondary | ICD-10-CM | POA: Diagnosis not present

## 2016-09-20 DIAGNOSIS — M1612 Unilateral primary osteoarthritis, left hip: Secondary | ICD-10-CM | POA: Diagnosis not present

## 2016-09-20 DIAGNOSIS — M47816 Spondylosis without myelopathy or radiculopathy, lumbar region: Secondary | ICD-10-CM | POA: Diagnosis not present

## 2016-09-20 DIAGNOSIS — K219 Gastro-esophageal reflux disease without esophagitis: Secondary | ICD-10-CM | POA: Diagnosis not present

## 2016-09-20 DIAGNOSIS — I10 Essential (primary) hypertension: Secondary | ICD-10-CM | POA: Diagnosis not present

## 2016-09-20 MED ORDER — TRAMADOL HCL 50 MG PO TABS: 50.0000 mg | ORAL_TABLET | Freq: Two times a day (BID) | ORAL | 2 refills | Status: DC

## 2016-09-20 NOTE — Progress Notes (Signed)
Left Hip Injection Fluoro guided  Indication left hip osteoarthritis with pain that interferes with ambulation and only partially responsive to medication management and other conservative care.  Patient placed supine on the fluoroscopy table left groin area was marked, prepped with Betadine, a 25-gauge 1.5 inch needle was used to anesthetize the skin and subcutaneous tissue. A 22-gauge 3.5" needle was inserted under fluoroscopic guidance targeting the surgical neck of the femoral head, [id number]-200 was injected to confirm intracapsular spread. This was obtained after needle adjustment. There was negative blood, with needle aspiration. Then 1 mL of 6 mg per mL Celestone plus 4 mL 1% lidocaine injected Patient tolerated procedure well. Post procedure instructions given

## 2016-09-20 NOTE — Patient Instructions (Signed)
You may use ice 20 minutes at a time. If you have pain or bruising in the left groin area. Please call if increasing pain over the next 2-3 days.

## 2016-09-20 NOTE — Progress Notes (Signed)
  PROCEDURE RECORD [facility]   Name: [name] DOB:1965/05/29 MRN: [id number]  Date:09/20/2016  Physician: Alysia Penna, MD    Nurse/CMA: Aryahna Spagna CMA  Allergies:  Allergies  Allergen Reactions  . Augmentin [Amoxicillin-Pot Clavulanate] Nausea And Vomiting  . Shellfish Allergy Hives and Swelling  . Ibuprofen Hives and Swelling  . Unasyn [Ampicillin-Sulbactam Sodium] Hives and Swelling    Consent Signed: Yes.    Is patient diabetic? No.  CBG today? NA  Pregnant: No. LMP: No LMP recorded. Patient has had a hysterectomy. (age 89-55)  Anticoagulants: no Anti-inflammatory: no Antibiotics: no  Procedure: Left hip steriod injection fluro Position: Supine   Start Time: 926am  End Time:  935am Fluoro Time: 30s  RN/CMA Korrin Waterfield CMA Jibri Schriefer CMA    Time 912 936am    BP 119/83 139/89    Pulse 80 76    Respirations 16 16    O2 Sat 96 96    S/S 6 6    Pain Level 7/10 4/10     D/C home with [name], patient A & O X 3, D/C instructions reviewed, and sits independently.

## 2016-10-04 DIAGNOSIS — J069 Acute upper respiratory infection, unspecified: Secondary | ICD-10-CM | POA: Diagnosis not present

## 2016-10-04 DIAGNOSIS — Z6824 Body mass index (BMI) 24.0-24.9, adult: Secondary | ICD-10-CM | POA: Diagnosis not present

## 2016-10-04 DIAGNOSIS — J019 Acute sinusitis, unspecified: Secondary | ICD-10-CM | POA: Diagnosis not present

## 2016-11-01 ENCOUNTER — Encounter: Payer: Medicare HMO | Attending: Physical Medicine & Rehabilitation

## 2016-11-01 ENCOUNTER — Ambulatory Visit (HOSPITAL_BASED_OUTPATIENT_CLINIC_OR_DEPARTMENT_OTHER): Payer: Medicare HMO | Admitting: Physical Medicine & Rehabilitation

## 2016-11-01 VITALS — BP 137/93 | HR 67

## 2016-11-01 DIAGNOSIS — Z9889 Other specified postprocedural states: Secondary | ICD-10-CM | POA: Diagnosis not present

## 2016-11-01 DIAGNOSIS — M47816 Spondylosis without myelopathy or radiculopathy, lumbar region: Secondary | ICD-10-CM | POA: Insufficient documentation

## 2016-11-01 DIAGNOSIS — G8929 Other chronic pain: Secondary | ICD-10-CM

## 2016-11-01 DIAGNOSIS — F17210 Nicotine dependence, cigarettes, uncomplicated: Secondary | ICD-10-CM | POA: Diagnosis not present

## 2016-11-01 DIAGNOSIS — I10 Essential (primary) hypertension: Secondary | ICD-10-CM | POA: Diagnosis not present

## 2016-11-01 DIAGNOSIS — M1612 Unilateral primary osteoarthritis, left hip: Secondary | ICD-10-CM | POA: Diagnosis not present

## 2016-11-01 DIAGNOSIS — K219 Gastro-esophageal reflux disease without esophagitis: Secondary | ICD-10-CM | POA: Insufficient documentation

## 2016-11-01 DIAGNOSIS — Z9049 Acquired absence of other specified parts of digestive tract: Secondary | ICD-10-CM | POA: Insufficient documentation

## 2016-11-01 DIAGNOSIS — M25552 Pain in left hip: Secondary | ICD-10-CM

## 2016-11-01 DIAGNOSIS — Z90711 Acquired absence of uterus with remaining cervical stump: Secondary | ICD-10-CM | POA: Diagnosis not present

## 2016-11-01 MED ORDER — TRAMADOL HCL 50 MG PO TABS: 50.0000 mg | ORAL_TABLET | Freq: Two times a day (BID) | ORAL | 5 refills | Status: DC
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
## 2016-11-01 NOTE — Patient Instructions (Signed)
If Back starts to become more painful, please call to schedule repeat injections

## 2016-11-05 DIAGNOSIS — Z6824 Body mass index (BMI) 24.0-24.9, adult: Secondary | ICD-10-CM | POA: Diagnosis not present

## 2016-11-05 DIAGNOSIS — J329 Chronic sinusitis, unspecified: Secondary | ICD-10-CM | POA: Diagnosis not present

## 2016-11-05 DIAGNOSIS — M1991 Primary osteoarthritis, unspecified site: Secondary | ICD-10-CM | POA: Diagnosis not present

## 2016-11-05 DIAGNOSIS — I10 Essential (primary) hypertension: Secondary | ICD-10-CM | POA: Diagnosis not present

## 2016-11-08 DIAGNOSIS — J069 Acute upper respiratory infection, unspecified: Secondary | ICD-10-CM | POA: Diagnosis not present

## 2016-11-08 DIAGNOSIS — Z6823 Body mass index (BMI) 23.0-23.9, adult: Secondary | ICD-10-CM | POA: Diagnosis not present

## 2016-11-08 DIAGNOSIS — J209 Acute bronchitis, unspecified: Secondary | ICD-10-CM | POA: Diagnosis not present

## 2016-12-30 ENCOUNTER — Other Ambulatory Visit (HOSPITAL_COMMUNITY): Payer: Self-pay | Admitting: Family Medicine

## 2016-12-30 ENCOUNTER — Ambulatory Visit (HOSPITAL_COMMUNITY)
Admission: RE | Admit: 2016-12-30 | Discharge: 2016-12-30 | Disposition: A | Discharge status: Home / Self Care | Payer: Medicare HMO | Source: Ambulatory Visit | Attending: Family Medicine | Admitting: Family Medicine

## 2016-12-30 DIAGNOSIS — Z1389 Encounter for screening for other disorder: Secondary | ICD-10-CM

## 2016-12-30 DIAGNOSIS — J069 Acute upper respiratory infection, unspecified: Secondary | ICD-10-CM | POA: Insufficient documentation

## 2016-12-30 DIAGNOSIS — R05 Cough: Secondary | ICD-10-CM | POA: Diagnosis not present

## 2016-12-30 DIAGNOSIS — Z6824 Body mass index (BMI) 24.0-24.9, adult: Secondary | ICD-10-CM | POA: Diagnosis not present

## 2017-02-03 ENCOUNTER — Encounter: Payer: Medicare HMO | Attending: Physical Medicine & Rehabilitation

## 2017-02-03 ENCOUNTER — Ambulatory Visit: Payer: Medicare HMO | Admitting: Physical Medicine & Rehabilitation

## 2017-02-03 ENCOUNTER — Encounter: Payer: Self-pay | Admitting: Physical Medicine & Rehabilitation

## 2017-02-03 VITALS — BP 121/85 | HR 83

## 2017-02-03 DIAGNOSIS — I10 Essential (primary) hypertension: Secondary | ICD-10-CM | POA: Insufficient documentation

## 2017-02-03 DIAGNOSIS — M1612 Unilateral primary osteoarthritis, left hip: Secondary | ICD-10-CM | POA: Diagnosis not present

## 2017-02-03 DIAGNOSIS — Z90711 Acquired absence of uterus with remaining cervical stump: Secondary | ICD-10-CM | POA: Diagnosis not present

## 2017-02-03 DIAGNOSIS — Z9889 Other specified postprocedural states: Secondary | ICD-10-CM | POA: Insufficient documentation

## 2017-02-03 DIAGNOSIS — K219 Gastro-esophageal reflux disease without esophagitis: Secondary | ICD-10-CM | POA: Diagnosis not present

## 2017-02-03 DIAGNOSIS — M47816 Spondylosis without myelopathy or radiculopathy, lumbar region: Secondary | ICD-10-CM | POA: Diagnosis not present

## 2017-02-03 DIAGNOSIS — F17210 Nicotine dependence, cigarettes, uncomplicated: Secondary | ICD-10-CM | POA: Insufficient documentation

## 2017-02-03 DIAGNOSIS — Z5181 Encounter for therapeutic drug level monitoring: Secondary | ICD-10-CM | POA: Diagnosis not present

## 2017-02-03 DIAGNOSIS — Z9049 Acquired absence of other specified parts of digestive tract: Secondary | ICD-10-CM | POA: Insufficient documentation

## 2017-02-03 MED ORDER — TRAMADOL HCL 50 MG PO TABS: 25.0000 mg | ORAL_TABLET | Freq: Four times a day (QID) | ORAL | 0 refills | Status: DC | PRN

## 2017-02-03 NOTE — Progress Notes (Signed)
Subjective:    Patient ID: [name], female    DOB: 07-19-1965, 52 y.o.   MRN: [id number]  HPI   CC: Back > L hip pain  Back pain is non radiating Pain inceased with bending  Pt feels tramadol is making her nauseated Gets nausea during the day Pt keeps her mom with dementia during the day Notes that nausea improves if she doesn't take the tramadol  Last MBB Aug 2018  No OTCs like tylenol or ibuprofen Pain Inventory Average Pain 4 Pain Right Now 7 My pain is constant, sharp and stabbing  In the last 24 hours, has pain interfered with the following? General activity 8 Relation with others 6 Enjoyment of life 8 What TIME of day is your pain at its worst? all Sleep (in general) Poor  Pain is worse with: walking, bending, inactivity and standing Pain improves with: medication Relief from Meds: 4  Mobility walk without assistance ability to climb steps?  yes do you drive?  yes  Function disabled: date disabled 2008  Neuro/Psych No problems in this area  Prior Studies Any changes since last visit?  no  Physicians involved in your care Any changes since last visit?  no   No family history on file. Social History   Socioeconomic History  . Marital status: Widowed    Spouse name: Not on file  . Number of children: Not on file  . Years of education: Not on file  . Highest education level: Not on file  Social Needs  . Financial resource strain: Not on file  . Food insecurity - worry: Not on file  . Food insecurity - inability: Not on file  . Transportation needs - medical: Not on file  . Transportation needs - non-medical: Not on file  Occupational History  . Not on file  Tobacco Use  . Smoking status: Current Every Day Smoker    Packs/day: 0.50    Types: Cigarettes  . Smokeless tobacco: Never Used  Substance and Sexual Activity  . Alcohol use: No  . Drug use: No  . Sexual activity: Not on file  Other Topics Concern  . Not on file  Social  History Narrative  . Not on file   Past Surgical History:  Procedure Laterality Date  . ABDOMINAL SURGERY    . APPENDECTOMY    . CHOLECYSTECTOMY    . COLONOSCOPY N/A 02/01/2014   2 SIMPLE ADNEOMA(HF), HYEPRPLASTIC RECTAL POLYPS  . DILATION AND CURETTAGE OF UTERUS    . NECK SURGERY    . PARTIAL HYSTERECTOMY     Past Medical History:  Diagnosis Date  . Degenerative disk disease   . GERD (gastroesophageal reflux disease)   . Hypertension    There were no vitals taken for this visit.  Opioid Risk Score:   Fall Risk Score:  `1  Depression screen PHQ 2/9  Depression screen PHQ 2/9 11/07/2015  Decreased Interest 2  Down, Depressed, Hopeless 1  PHQ - 2 Score 3  Altered sleeping 3  Tired, decreased energy 2  Change in appetite 2  Feeling bad or failure about yourself  0  Trouble concentrating 1  Moving slowly or fidgety/restless 2  Suicidal thoughts 0  PHQ-9 Score 13     Review of Systems  Constitutional: Negative.   HENT: Negative.   Eyes: Negative.   Respiratory: Negative.   Cardiovascular: Negative.   Gastrointestinal: Negative.   Endocrine: Negative.   Genitourinary: Negative.   Musculoskeletal: Negative.   Skin:  Negative.   Allergic/Immunologic: Negative.   Neurological: Negative.   Hematological: Negative.   Psychiatric/Behavioral: Negative.   All other systems reviewed and are negative.      Objective:   Physical Exam  Constitutional: She appears well-developed and well-nourished. No distress.  HENT:  Head: Normocephalic and atraumatic.  Eyes: Conjunctivae are normal. Pupils are equal, round, and reactive to light.  Skin: She is not diaphoretic.  Psychiatric: She has a normal mood and affect. She is withdrawn.  Nursing note and vitals reviewed.  Motor strength is 5/5 bilateral hip flexor knee extensor ankle dorsiflexors. Ambulates without assistive device no evidence of toe drag or knee instability.       Assessment & Plan:  1.  Chronic low  back pain with lumbar spondylosis.  She is approximately 5 months post lumbar medial branch blocks She has had recurrence of pain.  I will repeat L3-L4 medial branch and L5 dorsal ramus injection under fluoroscopic guidance  2.  Left hip pain which has responded well to intra-articular hip injection under fluoroscopic guidance.  May need to repeat in 1-2 months given some recurrence of pain.

## 2017-02-11 LAB — TOXASSURE SELECT,+ANTIDEPR,UR

## 2017-02-17 ENCOUNTER — Ambulatory Visit (HOSPITAL_BASED_OUTPATIENT_CLINIC_OR_DEPARTMENT_OTHER): Payer: Medicare HMO | Admitting: Physical Medicine & Rehabilitation

## 2017-02-17 ENCOUNTER — Encounter: Payer: Medicare HMO | Attending: Physical Medicine & Rehabilitation

## 2017-02-17 ENCOUNTER — Encounter: Payer: Self-pay | Admitting: Physical Medicine & Rehabilitation

## 2017-02-17 VITALS — BP 105/72 | HR 93

## 2017-02-17 DIAGNOSIS — Z5181 Encounter for therapeutic drug level monitoring: Secondary | ICD-10-CM | POA: Diagnosis not present

## 2017-02-17 DIAGNOSIS — Z90711 Acquired absence of uterus with remaining cervical stump: Secondary | ICD-10-CM | POA: Diagnosis not present

## 2017-02-17 DIAGNOSIS — Z9049 Acquired absence of other specified parts of digestive tract: Secondary | ICD-10-CM | POA: Insufficient documentation

## 2017-02-17 DIAGNOSIS — M47816 Spondylosis without myelopathy or radiculopathy, lumbar region: Secondary | ICD-10-CM

## 2017-02-17 DIAGNOSIS — I10 Essential (primary) hypertension: Secondary | ICD-10-CM | POA: Diagnosis not present

## 2017-02-17 DIAGNOSIS — M1612 Unilateral primary osteoarthritis, left hip: Secondary | ICD-10-CM | POA: Diagnosis not present

## 2017-02-17 DIAGNOSIS — F17210 Nicotine dependence, cigarettes, uncomplicated: Secondary | ICD-10-CM | POA: Diagnosis not present

## 2017-02-17 DIAGNOSIS — Z9889 Other specified postprocedural states: Secondary | ICD-10-CM | POA: Insufficient documentation

## 2017-02-17 DIAGNOSIS — K219 Gastro-esophageal reflux disease without esophagitis: Secondary | ICD-10-CM | POA: Insufficient documentation

## 2017-02-17 MED ORDER — TRAMADOL HCL 50 MG PO TABS: 25.0000 mg | ORAL_TABLET | Freq: Four times a day (QID) | ORAL | 5 refills | Status: DC | PRN

## 2017-02-17 NOTE — Progress Notes (Signed)
  PROCEDURE RECORD [facility]   Name: [name] DOB:[birth year] MRN: [id number]  Date:02/17/2017  Physician: Alysia Penna, MD    Nurse/CMA: Shumaker RN Allergies:  Allergies  Allergen Reactions  . Augmentin [Amoxicillin-Pot Clavulanate] Nausea And Vomiting  . Shellfish Allergy Hives and Swelling  . Ibuprofen Hives and Swelling  . Unasyn [Ampicillin-Sulbactam Sodium] Hives and Swelling    Consent Signed: Yes.    Is patient diabetic? No.  CBG today?   Pregnant: No. LMP: No LMP recorded. Patient has had a hysterectomy. (age 58-55)  Anticoagulants: no Anti-inflammatory: no Antibiotics: no  Procedure Bilateral Medial Branch Block L3-4-5 Position: Prone Start Time: 11:03 End Time:  11:12    Fluoro Time:43  RN/CMA Health and safety inspector RN    Time 1045 11:18    BP 105/72 117/73    Pulse 93 85    Respirations 14 14    O2 Sat 93 97    S/S 6 6    Pain Level 7/10 4/10     D/C home with sister, patient A & O X 3, D/C instructions reviewed, and sits independently.

## 2017-02-17 NOTE — Patient Instructions (Signed)

## 2017-02-17 NOTE — Progress Notes (Signed)
Bilateral Lumbar L3, L4  medial branch blocks and L 5 dorsal ramus injection under fluoroscopic guidance  Indication: Lumbar pain which is not relieved by medication management or other conservative care and interfering with self-care and mobility.  Informed consent was obtained after describing risks and benefits of the procedure with the patient, this includes bleeding, infection, paralysis and medication side effects.  The patient wishes to proceed and has given written consent.  The patient was placed in prone position.  The lumbar area was marked and prepped with Betadine.  One mL of 1% lidocaine was injected into each of 6 areas into the skin and subcutaneous tissue.  Then a 22-gauge 3.5in spinal needle was inserted targeting the junction of the left S1 superior articular process and sacral ala junction. Needle was advanced under fluoroscopic guidance.  Bone contact was made.  Isovue 200 was injected x 0.5 mL demonstrating no intravascular uptake.  Then a solution  of 2% MPF lidocaine was injected x 0.5 mL.  Then the left L5 superior articular process in transverse process junction was targeted.  Bone contact was made.  Isovue 200 was injected x 0.5 mL demonstrating no intravascular uptake. Then a solution containing  2% MPF lidocaine was injected x 0.5 mL.  Then the left L4 superior articular process in transverse process junction was targeted.  Bone contact was made.  Isovue 200 was injected x 0.5 mL demonstrating no intravascular uptake.  Then a solution containing2% MPF lidocaine was injected x 0.5 mL.  This same procedure was performed on the right side using the same needle, technique and injectate.  Patient tolerated procedure well.  Post procedure instructions were given.  

## 2017-03-17 ENCOUNTER — Ambulatory Visit (HOSPITAL_BASED_OUTPATIENT_CLINIC_OR_DEPARTMENT_OTHER): Payer: Medicare HMO | Admitting: Physical Medicine & Rehabilitation

## 2017-03-17 ENCOUNTER — Encounter: Payer: Self-pay | Admitting: Physical Medicine & Rehabilitation

## 2017-03-17 ENCOUNTER — Encounter: Payer: Medicare HMO | Attending: Physical Medicine & Rehabilitation

## 2017-03-17 VITALS — BP 125/88 | HR 78

## 2017-03-17 DIAGNOSIS — F17210 Nicotine dependence, cigarettes, uncomplicated: Secondary | ICD-10-CM | POA: Insufficient documentation

## 2017-03-17 DIAGNOSIS — K219 Gastro-esophageal reflux disease without esophagitis: Secondary | ICD-10-CM | POA: Insufficient documentation

## 2017-03-17 DIAGNOSIS — M1612 Unilateral primary osteoarthritis, left hip: Secondary | ICD-10-CM | POA: Insufficient documentation

## 2017-03-17 DIAGNOSIS — I10 Essential (primary) hypertension: Secondary | ICD-10-CM | POA: Insufficient documentation

## 2017-03-17 DIAGNOSIS — M47816 Spondylosis without myelopathy or radiculopathy, lumbar region: Secondary | ICD-10-CM | POA: Diagnosis not present

## 2017-03-17 DIAGNOSIS — Z90711 Acquired absence of uterus with remaining cervical stump: Secondary | ICD-10-CM | POA: Insufficient documentation

## 2017-03-17 DIAGNOSIS — Z9889 Other specified postprocedural states: Secondary | ICD-10-CM | POA: Diagnosis not present

## 2017-03-17 DIAGNOSIS — Z5181 Encounter for therapeutic drug level monitoring: Secondary | ICD-10-CM | POA: Insufficient documentation

## 2017-03-17 DIAGNOSIS — Z9049 Acquired absence of other specified parts of digestive tract: Secondary | ICD-10-CM | POA: Diagnosis not present

## 2017-03-17 NOTE — Progress Notes (Signed)
hip intra-articular injection under ultrasound guidance  Severe left hip osteoarthritis with pain that limits mobility,, unresponsive to medication management.  Informed consent was obtained after describing risks and benefits of the procedure with patient . Patient has given written consent  Patient placed supine on exam table. The femoral artery was scanned with Doppler and marked The femoral head was identified as well as this femoral neck. Area was marked then prepped with Betadine, sterile drape  A 25-gauge 1.5 inch needle was used to anesthetize the skin and subcutaneous tissue with 3 cc of 1% lidocaine under direct ultrasound visualization. Then a 22-gauge 80 mm Echo block needle was inserted under direct ultrasound visualization targeting the junction of the femoral neck and the femoral head. Once bone contact was made a solution containing 1 cc of 6 mg/cc betamethasone and 4 cc of 1% lidocaine were injected under ultrasound visualization. Patient tolerated procedure well Images saved 

## 2017-03-17 NOTE — Patient Instructions (Signed)
Left hip injection with lidocaine and celestone, should take 2-3 days to take full effect

## 2017-05-02 ENCOUNTER — Ambulatory Visit: Payer: Medicare HMO | Admitting: Physical Medicine & Rehabilitation

## 2017-05-24 DIAGNOSIS — Z6824 Body mass index (BMI) 24.0-24.9, adult: Secondary | ICD-10-CM | POA: Diagnosis not present

## 2017-05-24 DIAGNOSIS — Z1389 Encounter for screening for other disorder: Secondary | ICD-10-CM | POA: Diagnosis not present

## 2017-05-24 DIAGNOSIS — I10 Essential (primary) hypertension: Secondary | ICD-10-CM | POA: Diagnosis not present

## 2017-06-13 DIAGNOSIS — I10 Essential (primary) hypertension: Secondary | ICD-10-CM | POA: Diagnosis not present

## 2017-06-13 DIAGNOSIS — R945 Abnormal results of liver function studies: Secondary | ICD-10-CM | POA: Diagnosis not present

## 2017-06-13 DIAGNOSIS — Z1389 Encounter for screening for other disorder: Secondary | ICD-10-CM | POA: Diagnosis not present

## 2017-06-13 DIAGNOSIS — E782 Mixed hyperlipidemia: Secondary | ICD-10-CM | POA: Diagnosis not present

## 2017-06-13 DIAGNOSIS — R7309 Other abnormal glucose: Secondary | ICD-10-CM | POA: Diagnosis not present

## 2017-06-13 DIAGNOSIS — D509 Iron deficiency anemia, unspecified: Secondary | ICD-10-CM | POA: Diagnosis not present

## 2017-06-13 DIAGNOSIS — Z0001 Encounter for general adult medical examination with abnormal findings: Secondary | ICD-10-CM | POA: Diagnosis not present

## 2017-06-13 DIAGNOSIS — M150 Primary generalized (osteo)arthritis: Secondary | ICD-10-CM | POA: Diagnosis not present

## 2017-06-13 DIAGNOSIS — E669 Obesity, unspecified: Secondary | ICD-10-CM | POA: Diagnosis not present

## 2017-06-13 DIAGNOSIS — G43909 Migraine, unspecified, not intractable, without status migrainosus: Secondary | ICD-10-CM | POA: Diagnosis not present

## 2017-06-13 DIAGNOSIS — Z6824 Body mass index (BMI) 24.0-24.9, adult: Secondary | ICD-10-CM | POA: Diagnosis not present

## 2017-06-17 ENCOUNTER — Encounter: Payer: Self-pay | Admitting: Physical Medicine & Rehabilitation

## 2017-06-17 ENCOUNTER — Ambulatory Visit: Payer: Medicare HMO | Admitting: Physical Medicine & Rehabilitation

## 2017-06-17 ENCOUNTER — Encounter: Payer: Medicare HMO | Attending: Physical Medicine & Rehabilitation

## 2017-06-17 VITALS — BP 129/88 | HR 71 | Resp 14 | Ht 67.0 in | Wt 156.0 lb

## 2017-06-17 DIAGNOSIS — Z9889 Other specified postprocedural states: Secondary | ICD-10-CM | POA: Diagnosis not present

## 2017-06-17 DIAGNOSIS — Z9049 Acquired absence of other specified parts of digestive tract: Secondary | ICD-10-CM | POA: Insufficient documentation

## 2017-06-17 DIAGNOSIS — I10 Essential (primary) hypertension: Secondary | ICD-10-CM | POA: Insufficient documentation

## 2017-06-17 DIAGNOSIS — M47816 Spondylosis without myelopathy or radiculopathy, lumbar region: Secondary | ICD-10-CM | POA: Insufficient documentation

## 2017-06-17 DIAGNOSIS — M1612 Unilateral primary osteoarthritis, left hip: Secondary | ICD-10-CM | POA: Insufficient documentation

## 2017-06-17 DIAGNOSIS — Z5181 Encounter for therapeutic drug level monitoring: Secondary | ICD-10-CM | POA: Diagnosis not present

## 2017-06-17 DIAGNOSIS — Z90711 Acquired absence of uterus with remaining cervical stump: Secondary | ICD-10-CM | POA: Diagnosis not present

## 2017-06-17 DIAGNOSIS — K219 Gastro-esophageal reflux disease without esophagitis: Secondary | ICD-10-CM | POA: Insufficient documentation

## 2017-06-17 DIAGNOSIS — F17210 Nicotine dependence, cigarettes, uncomplicated: Secondary | ICD-10-CM | POA: Insufficient documentation

## 2017-06-17 NOTE — Progress Notes (Signed)
Subjective:    Patient ID: [name], female    DOB: 04-03-1965, 52 y.o.   MRN: [id number]  HPI CC:  Left hip pin  Had good relief with Intra artic hip inj 03/17/2017 started to wear off  Planted a garden, has 6 rows each 1 is about a football field long.  She has a 2.9 acre yard Patient has no significant low back pain at the current time.  No numbness or tingling in the legs. She is independent with all her self-care and mobility.  She continues on tramadol 25 mg 4 times per day as needed.  She has 2 more refills remaining Patient has pain with left hip Pain Inventory Average Pain 8 Pain Right Now 7 My pain is sharp, burning, stabbing and tingling  In the last 24 hours, has pain interfered with the following? General activity 6 Relation with others 6 Enjoyment of life 6 What TIME of day is your pain at its worst? all Sleep (in general) Poor  Pain is worse with: walking, bending, sitting, inactivity, standing and some activites Pain improves with: injections Relief from Meds: n/a  Mobility walk without assistance how many minutes can you walk? 10-15 ability to climb steps?  yes do you drive?  yes Do you have any goals in this area?  yes  Function disabled: date disabled . I need assistance with the following:  meal prep, household duties and shopping Do you have any goals in this area?  yes  Neuro/Psych No problems in this area  Prior Studies Any changes since last visit?  no  Physicians involved in your care Any changes since last visit?  no   History reviewed. No pertinent family history. Social History   Socioeconomic History  . Marital status: Widowed    Spouse name: Not on file  . Number of children: Not on file  . Years of education: Not on file  . Highest education level: Not on file  Occupational History  . Not on file  Social Needs  . Financial resource strain: Not on file  . Food insecurity:    Worry: Not on file    Inability: Not on  file  . Transportation needs:    Medical: Not on file    Non-medical: Not on file  Tobacco Use  . Smoking status: Current Every Day Smoker    Packs/day: 0.50    Types: Cigarettes  . Smokeless tobacco: Never Used  Substance and Sexual Activity  . Alcohol use: No  . Drug use: No  . Sexual activity: Not on file  Lifestyle  . Physical activity:    Days per week: Not on file    Minutes per session: Not on file  . Stress: Not on file  Relationships  . Social connections:    Talks on phone: Not on file    Gets together: Not on file    Attends religious service: Not on file    Active member of club or organization: Not on file    Attends meetings of clubs or organizations: Not on file    Relationship status: Not on file  Other Topics Concern  . Not on file  Social History Narrative  . Not on file   Past Surgical History:  Procedure Laterality Date  . ABDOMINAL SURGERY    . APPENDECTOMY    . CHOLECYSTECTOMY    . COLONOSCOPY N/A 02/01/2014   2 SIMPLE ADNEOMA(HF), HYEPRPLASTIC RECTAL POLYPS  . DILATION AND CURETTAGE OF UTERUS    .  NECK SURGERY    . PARTIAL HYSTERECTOMY     Past Medical History:  Diagnosis Date  . Degenerative disk disease   . GERD (gastroesophageal reflux disease)   . Hypertension    BP 129/88 (BP Location: Left Arm, Patient Position: Sitting, Cuff Size: Normal)   Pulse 71   Resp 14   Ht 5\' 7"  (1.702 m)   Wt 156 lb (70.8 kg)   SpO2 93%   BMI 24.43 kg/m   Opioid Risk Score:   Fall Risk Score:  `1  Depression screen PHQ 2/9  Depression screen [facility] 2/9 02/17/2017 11/07/2015  Decreased Interest 2 2  Down, Depressed, Hopeless 1 1  PHQ - 2 Score 3 3  Altered sleeping - 3  Tired, decreased energy - 2  Change in appetite - 2  Feeling bad or failure about yourself  - 0  Trouble concentrating - 1  Moving slowly or fidgety/restless - 2  Suicidal thoughts - 0  PHQ-9 Score - 13    Review of Systems  Constitutional: Negative.   HENT: Negative.     Eyes: Negative.   Respiratory: Negative.   Cardiovascular: Negative.   Gastrointestinal: Negative.   Endocrine: Negative.   Genitourinary: Negative.   Musculoskeletal: Positive for arthralgias and back pain.  Skin: Negative.   Allergic/Immunologic: Negative.   Neurological: Negative.   Hematological: Negative.   Psychiatric/Behavioral: Negative.        Objective:   Physical Exam  Constitutional: She is oriented to person, place, and time. She appears well-developed and well-nourished.  HENT:  Head: Normocephalic and atraumatic.  Eyes: Pupils are equal, round, and reactive to light. EOM are normal.  Musculoskeletal: She exhibits no edema or deformity.  Neurological: She is alert and oriented to person, place, and time.  Skin: Skin is warm and dry.  Psychiatric: She has a normal mood and affect.  Nursing note and vitals reviewed.  Internal and external rotation left hip is painful and mildly restricted.  Right-sided internal/external rotation is normal. Lumbar flexion extension lateral bending and rotation is 75% of normal but not accompanied by pain. Negative straight leg raising Lower extremity strength is 5/5 bilateral hip flexor knee extensor ankle dorsi flexor Normal sensation light touch bilateral lower limbs Gait without evidence of toe drag or knee instability she does not require an assistive device.       Assessment & Plan:  1.  Left hip osteoarthritis has good relief with intra-articular injection last one performed with ultrasound guidance.  May repeat in 3 weeks. 2.  Lumbar spondylosis without myelopathy doing well after medial branch blocks last performed around 4 months ago. She will continue with tramadol 25 mg 4 times per day as needed PMP aware website reviewed Last urine drug screen 02/03/2017 was consistent she was not taking any medications for pain at that time

## 2017-06-22 DIAGNOSIS — M1991 Primary osteoarthritis, unspecified site: Secondary | ICD-10-CM | POA: Diagnosis not present

## 2017-06-22 DIAGNOSIS — S76101A Unspecified injury of right quadriceps muscle, fascia and tendon, initial encounter: Secondary | ICD-10-CM | POA: Diagnosis not present

## 2017-06-22 DIAGNOSIS — Z6823 Body mass index (BMI) 23.0-23.9, adult: Secondary | ICD-10-CM | POA: Diagnosis not present

## 2017-06-22 DIAGNOSIS — I10 Essential (primary) hypertension: Secondary | ICD-10-CM | POA: Diagnosis not present

## 2017-07-05 ENCOUNTER — Ambulatory Visit: Payer: Medicare HMO | Admitting: Physical Medicine & Rehabilitation

## 2017-07-18 ENCOUNTER — Encounter: Payer: Self-pay | Admitting: Physical Medicine & Rehabilitation

## 2017-07-18 ENCOUNTER — Encounter: Payer: Medicare HMO | Attending: Physical Medicine & Rehabilitation

## 2017-07-18 ENCOUNTER — Ambulatory Visit (HOSPITAL_BASED_OUTPATIENT_CLINIC_OR_DEPARTMENT_OTHER): Payer: Medicare HMO | Admitting: Physical Medicine & Rehabilitation

## 2017-07-18 VITALS — BP 134/84 | HR 65 | Ht 67.0 in | Wt 149.0 lb

## 2017-07-18 DIAGNOSIS — K219 Gastro-esophageal reflux disease without esophagitis: Secondary | ICD-10-CM | POA: Diagnosis not present

## 2017-07-18 DIAGNOSIS — Z90711 Acquired absence of uterus with remaining cervical stump: Secondary | ICD-10-CM | POA: Insufficient documentation

## 2017-07-18 DIAGNOSIS — M1612 Unilateral primary osteoarthritis, left hip: Secondary | ICD-10-CM

## 2017-07-18 DIAGNOSIS — Z9889 Other specified postprocedural states: Secondary | ICD-10-CM | POA: Diagnosis not present

## 2017-07-18 DIAGNOSIS — I10 Essential (primary) hypertension: Secondary | ICD-10-CM | POA: Insufficient documentation

## 2017-07-18 DIAGNOSIS — Z9049 Acquired absence of other specified parts of digestive tract: Secondary | ICD-10-CM | POA: Insufficient documentation

## 2017-07-18 DIAGNOSIS — F17210 Nicotine dependence, cigarettes, uncomplicated: Secondary | ICD-10-CM | POA: Insufficient documentation

## 2017-07-18 DIAGNOSIS — M47816 Spondylosis without myelopathy or radiculopathy, lumbar region: Secondary | ICD-10-CM | POA: Diagnosis not present

## 2017-07-18 DIAGNOSIS — Z5181 Encounter for therapeutic drug level monitoring: Secondary | ICD-10-CM | POA: Insufficient documentation

## 2017-07-18 MED ORDER — TRAMADOL HCL 50 MG PO TABS: 25.0000 mg | ORAL_TABLET | Freq: Four times a day (QID) | ORAL | 5 refills | Status: DC | PRN

## 2017-07-18 NOTE — Progress Notes (Signed)
hip intra-articular injection under ultrasound guidance  Severe left hip osteoarthritis with pain that limits mobility,, unresponsive to medication management.  Informed consent was obtained after describing risks and benefits of the procedure with patient . Patient has given written consent  Patient placed supine on exam table. The femoral artery was scanned with Doppler and marked The femoral head was identified as well as this femoral neck. Area was marked then prepped with Betadine, sterile drape  A 25-gauge 1.5 inch needle was used to anesthetize the skin and subcutaneous tissue with 3 cc of 1% lidocaine under direct ultrasound visualization. Then a 22-gauge 80 mm Echo block needle was inserted under direct ultrasound visualization targeting the junction of the femoral neck and the femoral head. Once bone contact was made a solution containing 1 cc of 6 mg/cc betamethasone and 4 cc of 1% lidocaine were injected under ultrasound visualization. Patient tolerated procedure well Images saved

## 2017-07-18 NOTE — Patient Instructions (Signed)
Please call if you need lumbar medial branch blocks for back  Or  Repeat left hip ultrasound guided injection

## 2017-08-04 DIAGNOSIS — G5702 Lesion of sciatic nerve, left lower limb: Secondary | ICD-10-CM | POA: Diagnosis not present

## 2017-08-04 DIAGNOSIS — Z6823 Body mass index (BMI) 23.0-23.9, adult: Secondary | ICD-10-CM | POA: Diagnosis not present

## 2017-08-04 DIAGNOSIS — H6993 Unspecified Eustachian tube disorder, bilateral: Secondary | ICD-10-CM | POA: Diagnosis not present

## 2017-09-02 DIAGNOSIS — Z6823 Body mass index (BMI) 23.0-23.9, adult: Secondary | ICD-10-CM | POA: Diagnosis not present

## 2017-09-02 DIAGNOSIS — J069 Acute upper respiratory infection, unspecified: Secondary | ICD-10-CM | POA: Diagnosis not present

## 2017-09-29 ENCOUNTER — Ambulatory Visit (HOSPITAL_BASED_OUTPATIENT_CLINIC_OR_DEPARTMENT_OTHER): Payer: Medicare HMO | Admitting: Physical Medicine & Rehabilitation

## 2017-09-29 ENCOUNTER — Encounter: Payer: Self-pay | Admitting: Physical Medicine & Rehabilitation

## 2017-09-29 ENCOUNTER — Encounter: Payer: Medicare HMO | Attending: Physical Medicine & Rehabilitation

## 2017-09-29 VITALS — BP 136/84 | HR 66 | Ht 67.0 in | Wt 151.0 lb

## 2017-09-29 DIAGNOSIS — Z9049 Acquired absence of other specified parts of digestive tract: Secondary | ICD-10-CM | POA: Diagnosis not present

## 2017-09-29 DIAGNOSIS — M47816 Spondylosis without myelopathy or radiculopathy, lumbar region: Secondary | ICD-10-CM | POA: Insufficient documentation

## 2017-09-29 DIAGNOSIS — M1612 Unilateral primary osteoarthritis, left hip: Secondary | ICD-10-CM | POA: Insufficient documentation

## 2017-09-29 DIAGNOSIS — K219 Gastro-esophageal reflux disease without esophagitis: Secondary | ICD-10-CM | POA: Diagnosis not present

## 2017-09-29 DIAGNOSIS — Z9889 Other specified postprocedural states: Secondary | ICD-10-CM | POA: Insufficient documentation

## 2017-09-29 DIAGNOSIS — Z5181 Encounter for therapeutic drug level monitoring: Secondary | ICD-10-CM | POA: Insufficient documentation

## 2017-09-29 DIAGNOSIS — Z90711 Acquired absence of uterus with remaining cervical stump: Secondary | ICD-10-CM | POA: Insufficient documentation

## 2017-09-29 DIAGNOSIS — I10 Essential (primary) hypertension: Secondary | ICD-10-CM | POA: Insufficient documentation

## 2017-09-29 DIAGNOSIS — F17210 Nicotine dependence, cigarettes, uncomplicated: Secondary | ICD-10-CM | POA: Insufficient documentation

## 2017-09-29 NOTE — Progress Notes (Signed)
Bilateral Lumbar L3, L4  medial branch blocks and L 5 dorsal ramus injection under fluoroscopic guidance  Indication: Lumbar pain which is not relieved by medication management or other conservative care and interfering with self-care and mobility.  Informed consent was obtained after describing risks and benefits of the procedure with the patient, this includes bleeding, infection, paralysis and medication side effects.  The patient wishes to proceed and has given written consent.  The patient was placed in prone position.  The lumbar area was marked and prepped with Betadine.  One mL of 1% lidocaine was injected into each of 6 areas into the skin and subcutaneous tissue.  Then a 22-gauge 3.5in spinal needle was inserted targeting the junction of the left S1 superior articular process and sacral ala junction. Needle was advanced under fluoroscopic guidance.  Bone contact was made.  Isovue 200 was injected x 0.5 mL demonstrating no intravascular uptake.  Then a solution  of 2% MPF lidocaine was injected x 0.5 mL.  Then the left L5 superior articular process in transverse process junction was targeted.  Bone contact was made.  Isovue 200 was injected x 0.5 mL demonstrating no intravascular uptake. Then a solution containing  2% MPF lidocaine was injected x 0.5 mL.  Then the left L4 superior articular process in transverse process junction was targeted.  Bone contact was made.  Isovue 200 was injected x 0.5 mL demonstrating no intravascular uptake.  Then a solution containing2% MPF lidocaine was injected x 0.5 mL.  This same procedure was performed on the right side using the same needle, technique and injectate.  Patient tolerated procedure well.  Post procedure instructions were given.  

## 2017-09-29 NOTE — Patient Instructions (Signed)

## 2017-09-29 NOTE — Progress Notes (Signed)
  PROCEDURE RECORD [facility]   Name: [name] DOB:08/05/1965 MRN: [id number]  Date:09/29/2017  Physician: Alysia Penna, MD    Nurse/CMA: Bright, CMA  Allergies:  Allergies  Allergen Reactions  . Augmentin [Amoxicillin-Pot Clavulanate] Nausea And Vomiting  . Shellfish Allergy Hives and Swelling  . Ibuprofen Hives and Swelling  . Unasyn [Ampicillin-Sulbactam Sodium] Hives and Swelling    Consent Signed: Yes.    Is patient diabetic? No.  CBG today? NA  Pregnant: No. LMP: No LMP recorded. Patient has had a hysterectomy. (age 70-55)  Anticoagulants: no Anti-inflammatory: no Antibiotics: no  Procedure: bilateral medialo branch block Position: Prone   Start Time: 1220am End Time: 1231pm Fluoro Time: 46s  RN/CMA Victoria Euceda, CMA Bright, CMA    Time 11:50am 1235pm    BP 136/84 146/92    Pulse 66 65    Respirations 14 14    O2 Sat 93 93    S/S 6 6    Pain Level 6/10 4/10     D/C home with sister, patient A & O X 3, D/C instructions reviewed, and sits independently.

## 2017-11-10 ENCOUNTER — Ambulatory Visit: Payer: Medicare HMO | Admitting: Physical Medicine & Rehabilitation

## 2017-11-15 ENCOUNTER — Encounter: Payer: Medicare HMO | Attending: Physical Medicine & Rehabilitation

## 2017-11-15 ENCOUNTER — Encounter: Payer: Self-pay | Admitting: Physical Medicine & Rehabilitation

## 2017-11-15 ENCOUNTER — Ambulatory Visit: Payer: Medicare HMO | Admitting: Physical Medicine & Rehabilitation

## 2017-11-15 VITALS — BP 129/88 | HR 93 | Ht 67.0 in | Wt 156.4 lb

## 2017-11-15 DIAGNOSIS — M545 Low back pain: Secondary | ICD-10-CM | POA: Diagnosis not present

## 2017-11-15 DIAGNOSIS — M1612 Unilateral primary osteoarthritis, left hip: Secondary | ICD-10-CM

## 2017-11-15 DIAGNOSIS — Z9889 Other specified postprocedural states: Secondary | ICD-10-CM | POA: Diagnosis not present

## 2017-11-15 DIAGNOSIS — F17210 Nicotine dependence, cigarettes, uncomplicated: Secondary | ICD-10-CM | POA: Diagnosis not present

## 2017-11-15 DIAGNOSIS — K219 Gastro-esophageal reflux disease without esophagitis: Secondary | ICD-10-CM | POA: Insufficient documentation

## 2017-11-15 DIAGNOSIS — M47816 Spondylosis without myelopathy or radiculopathy, lumbar region: Secondary | ICD-10-CM | POA: Diagnosis not present

## 2017-11-15 DIAGNOSIS — I10 Essential (primary) hypertension: Secondary | ICD-10-CM | POA: Diagnosis not present

## 2017-11-15 DIAGNOSIS — Z5181 Encounter for therapeutic drug level monitoring: Secondary | ICD-10-CM | POA: Insufficient documentation

## 2017-11-15 DIAGNOSIS — G8929 Other chronic pain: Secondary | ICD-10-CM

## 2017-11-15 DIAGNOSIS — Z9049 Acquired absence of other specified parts of digestive tract: Secondary | ICD-10-CM | POA: Diagnosis not present

## 2017-11-15 DIAGNOSIS — Z90711 Acquired absence of uterus with remaining cervical stump: Secondary | ICD-10-CM | POA: Diagnosis not present

## 2017-11-15 NOTE — Progress Notes (Signed)
Subjective:    Patient ID: [name], female    DOB: [birth year], 52 y.o.   MRN: [id number]  HPI  CC Left hip pain Patient with known left hip arthritis, some relief with Left hip fluoro guided injection  Takes care of Mom, does not like to take tramadol due to drowsiness, only takes 1/2 tab twice a day On prednisone for sinus issues Having a lot of muscle pain last week Low back pain not helped by Lumbar L3-4 medial branch and L5 Dorsal ramus injections   Pain Inventory Average Pain 6 Pain Right Now 4 My pain is sharp, stabbing, tingling and aching  In the last 24 hours, has pain interfered with the following? General activity 5 Relation with others 5 Enjoyment of life 5 What TIME of day is your pain at its worst? all Sleep (in general) Poor  Pain is worse with: walking, bending, sitting and standing Pain improves with: medication Relief from Meds: 5  Mobility walk without assistance ability to climb steps?  yes do you drive?  yes  Function disabled: date disabled . I need assistance with the following:  meal prep, household duties and shopping  Neuro/Psych spasms  Prior Studies Any changes since last visit?  no  Physicians involved in your care Any changes since last visit?  no   No family history on file. Social History   Socioeconomic History  . Marital status: Widowed    Spouse name: Not on file  . Number of children: Not on file  . Years of education: Not on file  . Highest education level: Not on file  Occupational History  . Not on file  Social Needs  . Financial resource strain: Not on file  . Food insecurity:    Worry: Not on file    Inability: Not on file  . Transportation needs:    Medical: Not on file    Non-medical: Not on file  Tobacco Use  . Smoking status: Current Every Day Smoker    Packs/day: 0.50    Types: Cigarettes  . Smokeless tobacco: Never Used  Substance and Sexual Activity  . Alcohol use: No  . Drug use: No    . Sexual activity: Not on file  Lifestyle  . Physical activity:    Days per week: Not on file    Minutes per session: Not on file  . Stress: Not on file  Relationships  . Social connections:    Talks on phone: Not on file    Gets together: Not on file    Attends religious service: Not on file    Active member of club or organization: Not on file    Attends meetings of clubs or organizations: Not on file    Relationship status: Not on file  Other Topics Concern  . Not on file  Social History Narrative  . Not on file   Past Surgical History:  Procedure Laterality Date  . ABDOMINAL SURGERY    . APPENDECTOMY    . CHOLECYSTECTOMY    . COLONOSCOPY N/A 02/01/2014   2 SIMPLE ADNEOMA(HF), HYEPRPLASTIC RECTAL POLYPS  . DILATION AND CURETTAGE OF UTERUS    . NECK SURGERY    . PARTIAL HYSTERECTOMY     Past Medical History:  Diagnosis Date  . Degenerative disk disease   . GERD (gastroesophageal reflux disease)   . Hypertension    There were no vitals taken for this visit.  Opioid Risk Score:   Fall Risk Score:  `  1  Depression screen PHQ 2/9  Depression screen [facility] 2/9 02/17/2017 11/07/2015  Decreased Interest 2 2  Down, Depressed, Hopeless 1 1  PHQ - 2 Score 3 3  Altered sleeping - 3  Tired, decreased energy - 2  Change in appetite - 2  Feeling bad or failure about yourself  - 0  Trouble concentrating - 1  Moving slowly or fidgety/restless - 2  Suicidal thoughts - 0  PHQ-9 Score - 13     Review of Systems  Constitutional: Negative.   HENT: Negative.   Eyes: Negative.   Respiratory: Negative.   Cardiovascular: Negative.   Gastrointestinal: Negative.   Endocrine: Negative.   Genitourinary: Negative.   Musculoskeletal: Positive for arthralgias, back pain and myalgias.  Skin: Negative.   Allergic/Immunologic: Negative.   Neurological: Negative.   Hematological: Negative.   Psychiatric/Behavioral: Negative.   All other systems reviewed and are negative.       Objective:   Physical Exam  Constitutional: She is oriented to person, place, and time. She appears well-developed and well-nourished.  HENT:  Head: Normocephalic and atraumatic.  Eyes: Pupils are equal, round, and reactive to light. EOM are normal.  Neurological: She is alert and oriented to person, place, and time.  Skin: Skin is warm and dry.  Psychiatric: She has a normal mood and affect.  Nursing note and vitals reviewed. Left hip has mildly reduced internal/external rotation accompanied by pain. Right hip no pain with internal/external rotation full range of motion She can ambulate without assistive device no evidence of toe drag or knee instability She has no antalgic gait. Lumbar spine range of motion 75% flexion extension lateral bending and rotation Negative straight leg raising bilaterally Motor strength 5/5 bilateral hip flexor knee extension ankle dorsi flexion plantarflexion        Assessment & Plan:  1.  Chronic low back pain not relieved with bilateral L3-L4 medial branch and L5 dorsal ramus injection.  She has had prior good relief of these injections May consider sacroiliac injection. She continues on tramadol 1/2 tablet twice daily as needed she does not require new prescription at the current time  2.  Left hip osteoarthritis she feels like she is losing some range of motion of the hip.  We ordered some physical therapy she can only afford one co-pay per week therefore one visit per week recommended.  Patient very compliant she would likely do well with home exercise program in between PT visits

## 2017-12-07 ENCOUNTER — Encounter (HOSPITAL_COMMUNITY): Payer: Self-pay | Admitting: Physical Therapy

## 2017-12-07 ENCOUNTER — Ambulatory Visit (HOSPITAL_COMMUNITY): Payer: Medicare HMO | Attending: Physical Medicine & Rehabilitation | Admitting: Physical Therapy

## 2017-12-07 ENCOUNTER — Other Ambulatory Visit: Payer: Self-pay

## 2017-12-07 DIAGNOSIS — R29898 Other symptoms and signs involving the musculoskeletal system: Secondary | ICD-10-CM | POA: Diagnosis not present

## 2017-12-07 DIAGNOSIS — M25552 Pain in left hip: Secondary | ICD-10-CM | POA: Diagnosis not present

## 2017-12-07 DIAGNOSIS — G8929 Other chronic pain: Secondary | ICD-10-CM | POA: Diagnosis not present

## 2017-12-07 DIAGNOSIS — M545 Low back pain, unspecified: Secondary | ICD-10-CM
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
## 2017-12-13 ENCOUNTER — Encounter (HOSPITAL_COMMUNITY): Payer: Self-pay

## 2017-12-13 ENCOUNTER — Ambulatory Visit (HOSPITAL_COMMUNITY): Payer: Medicare HMO

## 2017-12-13 DIAGNOSIS — M545 Low back pain, unspecified: Secondary | ICD-10-CM

## 2017-12-13 DIAGNOSIS — G8929 Other chronic pain: Secondary | ICD-10-CM | POA: Diagnosis not present

## 2017-12-13 DIAGNOSIS — R29898 Other symptoms and signs involving the musculoskeletal system: Secondary | ICD-10-CM | POA: Diagnosis not present

## 2017-12-13 DIAGNOSIS — M25552 Pain in left hip: Secondary | ICD-10-CM
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
## 2017-12-13 NOTE — Patient Instructions (Signed)
Bridge [employer] small of back into mat, maintain pelvic tilt, roll up one vertebrae at a time. Focus on engaging posterior hip muscles. Hold for 5 breaths. Repeat 10-20 times, 1-2 times a day.    Copyright  VHI. All rights reserved.  Clam    Lie on side, legs bent 90. Place belt around knees.  Open top knee to ceiling, rotating leg outward.   2nd set: Touch toes to ankle of bottom leg. Close knees, rotating leg inward. Maintain hip position. Repeat 10 times x 5" holds. Repeat on other side. Do 1-2 sessions per day.  http://pm.exer.us/69   Copyright  VHI. All rights reserved.

## 2017-12-15 ENCOUNTER — Ambulatory Visit (HOSPITAL_COMMUNITY): Payer: Medicare HMO | Admitting: Physical Therapy

## 2017-12-15 ENCOUNTER — Telehealth (HOSPITAL_COMMUNITY): Payer: Self-pay | Admitting: Family Medicine

## 2017-12-15 NOTE — Telephone Encounter (Signed)
12/15/17  pt called to cx said she thinks she got that virus

## 2017-12-19 ENCOUNTER — Ambulatory Visit (HOSPITAL_COMMUNITY): Payer: Medicare HMO

## 2017-12-19 ENCOUNTER — Encounter (HOSPITAL_COMMUNITY): Payer: Self-pay

## 2017-12-19 DIAGNOSIS — M545 Low back pain: Secondary | ICD-10-CM

## 2017-12-19 DIAGNOSIS — M25552 Pain in left hip: Secondary | ICD-10-CM | POA: Diagnosis not present

## 2017-12-19 DIAGNOSIS — R29898 Other symptoms and signs involving the musculoskeletal system: Secondary | ICD-10-CM | POA: Diagnosis not present

## 2017-12-19 DIAGNOSIS — G8929 Other chronic pain: Secondary | ICD-10-CM
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
## 2017-12-22 ENCOUNTER — Ambulatory Visit (HOSPITAL_COMMUNITY): Payer: Medicare HMO

## 2017-12-22 DIAGNOSIS — R29898 Other symptoms and signs involving the musculoskeletal system: Secondary | ICD-10-CM | POA: Diagnosis not present

## 2017-12-22 DIAGNOSIS — M25552 Pain in left hip: Secondary | ICD-10-CM

## 2017-12-22 DIAGNOSIS — G8929 Other chronic pain: Secondary | ICD-10-CM | POA: Diagnosis not present

## 2017-12-22 DIAGNOSIS — M545 Low back pain: Secondary | ICD-10-CM

## 2017-12-22 NOTE — Therapy (Signed)
[place], [place], [address] Phone: [phone]   Fax:  [phone]  Physical Therapy Treatment  Patient Details  Name: [name] MRN: [id number] Date of Birth: 1965/07/17 Referring Provider (PT): Letta Pate Luanna Salk, MD   Encounter Date: 12/22/2017  PT End of Session - 12/22/17 1049    Visit Number  4    Number of Visits  13    Date for PT Re-Evaluation  01/18/18   Minireassess 12/29/2017   Authorization Type  [employer] H[employer]     Authorization Time Period  12/07/17 - 01/18/18    Authorization - Visit Number  4    Authorization - Number of Visits  10    PT Start Time  1324    PT Stop Time  1114    PT Time Calculation (min)  38 min    Activity Tolerance  Patient tolerated treatment well;No increased pain    Behavior During Therapy  WFL for tasks assessed/performed       Past Medical History:  Diagnosis Date  . Degenerative disk disease   . GERD (gastroesophageal reflux disease)   . Hypertension     Past Surgical History:  Procedure Laterality Date  . ABDOMINAL SURGERY    . APPENDECTOMY    . CHOLECYSTECTOMY    . COLONOSCOPY N/A 02/01/2014   2 SIMPLE ADNEOMA(HF), HYEPRPLASTIC RECTAL POLYPS  . DILATION AND CURETTAGE OF UTERUS    . NECK SURGERY    . PARTIAL HYSTERECTOMY      There were no vitals filed for this visit.  Subjective Assessment - 12/22/17 1048    Subjective  Pt stated she is feeling pretty good today, no reports of pain at entrance.      Patient Stated Goals  To improve her hip ROM    Currently in Pain?  No/denies         [facility] PT Assessment - 12/22/17 0001      Assessment   Medical Diagnosis  Primary Osteoarthritis of Left Hip     Referring Provider (PT)  Letta Pate, Luanna Salk, MD    Next MD Visit  Sometime in February    Prior Therapy  Yes, back, neck, shoulder      Palpation   Palpation comment  pain with palpation over Rt PSIS; no pain following MET                    [facility] Adult PT Treatment/Exercise - 12/22/17 0001      Knee/Hip Exercises: Aerobic   Tread Mill  3' elevation 3 degrees wiht ab set following MET at speed 1.8 mph      Knee/Hip Exercises: Standing   Forward Lunges  10 reps;3 seconds    Forward Lunges Limitations  4in step height    Hip Abduction  10 reps;Knee straight;Both    Hip Extension  10 reps;Knee straight;Both    Functional Squat  10 reps    Functional Squat Limitations  front of chair, cueing for mechanics    SLS  Rt 60:", Lt 33"    SLS with Vectors  3x 5" BLE      Knee/Hip Exercises: Supine   Bridges  2 sets;10 reps    Bridges Limitations  last set Rt LE closer to buttocks    Straight Leg Raises  Left;10 reps    Straight Leg Raises Limitations  .Lt only following MET      Manual Therapy   Manual Therapy  Muscle Energy Technique    Manual therapy comments  Manual complete separate than rest of tx    Muscle Energy Technique  MET for Rt SI anterior rotation f/b core strengthening and gait training wiht ab set               PT Short Term Goals - 12/07/17 1757      PT SHORT TERM GOAL #1   Title  Patient will demonstrate understanding and report regular compliance with HEP in order to improve strength, ROM, and overall functional mobility.     Time  3    Period  Weeks    Status  New    Target Date  12/29/17      PT SHORT TERM GOAL #2   Title  Patient will demonstrate improvement of 1/2 MMT strength grade in all deficient muscle groups to improve mechanics with ambulation and with transitions to and from a chair.     Time  3    Period  Weeks    Status  New    Target Date  12/29/17      PT SHORT TERM GOAL #3   Title  Patient will report that her pain has not exceeded a 5/10 over the course of a 1 week period indicating improved tolerance to daily activities.     Time  3    Period  Weeks    Status  New    Target Date  12/29/17        PT Long Term Goals - 12/07/17 1757       PT LONG TERM GOAL #1   Title  Patient will demonstrate improvement of 1 MMT strength grade in all deficient muscle groups to improve mechanics with ambulation and with transitions to and from a chair.     Time  6    Period  Weeks    Status  New    Target Date  01/19/18      PT LONG TERM GOAL #2   Title  Patient will report that her pain has not exceeded a 3/10 over the course of a 1 week period indicating improved tolerance to daily activities.     Time  6    Period  Weeks    Status  New    Target Date  01/19/18      PT LONG TERM GOAL #3   Title  Patient will demonstrate improvement of time on the 5xSTS test by 10 seconds indicating improved balance and functional mobility.     Time  6    Period  Weeks    Status  New    Target Date  01/19/18      PT LONG TERM GOAL #4   Title  Patient will demonstrate improvement of left hip internal and external rotation of at least 5 degrees in order to improve ease of putting on shoes and socks.     Time  6    Period  Weeks    Status  New    Target Date  01/19/18            Plan - 12/22/17 1110    Clinical Impression Statement  Checked SI alignment with pain with palpation to Rt PSIS and anterior rotation.  MET complete to address SI alignment with no LLD and no reports of pain with palpation.  Progressed core strength and additional CKC hip strengthing exercises.  No reports of pain through session.      Rehab  Potential  Good    Clinical Impairments Affecting Rehab Potential  Positive: motivated; Negative: chronicity of pain    PT Duration  6 weeks    PT Treatment/Interventions  ADLs/Self Care Home Management;Aquatic Therapy;Electrical Stimulation;DME Instruction;Gait training;Stair training;Functional mobility training;Therapeutic activities;Therapeutic exercise;Balance training;Neuromuscular re-education;Patient/family education;Orthotic Fit/Training;Manual techniques;Passive range of motion;Dry needling;Energy conservation;Taping    PT  Next Visit Plan  Check SI alignment next session with MET PRN.  Focus on gluteal strengthening and hip mobility.  Manual therapy for pain reduction consider soft tissue massage and distraction of left hip.   Aquatic therapy is approved with insurance with no authorization required.      PT Home Exercise Plan  12/13/2017: bridge, isometric clam and reverse clam.       Patient will benefit from skilled therapeutic intervention in order to improve the following deficits and impairments:  Abnormal gait, Decreased balance, Decreased mobility, Difficulty walking, Hypomobility, Decreased range of motion, Decreased activity tolerance, Decreased strength, Pain  Visit Diagnosis: Pain in left hip  Chronic low back pain, unspecified back pain laterality, unspecified whether sciatica present  Other symptoms and signs involving the musculoskeletal system     Problem List Patient Active Problem List   Diagnosis Date Noted  . Primary osteoarthritis of left hip 11/18/2015  . Chronic left hip pain 11/07/2015  . Spondylosis without myelopathy or radiculopathy, lumbar region 11/07/2015  . Special screening for malignant neoplasms, colon   . Left leg weakness 10/02/2012  . Unstable balance 10/02/2012  . IMPINGEMENT SYNDROME 07/28/2009  . SHOULDER STRAIN, LEFT 07/28/2009   Ihor Austin, [place]; [place]  Aldona Lento 12/22/2017, 11:21 AM  [place], [place], [address] Phone: [phone]   Fax:  [phone]  Name: VICENTA OLDS MRN: [id number] Date of Birth: [birth year]

## 2018-01-03 ENCOUNTER — Ambulatory Visit (HOSPITAL_COMMUNITY): Payer: Medicare HMO

## 2018-01-03 ENCOUNTER — Encounter (HOSPITAL_COMMUNITY): Payer: Self-pay

## 2018-01-03 DIAGNOSIS — R29898 Other symptoms and signs involving the musculoskeletal system: Secondary | ICD-10-CM | POA: Diagnosis not present

## 2018-01-03 DIAGNOSIS — G8929 Other chronic pain: Secondary | ICD-10-CM

## 2018-01-03 DIAGNOSIS — M545 Low back pain, unspecified: Secondary | ICD-10-CM

## 2018-01-03 DIAGNOSIS — M25552 Pain in left hip: Secondary | ICD-10-CM | POA: Diagnosis not present
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
## 2018-01-06 ENCOUNTER — Encounter (HOSPITAL_COMMUNITY): Payer: Self-pay | Admitting: Physical Therapy

## 2018-01-06 ENCOUNTER — Ambulatory Visit (HOSPITAL_COMMUNITY): Payer: Medicare HMO | Attending: Physical Medicine & Rehabilitation | Admitting: Physical Therapy

## 2018-01-06 DIAGNOSIS — G8929 Other chronic pain: Secondary | ICD-10-CM | POA: Insufficient documentation

## 2018-01-06 DIAGNOSIS — M545 Low back pain, unspecified: Secondary | ICD-10-CM

## 2018-01-06 DIAGNOSIS — M25552 Pain in left hip: Secondary | ICD-10-CM | POA: Insufficient documentation

## 2018-01-06 DIAGNOSIS — R29898 Other symptoms and signs involving the musculoskeletal system: Secondary | ICD-10-CM | POA: Diagnosis not present
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
## 2018-01-09 ENCOUNTER — Ambulatory Visit (HOSPITAL_COMMUNITY): Payer: Medicare HMO

## 2018-01-10 ENCOUNTER — Telehealth (HOSPITAL_COMMUNITY): Payer: Self-pay

## 2018-01-10 ENCOUNTER — Ambulatory Visit (HOSPITAL_COMMUNITY): Payer: Medicare HMO

## 2018-01-10 NOTE — Telephone Encounter (Signed)
Patient cancel for today she need to take care of sick nephew while her brother is having chemotherapy

## 2018-01-12 ENCOUNTER — Ambulatory Visit (HOSPITAL_COMMUNITY): Payer: Medicare HMO | Admitting: Physical Therapy

## 2018-01-12 ENCOUNTER — Encounter (HOSPITAL_COMMUNITY): Payer: Self-pay | Admitting: Physical Therapy

## 2018-01-12 DIAGNOSIS — G8929 Other chronic pain: Secondary | ICD-10-CM

## 2018-01-12 DIAGNOSIS — M545 Low back pain: Secondary | ICD-10-CM

## 2018-01-12 DIAGNOSIS — R29898 Other symptoms and signs involving the musculoskeletal system: Secondary | ICD-10-CM | POA: Diagnosis not present

## 2018-01-12 DIAGNOSIS — M25552 Pain in left hip: Secondary | ICD-10-CM
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
## 2018-01-16 ENCOUNTER — Ambulatory Visit (HOSPITAL_COMMUNITY): Payer: Medicare HMO

## 2018-01-17 ENCOUNTER — Encounter (HOSPITAL_COMMUNITY): Payer: Self-pay | Admitting: Physical Therapy

## 2018-01-17 ENCOUNTER — Ambulatory Visit (HOSPITAL_COMMUNITY): Payer: Medicare HMO | Admitting: Physical Therapy

## 2018-01-17 DIAGNOSIS — R29898 Other symptoms and signs involving the musculoskeletal system: Secondary | ICD-10-CM | POA: Diagnosis not present

## 2018-01-17 DIAGNOSIS — M25552 Pain in left hip: Secondary | ICD-10-CM | POA: Diagnosis not present

## 2018-01-17 DIAGNOSIS — M545 Low back pain, unspecified: Secondary | ICD-10-CM

## 2018-01-17 DIAGNOSIS — G8929 Other chronic pain: Secondary | ICD-10-CM
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
## 2018-01-19 ENCOUNTER — Ambulatory Visit: Payer: Medicare HMO | Admitting: Physical Medicine & Rehabilitation

## 2018-01-19 ENCOUNTER — Encounter: Payer: Medicare HMO | Attending: Physical Medicine & Rehabilitation

## 2018-01-19 DIAGNOSIS — Z9889 Other specified postprocedural states: Secondary | ICD-10-CM | POA: Insufficient documentation

## 2018-01-19 DIAGNOSIS — I10 Essential (primary) hypertension: Secondary | ICD-10-CM | POA: Insufficient documentation

## 2018-01-19 DIAGNOSIS — F17210 Nicotine dependence, cigarettes, uncomplicated: Secondary | ICD-10-CM | POA: Insufficient documentation

## 2018-01-19 DIAGNOSIS — K219 Gastro-esophageal reflux disease without esophagitis: Secondary | ICD-10-CM | POA: Insufficient documentation

## 2018-01-19 DIAGNOSIS — Z5181 Encounter for therapeutic drug level monitoring: Secondary | ICD-10-CM | POA: Insufficient documentation

## 2018-01-19 DIAGNOSIS — M47816 Spondylosis without myelopathy or radiculopathy, lumbar region: Secondary | ICD-10-CM | POA: Insufficient documentation

## 2018-01-19 DIAGNOSIS — M1612 Unilateral primary osteoarthritis, left hip: Secondary | ICD-10-CM | POA: Insufficient documentation

## 2018-01-19 DIAGNOSIS — Z9049 Acquired absence of other specified parts of digestive tract: Secondary | ICD-10-CM | POA: Insufficient documentation

## 2018-01-19 DIAGNOSIS — Z90711 Acquired absence of uterus with remaining cervical stump: Secondary | ICD-10-CM | POA: Insufficient documentation

## 2018-01-20 ENCOUNTER — Ambulatory Visit (HOSPITAL_COMMUNITY): Payer: Medicare HMO | Admitting: Physical Therapy

## 2018-01-20 ENCOUNTER — Encounter (HOSPITAL_COMMUNITY): Payer: Self-pay | Admitting: Physical Therapy

## 2018-01-20 DIAGNOSIS — R29898 Other symptoms and signs involving the musculoskeletal system: Secondary | ICD-10-CM

## 2018-01-20 DIAGNOSIS — G8929 Other chronic pain: Secondary | ICD-10-CM

## 2018-01-20 DIAGNOSIS — M545 Low back pain: Secondary | ICD-10-CM | POA: Diagnosis not present

## 2018-01-20 DIAGNOSIS — M25552 Pain in left hip: Secondary | ICD-10-CM | POA: Diagnosis not present
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
## 2018-01-20 NOTE — Patient Instructions (Signed)
EXTENSION: Standing     Stand, both feet flat. Draw right leg behind body. Progress to theraband as able Complete _2__ sets of _10__ repetitions. Perform __1_ sessions per day.  Copyright  VHI. All rights reserved.  ABDUCTION: Standing (Active)    Stand, feet flat. Lift right leg out to side. Progress to theraband as able Complete _2__ sets of _10__ repetitions. Perform __1_ sessions per day.  http://gtsc.exer.us/111   Copyright  VHI. All rights reserved.  Single Leg - Eyes Open    Holding support, lift right leg while maintaining balance over other leg. Progress to removing hands from support surface for longer periods of time. Hold__15__ seconds. Repeat __5__ times per session. Do _1___ sessions per day.  Copyright  VHI. All rights reserved.

## 2018-01-24 ENCOUNTER — Ambulatory Visit (HOSPITAL_COMMUNITY): Payer: Medicare HMO | Admitting: Physical Therapy

## 2018-01-26 ENCOUNTER — Encounter (HOSPITAL_COMMUNITY): Payer: Medicare HMO | Admitting: Physical Therapy

## 2018-02-06 DIAGNOSIS — R070 Pain in throat: Secondary | ICD-10-CM | POA: Diagnosis not present

## 2018-02-06 DIAGNOSIS — I10 Essential (primary) hypertension: Secondary | ICD-10-CM | POA: Diagnosis not present

## 2018-02-06 DIAGNOSIS — Z1389 Encounter for screening for other disorder: Secondary | ICD-10-CM | POA: Diagnosis not present

## 2018-02-06 DIAGNOSIS — R05 Cough: Secondary | ICD-10-CM | POA: Diagnosis not present

## 2018-02-06 DIAGNOSIS — Z6825 Body mass index (BMI) 25.0-25.9, adult: Secondary | ICD-10-CM | POA: Diagnosis not present

## 2018-02-06 DIAGNOSIS — H6691 Otitis media, unspecified, right ear: Secondary | ICD-10-CM | POA: Diagnosis not present

## 2018-02-06 DIAGNOSIS — J343 Hypertrophy of nasal turbinates: Secondary | ICD-10-CM | POA: Diagnosis not present

## 2018-02-13 DIAGNOSIS — J069 Acute upper respiratory infection, unspecified: Secondary | ICD-10-CM | POA: Diagnosis not present

## 2018-02-13 DIAGNOSIS — Z6825 Body mass index (BMI) 25.0-25.9, adult: Secondary | ICD-10-CM | POA: Diagnosis not present

## 2018-02-13 DIAGNOSIS — E663 Overweight: Secondary | ICD-10-CM | POA: Diagnosis not present

## 2018-02-14 ENCOUNTER — Encounter: Payer: Self-pay | Admitting: Physical Medicine & Rehabilitation

## 2018-02-14 ENCOUNTER — Encounter: Payer: Medicare HMO | Attending: Physical Medicine & Rehabilitation

## 2018-02-14 ENCOUNTER — Ambulatory Visit: Payer: Medicare HMO | Admitting: Physical Medicine & Rehabilitation

## 2018-02-14 VITALS — BP 134/84 | HR 81 | Ht 67.0 in | Wt 163.0 lb

## 2018-02-14 DIAGNOSIS — Z9889 Other specified postprocedural states: Secondary | ICD-10-CM | POA: Insufficient documentation

## 2018-02-14 DIAGNOSIS — M1612 Unilateral primary osteoarthritis, left hip: Secondary | ICD-10-CM | POA: Insufficient documentation

## 2018-02-14 DIAGNOSIS — F17210 Nicotine dependence, cigarettes, uncomplicated: Secondary | ICD-10-CM | POA: Insufficient documentation

## 2018-02-14 DIAGNOSIS — M47816 Spondylosis without myelopathy or radiculopathy, lumbar region: Secondary | ICD-10-CM | POA: Insufficient documentation

## 2018-02-14 DIAGNOSIS — Z5181 Encounter for therapeutic drug level monitoring: Secondary | ICD-10-CM | POA: Insufficient documentation

## 2018-02-14 DIAGNOSIS — K219 Gastro-esophageal reflux disease without esophagitis: Secondary | ICD-10-CM | POA: Insufficient documentation

## 2018-02-14 DIAGNOSIS — Z9049 Acquired absence of other specified parts of digestive tract: Secondary | ICD-10-CM | POA: Diagnosis not present

## 2018-02-14 DIAGNOSIS — Z90711 Acquired absence of uterus with remaining cervical stump: Secondary | ICD-10-CM | POA: Diagnosis not present

## 2018-02-14 DIAGNOSIS — I10 Essential (primary) hypertension: Secondary | ICD-10-CM | POA: Insufficient documentation

## 2018-02-14 NOTE — Progress Notes (Signed)
Subjective:    Patient ID: [name], female    DOB: 1965/09/18, 53 y.o.   MRN: [id number]  HPI Pain doing better in low back and in Left hip  Has been doing outpatient PT  Last tramadol ~2wks ago, still has 40 left from Rx in Dec  Diabetes under better control using insulin pump    Pain Inventory Average Pain 7 Pain Right Now 3 My pain is constant, sharp and aching  In the last 24 hours, has pain interfered with the following? General activity 5 Relation with others 5 Enjoyment of life 5 What TIME of day is your pain at its worst? all Sleep (in general) Good  Pain is worse with: unsure Pain improves with: therapy/exercise and medication Relief from Meds: 5  Mobility walk without assistance ability to climb steps?  yes do you drive?  yes  Function disabled: date disabled 2008  Neuro/Psych numbness tremor tingling dizziness  Prior Studies Any changes since last visit?  no  Physicians involved in your care Any changes since last visit?  no   No family history on file. Social History   Socioeconomic History  . Marital status: Widowed    Spouse name: Not on file  . Number of children: Not on file  . Years of education: Not on file  . Highest education level: Not on file  Occupational History  . Not on file  Social Needs  . Financial resource strain: Not on file  . Food insecurity:    Worry: Not on file    Inability: Not on file  . Transportation needs:    Medical: Not on file    Non-medical: Not on file  Tobacco Use  . Smoking status: Current Every Day Smoker    Packs/day: 0.50    Types: Cigarettes  . Smokeless tobacco: Never Used  Substance and Sexual Activity  . Alcohol use: No  . Drug use: No  . Sexual activity: Not on file  Lifestyle  . Physical activity:    Days per week: Not on file    Minutes per session: Not on file  . Stress: Not on file  Relationships  . Social connections:    Talks on phone: Not on file    Gets  together: Not on file    Attends religious service: Not on file    Active member of club or organization: Not on file    Attends meetings of clubs or organizations: Not on file    Relationship status: Not on file  Other Topics Concern  . Not on file  Social History Narrative  . Not on file   Past Surgical History:  Procedure Laterality Date  . ABDOMINAL SURGERY    . APPENDECTOMY    . CHOLECYSTECTOMY    . COLONOSCOPY N/A 02/01/2014   2 SIMPLE ADNEOMA(HF), HYEPRPLASTIC RECTAL POLYPS  . DILATION AND CURETTAGE OF UTERUS    . NECK SURGERY    . PARTIAL HYSTERECTOMY     Past Medical History:  Diagnosis Date  . Degenerative disk disease   . GERD (gastroesophageal reflux disease)   . Hypertension    BP 134/84   Pulse 81   Ht 5\' 7"  (1.702 m)   Wt 163 lb (73.9 kg)   SpO2 93%   BMI 25.53 kg/m   Opioid Risk Score:   Fall Risk Score:  `1  Depression screen PHQ 2/9  Depression screen [facility] 2/9 02/17/2017 11/07/2015  Decreased Interest 2 2  Down, Depressed, Hopeless  1 1  PHQ - 2 Score 3 3  Altered sleeping - 3  Tired, decreased energy - 2  Change in appetite - 2  Feeling bad or failure about yourself  - 0  Trouble concentrating - 1  Moving slowly or fidgety/restless - 2  Suicidal thoughts - 0  PHQ-9 Score - 13     Review of Systems  Constitutional: Negative.   HENT: Positive for sinus pressure and sneezing.   Eyes: Negative.   Respiratory: Positive for wheezing.   Cardiovascular: Negative.   Gastrointestinal: Negative.   Endocrine: Negative.   Genitourinary: Negative.   Musculoskeletal: Positive for arthralgias, back pain and myalgias.  Skin: Negative.   Allergic/Immunologic: Negative.   Neurological: Negative.   Hematological: Negative.   Psychiatric/Behavioral: Negative.   All other systems reviewed and are negative.      Objective:   Physical Exam Vitals signs and nursing note reviewed.  Constitutional:      Appearance: Normal appearance.  HENT:     Head:  Normocephalic and atraumatic.     Nose: Nose normal.  Eyes:     Extraocular Movements: Extraocular movements intact.     Pupils: Pupils are equal, round, and reactive to light.  Musculoskeletal:     Right hip: Normal.     Left hip: She exhibits decreased range of motion. She exhibits normal strength and no tenderness.     Lumbar back: She exhibits normal range of motion and no tenderness.  Skin:    General: Skin is warm and dry.  Neurological:     General: No focal deficit present.     Mental Status: She is alert and oriented to person, place, and time.  Psychiatric:        Mood and Affect: Mood normal.        Behavior: Behavior normal.    Left Hip lacks about 25% ROM for int rotation      Assessment & Plan:  Hx of Left hip OA doing better after PT- using minmal tramadol (1 in last 2 weeks)  No need for re injection ( last performed in July 2019)  Hx of lumbar spondylosis without myelopathy- improved after PT, nLast MBB Sept 2019  RTC 87m

## 2018-02-14 NOTE — Patient Instructions (Signed)
Please Keep up with therapy exercises and gym

## 2018-06-26 ENCOUNTER — Ambulatory Visit (HOSPITAL_COMMUNITY)
Admission: RE | Admit: 2018-06-26 | Discharge: 2018-06-26 | Disposition: A | Discharge status: Home / Self Care | Payer: Medicare HMO | Source: Ambulatory Visit | Attending: Family Medicine | Admitting: Family Medicine

## 2018-06-26 ENCOUNTER — Other Ambulatory Visit (HOSPITAL_COMMUNITY): Payer: Self-pay | Admitting: Family Medicine

## 2018-06-26 ENCOUNTER — Other Ambulatory Visit: Payer: Self-pay

## 2018-06-26 DIAGNOSIS — Z6825 Body mass index (BMI) 25.0-25.9, adult: Secondary | ICD-10-CM | POA: Diagnosis not present

## 2018-06-26 DIAGNOSIS — Z1389 Encounter for screening for other disorder: Secondary | ICD-10-CM | POA: Diagnosis not present

## 2018-06-26 DIAGNOSIS — M25572 Pain in left ankle and joints of left foot: Secondary | ICD-10-CM | POA: Insufficient documentation

## 2018-06-26 DIAGNOSIS — E663 Overweight: Secondary | ICD-10-CM | POA: Diagnosis not present

## 2018-06-26 DIAGNOSIS — S93402A Sprain of unspecified ligament of left ankle, initial encounter: Secondary | ICD-10-CM | POA: Diagnosis not present

## 2018-06-26 DIAGNOSIS — M7989 Other specified soft tissue disorders: Secondary | ICD-10-CM | POA: Diagnosis not present

## 2018-08-15 ENCOUNTER — Ambulatory Visit: Payer: Medicare HMO | Admitting: Physical Medicine & Rehabilitation

## 2018-08-21 DIAGNOSIS — M5431 Sciatica, right side: Secondary | ICD-10-CM | POA: Diagnosis not present

## 2018-08-24 ENCOUNTER — Encounter: Payer: Medicare HMO | Attending: Physical Medicine & Rehabilitation | Admitting: Physical Medicine & Rehabilitation

## 2018-08-24 ENCOUNTER — Encounter: Payer: Self-pay | Admitting: Physical Medicine & Rehabilitation

## 2018-08-24 ENCOUNTER — Other Ambulatory Visit: Payer: Self-pay

## 2018-08-24 VITALS — BP 115/81 | HR 62 | Temp 98.0°F | Ht 67.0 in | Wt 159.0 lb

## 2018-08-24 DIAGNOSIS — Z5181 Encounter for therapeutic drug level monitoring: Secondary | ICD-10-CM | POA: Diagnosis not present

## 2018-08-24 DIAGNOSIS — Z79899 Other long term (current) drug therapy: Secondary | ICD-10-CM | POA: Insufficient documentation

## 2018-08-24 DIAGNOSIS — M47816 Spondylosis without myelopathy or radiculopathy, lumbar region: Secondary | ICD-10-CM | POA: Diagnosis not present

## 2018-08-24 NOTE — Patient Instructions (Signed)
PT will call you if not mcuh next visit may need to repeat lumbar spine injections

## 2018-08-24 NOTE — Progress Notes (Signed)
Subjective:    Patient ID: [name], female    DOB: [birth year], 53 y.o.   MRN: [id number]  HPI  53 year old female with history of chronic left hip pain secondary to osteoarthritis as well as lumbar spondylosis without myelopathy who has had chronic pain.  She has undergone left hip intra-articular injection August 18, 2017 as well as bilateral L3-L4 medial branch L5 dorsal ramus blocks on 09/29/2017. Still has some tramadol, had not taken for the last few months after PT but her back started to bother her  Pt has been walking up to 18K steps in a day  She has started taking her tramadol again over the last 2-3 weeks.  Pain Inventory Average Pain 9 Pain Right Now 7 My pain is burning, tingling and aching  In the last 24 hours, has pain interfered with the following? General activity 6 Relation with others 5 Enjoyment of life 8 What TIME of day is your pain at its worst? . Sleep (in general) Poor  Pain is worse with: walking, bending, sitting, inactivity and standing Pain improves with: medication Relief from Meds: 3  Mobility walk with assistance ability to climb steps?  yes do you drive?  yes  Function disabled: date disabled . I need assistance with the following:  meal prep, household duties and shopping  Neuro/Psych trouble walking  Prior Studies Any changes since last visit?  no  Physicians involved in your care Any changes since last visit?  no   No family history on file. Social History   Socioeconomic History   Marital status: Widowed    Spouse name: Not on file   Number of children: Not on file   Years of education: Not on file   Highest education level: Not on file  Occupational History   Not on file  Social Needs   Financial resource strain: Not on file   Food insecurity    Worry: Not on file    Inability: Not on file   Transportation needs    Medical: Not on file    Non-medical: Not on file  Tobacco Use   Smoking status:  Current Every Day Smoker    Packs/day: 0.50    Types: Cigarettes   Smokeless tobacco: Never Used  Substance and Sexual Activity   Alcohol use: No   Drug use: No   Sexual activity: Not on file  Lifestyle   Physical activity    Days per week: Not on file    Minutes per session: Not on file   Stress: Not on file  Relationships   Social connections    Talks on phone: Not on file    Gets together: Not on file    Attends religious service: Not on file    Active member of club or organization: Not on file    Attends meetings of clubs or organizations: Not on file    Relationship status: Not on file  Other Topics Concern   Not on file  Social History Narrative   Not on file   Past Surgical History:  Procedure Laterality Date   ABDOMINAL SURGERY     APPENDECTOMY     CHOLECYSTECTOMY     COLONOSCOPY N/A 02/01/2014   2 SIMPLE ADNEOMA(HF), HYEPRPLASTIC RECTAL POLYPS   DILATION AND CURETTAGE OF UTERUS     NECK SURGERY     PARTIAL HYSTERECTOMY     Past Medical History:  Diagnosis Date   Degenerative disk disease    GERD (gastroesophageal reflux  disease)    Hypertension    BP 115/81    Pulse 62    Temp 98 F (36.7 C)    Ht 5\' 7"  (1.702 m)    Wt 159 lb (72.1 kg)    SpO2 95%    BMI 24.90 kg/m   Opioid Risk Score:   Fall Risk Score:  `1  Depression screen PHQ 2/9  Depression screen [facility] 2/9 02/17/2017 11/07/2015  Decreased Interest 2 2  Down, Depressed, Hopeless 1 1  PHQ - 2 Score 3 3  Altered sleeping - 3  Tired, decreased energy - 2  Change in appetite - 2  Feeling bad or failure about yourself  - 0  Trouble concentrating - 1  Moving slowly or fidgety/restless - 2  Suicidal thoughts - 0  PHQ-9 Score - 13    Review of Systems  Constitutional: Negative.   HENT: Negative.   Eyes: Negative.   Respiratory: Negative.   Cardiovascular: Negative.   Gastrointestinal: Negative.   Endocrine: Negative.   Genitourinary: Negative.   Musculoskeletal: Positive  for arthralgias, back pain, gait problem and myalgias.  Skin: Negative.   Allergic/Immunologic: Negative.   Hematological: Negative.   Psychiatric/Behavioral: Negative.   All other systems reviewed and are negative.      Objective:   Physical Exam Vitals signs and nursing note reviewed.  Constitutional:      Appearance: Normal appearance.  Eyes:     Extraocular Movements: Extraocular movements intact.     Conjunctiva/sclera: Conjunctivae normal.     Pupils: Pupils are equal, round, and reactive to light.  Musculoskeletal:     Left hip: She exhibits decreased range of motion.     Comments: There is mild pain with internal/external rotation of the left hip.  Lumbar spine has mild pain with forward flexion greater than with extension no evidence of postural deformities  Neurological:     General: No focal deficit present.     Mental Status: She is alert and oriented to person, place, and time.     Comments: Motor strength is 5/5 bilateral hip flexor knee extensor ankle dorsiflexor Negative straight leg raising test Gait without evidence of toe drag or knee instability  Psychiatric:        Mood and Affect: Mood normal.        Behavior: Behavior normal.           Assessment & Plan:  1.  Chronic hip osteoarthritis with some increased pain.  We discussed treatment options including resuming physical therapy as well as repeating intra-articular injection.  She would like to start with physical therapy.  2.  Lumbar spondylosis without myelopathy with increasing low back pain.  No imaging studies are needed at the current time.  We discussed that physical therapy can address this issue and if no improvement consider repeat bilateral L3-L4 medial branch and L5 dorsal ramus injections. The patient already has some tramadol from a prescription dating from July 2019.  She takes this rather sparingly, her last dose was about 2 days ago  Return to clinic in 6 weeks

## 2018-09-05 ENCOUNTER — Other Ambulatory Visit: Payer: Self-pay

## 2018-09-05 ENCOUNTER — Ambulatory Visit (HOSPITAL_COMMUNITY): Payer: Medicare HMO | Attending: Physical Medicine & Rehabilitation | Admitting: Physical Therapy

## 2018-09-05 ENCOUNTER — Encounter (HOSPITAL_COMMUNITY): Payer: Self-pay | Admitting: Physical Therapy

## 2018-09-05 DIAGNOSIS — G8929 Other chronic pain: Secondary | ICD-10-CM | POA: Diagnosis not present

## 2018-09-05 DIAGNOSIS — R29898 Other symptoms and signs involving the musculoskeletal system: Secondary | ICD-10-CM | POA: Insufficient documentation

## 2018-09-05 DIAGNOSIS — M25552 Pain in left hip: Secondary | ICD-10-CM | POA: Insufficient documentation

## 2018-09-05 DIAGNOSIS — M6281 Muscle weakness (generalized): Secondary | ICD-10-CM | POA: Diagnosis not present

## 2018-09-05 DIAGNOSIS — M545 Low back pain: Secondary | ICD-10-CM | POA: Diagnosis not present

## 2018-09-05 NOTE — Patient Instructions (Signed)
Double Knee to Chest (Flexion)    Gently pull both knees toward chest. Feel stretch in lower back or buttock area. Breathing deeply, Hold _30___ seconds. Repeat __3__ times. Do __1-2__ sessions per day.  http://[email or web address].exer.us/228   Copyright  VHI. All rights reserved.   
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
## 2018-09-07 ENCOUNTER — Encounter (HOSPITAL_COMMUNITY): Payer: Self-pay

## 2018-09-07 ENCOUNTER — Ambulatory Visit (HOSPITAL_COMMUNITY): Payer: Medicare HMO

## 2018-09-07 ENCOUNTER — Other Ambulatory Visit: Payer: Self-pay

## 2018-09-07 DIAGNOSIS — R29898 Other symptoms and signs involving the musculoskeletal system: Secondary | ICD-10-CM

## 2018-09-07 DIAGNOSIS — M6281 Muscle weakness (generalized): Secondary | ICD-10-CM | POA: Diagnosis not present

## 2018-09-07 DIAGNOSIS — M545 Low back pain, unspecified: Secondary | ICD-10-CM

## 2018-09-07 DIAGNOSIS — G8929 Other chronic pain: Secondary | ICD-10-CM | POA: Diagnosis not present

## 2018-09-07 DIAGNOSIS — M25552 Pain in left hip: Secondary | ICD-10-CM | POA: Diagnosis not present

## 2018-09-07 NOTE — Therapy (Signed)
[place], [place], [address] Phone: [phone]   Fax:  [phone]  Physical Therapy Treatment  Patient Details  Name: [name] MRN: [id number] Date of Birth: Jun 12, 1965 Referring Provider (PT): Alysia Penna, MD   Encounter Date: 09/07/2018  PT End of Session - 09/07/18 1320    Visit Number  2    Number of Visits  8    Date for PT Re-Evaluation  10/03/18    Authorization Type  [employer] H[employer]    Authorization Time Period  09/05/18- 10/03/18    Authorization - Visit Number  2    Authorization - Number of Visits  10    PT Start Time  0827    PT Stop Time  0910    PT Time Calculation (min)  43 min    Activity Tolerance  Patient tolerated treatment well    Behavior During Therapy  [facility] for tasks assessed/performed       Past Medical History:  Diagnosis Date  . Degenerative disk disease   . GERD (gastroesophageal reflux disease)   . Hypertension     Past Surgical History:  Procedure Laterality Date  . ABDOMINAL SURGERY    . APPENDECTOMY    . CHOLECYSTECTOMY    . COLONOSCOPY N/A 02/01/2014   2 SIMPLE ADNEOMA(HF), HYEPRPLASTIC RECTAL POLYPS  . DILATION AND CURETTAGE OF UTERUS    . NECK SURGERY    . PARTIAL HYSTERECTOMY      There were no vitals filed for this visit.  Subjective Assessment - 09/07/18 1315    Subjective  Pt reports she feels a cramp/spasm intermittently Rt side of lower back, current pain scale 5/10.  Reports she has began HEP without questions.    Currently in Pain?  Yes    Pain Score  5     Pain Location  Back    Pain Orientation  Lower;Right    Pain Descriptors / Indicators  Aching;Cramping;Spasm    Pain Type  Chronic pain    Pain Radiating Towards  No radicular symptoms this session, has c/o Lt LE all to the way to the foot    Pain Onset  More than a month ago    Pain Frequency  Constant    Aggravating Factors   Standing and walking    Pain Relieving Factors   prednisone    Effect of Pain on Daily Activities  moderately affects                       [facility] Adult PT Treatment/Exercise - 09/07/18 0001      Exercises   Exercises  Lumbar      Lumbar Exercises: Stretches   Active Hamstring Stretch  2 reps;30 seconds    Active Hamstring Stretch Limitations  supine, no reports of pain on Lt LE    Single Knee to Chest Stretch  2 reps;30 seconds    Double Knee to Chest Stretch  3 reps;30 seconds    Lower Trunk Rotation  5 reps;10 seconds    Standing Extension  10 reps;5 seconds    Prone on Elbows Stretch  2 reps;30 seconds      Lumbar Exercises: Supine   Ab Set  10 reps;3 seconds    AB Set Limitations  paired wiht breathing    Bent Knee Raise  10 reps;3 seconds    Bent Knee Raise Limitations  with ab set    Bridge  10  reps;3 seconds      Lumbar Exercises: Sidelying   Clam  10 reps;Both;3 seconds    Clam Limitations  GTB             PT Education - 09/07/18 1320    Education Details  Reviewed goals, assured compliance with HEP.  Pt able to demonstrate good form and appropriate hold time with HEP.  Discussed bed mobility to reduce strain on LB    Person(s) Educated  Patient    Methods  Explanation;Demonstration    Comprehension  Verbalized understanding       PT Short Term Goals - 09/05/18 1549      PT SHORT TERM GOAL #1   Title  Patient will demonstrate understanding and report regular compliance with HEP in order to improve strength, ROM, and overall functional mobility.     Time  2    Period  Weeks    Status  New    Target Date  09/19/18        PT Long Term Goals - 09/05/18 1550      PT LONG TERM GOAL #1   Title  Patient will demonstrate improvement of 5/5 MMT strength grade in all deficient muscle groups to improve mechanics with ambulation and with transitions to and from a chair.    Time  4    Period  Weeks    Status  New    Target Date  10/03/18      PT LONG TERM GOAL #2   Title  Patient will  report that her pain has not exceeded a 3/10 over the course of a 1 week period indicating improved tolerance to daily activities.     Time  4    Period  Weeks    Status  New    Target Date  10/03/18      PT LONG TERM GOAL #3   Title  Patient will demonstrate improvement of at least 10% on FOTO indicatining improved perceieved fucntional mobility relating to her back.    Time  4    Period  Weeks    Status  New    Target Date  10/03/18            Plan - 09/07/18 1321    Clinical Impression Statement  Reviewed goals, assured compliance with HEP, pt able to demonstrate and verbalize good from and appropriate hold time with HEP.  Session focus on lumbar mobility and core strengthening for lumbar stability.  Began with abdominal sets paired with breathing to assure no hold breath, min cueing to activate appropriate musculature.  Pt able to complete whole session with good form and no reports of pain through session.    Personal Factors and Comorbidities  Comorbidity 1    Comorbidities  arthritis    Examination-Activity Limitations  Stand;Sit    Examination-Participation Restrictions  Community Activity    Stability/Clinical Decision Making  Stable/Uncomplicated    Clinical Decision Making  Low    Rehab Potential  Good    PT Frequency  2x / week    PT Duration  4 weeks    PT Treatment/Interventions  ADLs/Self Care Home Management;Electrical Stimulation;Moist Heat;Traction;DME Instruction;Gait training;Stair training;Functional mobility training;Therapeutic activities;Therapeutic exercise;Balance training;Neuromuscular re-education;Patient/family education;Orthotic Fit/Training;Manual techniques;Passive range of motion;Dry needling;Energy conservation;Taping    PT Next Visit Plan  Add to LTR, [place] and bridges to next HEP.  Continue wiht lumbar stabilization and stretches       Patient will benefit from skilled therapeutic intervention in  order to improve the following deficits and  impairments:  Pain, Decreased mobility, Decreased range of motion, Decreased strength, Hypomobility  Visit Diagnosis: Chronic low back pain, unspecified back pain laterality, unspecified whether sciatica present  Muscle weakness (generalized)  Other symptoms and signs involving the musculoskeletal system     Problem List Patient Active Problem List   Diagnosis Date Noted  . Primary osteoarthritis of left hip 11/18/2015  . Chronic left hip pain 11/07/2015  . Spondylosis without myelopathy or radiculopathy, lumbar region 11/07/2015  . Special screening for malignant neoplasms, colon   . Left leg weakness 10/02/2012  . Unstable balance 10/02/2012  . IMPINGEMENT SYNDROME 07/28/2009  . SHOULDER STRAIN, LEFT 07/28/2009   Ihor Austin, [place]; [place]  Aldona Lento 09/07/2018, 1:23 PM  [place] [address] [place], [place], [address] Phone: [phone]   Fax:  [phone]  Name: [name] MRN: [id number] Date of Birth: 04-30-1965

## 2018-09-12 ENCOUNTER — Ambulatory Visit (HOSPITAL_COMMUNITY): Payer: Medicare HMO | Admitting: Physical Therapy

## 2018-09-14 ENCOUNTER — Other Ambulatory Visit: Payer: Self-pay

## 2018-09-14 ENCOUNTER — Encounter (HOSPITAL_COMMUNITY): Payer: Self-pay | Admitting: Physical Therapy

## 2018-09-14 ENCOUNTER — Ambulatory Visit (HOSPITAL_COMMUNITY): Payer: Medicare HMO | Admitting: Physical Therapy

## 2018-09-14 DIAGNOSIS — G8929 Other chronic pain: Secondary | ICD-10-CM | POA: Diagnosis not present

## 2018-09-14 DIAGNOSIS — M545 Low back pain: Secondary | ICD-10-CM | POA: Diagnosis not present

## 2018-09-14 DIAGNOSIS — R29898 Other symptoms and signs involving the musculoskeletal system: Secondary | ICD-10-CM | POA: Diagnosis not present

## 2018-09-14 DIAGNOSIS — M6281 Muscle weakness (generalized): Secondary | ICD-10-CM

## 2018-09-14 DIAGNOSIS — M25552 Pain in left hip: Secondary | ICD-10-CM | POA: Diagnosis not present

## 2018-09-14 NOTE — Therapy (Signed)
[place], [place], [address] Phone: [phone]   Fax:  [phone]  Physical Therapy Treatment  Patient Details  Name: [name] MRN: [id number] Date of Birth: [birth year] Referring Provider (PT): Alysia Penna, MD   Encounter Date: 09/14/2018  PT End of Session - 09/14/18 0831    Visit Number  3    Number of Visits  8    Date for PT Re-Evaluation  10/03/18    Authorization Type  [employer] H[employer]    Authorization Time Period  09/05/18- 10/03/18    Authorization - Visit Number  3    Authorization - Number of Visits  10    PT Start Time  0815    PT Stop Time  [id number]    PT Time Calculation (min)  42 min    Activity Tolerance  Patient tolerated treatment well    Behavior During Therapy  [facility] for tasks assessed/performed       Past Medical History:  Diagnosis Date  . Degenerative disk disease   . GERD (gastroesophageal reflux disease)   . Hypertension     Past Surgical History:  Procedure Laterality Date  . ABDOMINAL SURGERY    . APPENDECTOMY    . CHOLECYSTECTOMY    . COLONOSCOPY N/A 02/01/2014   2 SIMPLE ADNEOMA(HF), HYEPRPLASTIC RECTAL POLYPS  . DILATION AND CURETTAGE OF UTERUS    . NECK SURGERY    . PARTIAL HYSTERECTOMY      There were no vitals filed for this visit.  Subjective Assessment - 09/14/18 0819    Subjective  Patient reported that her back pain is bothering her today. She reported that she would rate it as a 7/10.    Currently in Pain?  Yes    Pain Score  7     Pain Orientation  Lower    Pain Descriptors / Indicators  Aching    Pain Type  Chronic pain    Pain Radiating Towards  Both hips hurt today    Pain Onset  More than a month ago                       [facility] Adult PT Treatment/Exercise - 09/14/18 0001      Exercises   Exercises  Lumbar      Lumbar Exercises: Stretches   Active Hamstring Stretch  2 reps;30 seconds    Active Hamstring Stretch  Limitations  supine, no reports of pain on Lt LE    Single Knee to Chest Stretch  2 reps;30 seconds    Double Knee to Chest Stretch  3 reps;30 seconds    Lower Trunk Rotation  5 reps;10 seconds    Standing Extension  10 reps;5 seconds    Prone on Elbows Stretch  2 reps;30 seconds      Lumbar Exercises: Supine   Ab Set  10 reps;3 seconds    AB Set Limitations  paired with breathing    Bent Knee Raise  3 seconds;20 reps    Bent Knee Raise Limitations  with ab set. Alternating sides.     Bridge  10 reps;3 seconds      Lumbar Exercises: Sidelying   Clam  10 reps;Both;3 seconds    Clam Limitations  GTB      Manual Therapy   Manual Therapy  Soft tissue mobilization    Manual therapy comments  Manual complete separate than rest of tx    Soft  tissue mobilization  Prone with soft bolster under bilateral ankles. STM to bilateral lumbar paraspinals Lt>Rt               PT Short Term Goals - 09/05/18 1549      PT SHORT TERM GOAL #1   Title  Patient will demonstrate understanding and report regular compliance with HEP in order to improve strength, ROM, and overall functional mobility.     Time  2    Period  Weeks    Status  New    Target Date  09/19/18        PT Long Term Goals - 09/05/18 1550      PT LONG TERM GOAL #1   Title  Patient will demonstrate improvement of 5/5 MMT strength grade in all deficient muscle groups to improve mechanics with ambulation and with transitions to and from a chair.    Time  4    Period  Weeks    Status  New    Target Date  10/03/18      PT LONG TERM GOAL #2   Title  Patient will report that her pain has not exceeded a 3/10 over the course of a 1 week period indicating improved tolerance to daily activities.     Time  4    Period  Weeks    Status  New    Target Date  10/03/18      PT LONG TERM GOAL #3   Title  Patient will demonstrate improvement of at least 10% on FOTO indicatining improved perceieved fucntional mobility relating to her  back.    Time  4    Period  Weeks    Status  New    Target Date  10/03/18            Plan - 09/14/18 [id number]    Clinical Impression Statement  Continued with established plan of care this session. Patient reported improved symptoms following more extension based exercises this session. Plan to continue with a greater focus on lumbar extension based exercises next session. Ended session with soft tissue mobilization to the left side more than the right. Patient reported that she felt her pain decreased following manual therapy. Patient would benefit from continued skilled physical therapy to continue progressing towards functional goals.    Personal Factors and Comorbidities  Comorbidity 1    Comorbidities  arthritis    Examination-Activity Limitations  Stand;Sit    Examination-Participation Restrictions  Community Activity    Stability/Clinical Decision Making  Stable/Uncomplicated    Rehab Potential  Good    PT Frequency  2x / week    PT Duration  4 weeks    PT Treatment/Interventions  ADLs/Self Care Home Management;Electrical Stimulation;Moist Heat;Traction;DME Instruction;Gait training;Stair training;Functional mobility training;Therapeutic activities;Therapeutic exercise;Balance training;Neuromuscular re-education;Patient/family education;Orthotic Fit/Training;Manual techniques;Passive range of motion;Dry needling;Energy conservation;Taping    PT Next Visit Plan  Add to LTR, Ola and bridges to next HEP.  Continue wiht lumbar stabilization and stretches. Focus on lumbar extension exercises add Pallof press.       Patient will benefit from skilled therapeutic intervention in order to improve the following deficits and impairments:  Pain, Decreased mobility, Decreased range of motion, Decreased strength, Hypomobility  Visit Diagnosis: No diagnosis found.     Problem List Patient Active Problem List   Diagnosis Date Noted  . Primary osteoarthritis of left hip 11/18/2015  .  Chronic left hip pain 11/07/2015  . Spondylosis without myelopathy or radiculopathy, lumbar region 11/07/2015  .  Special screening for malignant neoplasms, colon   . Left leg weakness 10/02/2012  . Unstable balance 10/02/2012  . IMPINGEMENT SYNDROME 07/28/2009  . SHOULDER STRAIN, LEFT 07/28/2009   Clarene Critchley PT, DPT 9:29 AM, 09/14/18 [place], [place], [address] Phone: [phone]   Fax:  [phone]  Name: DOVEY WILLNER MRN: [id number] Date of Birth: 01/18/1965

## 2018-09-19 ENCOUNTER — Encounter (HOSPITAL_COMMUNITY): Payer: Self-pay

## 2018-09-19 ENCOUNTER — Ambulatory Visit (HOSPITAL_COMMUNITY): Payer: Medicare HMO

## 2018-09-19 ENCOUNTER — Other Ambulatory Visit: Payer: Self-pay

## 2018-09-19 DIAGNOSIS — G8929 Other chronic pain: Secondary | ICD-10-CM

## 2018-09-19 DIAGNOSIS — M6281 Muscle weakness (generalized): Secondary | ICD-10-CM

## 2018-09-19 DIAGNOSIS — M545 Low back pain, unspecified: Secondary | ICD-10-CM

## 2018-09-19 DIAGNOSIS — M25552 Pain in left hip: Secondary | ICD-10-CM | POA: Diagnosis not present

## 2018-09-19 DIAGNOSIS — R29898 Other symptoms and signs involving the musculoskeletal system: Secondary | ICD-10-CM | POA: Diagnosis not present

## 2018-09-19 NOTE — Therapy (Signed)
[place], [place], [address] Phone: [phone]   Fax:  [phone]  Physical Therapy Treatment  Patient Details  Name: [name] MRN: [id number] Date of Birth: [birth year] Referring Provider (PT): Alysia Penna, MD   Encounter Date: 09/19/2018  PT End of Session - 09/19/18 0838    Visit Number  4    Number of Visits  8    Date for PT Re-Evaluation  10/03/18    Authorization Type  [employer] H[employer]    Authorization Time Period  09/05/18- 10/03/18    Authorization - Visit Number  4    Authorization - Number of Visits  10    PT Start Time  0830    PT Stop Time  0910    PT Time Calculation (min)  40 min    Activity Tolerance  Patient tolerated treatment well    Behavior During Therapy  [facility] for tasks assessed/performed       Past Medical History:  Diagnosis Date  . Degenerative disk disease   . GERD (gastroesophageal reflux disease)   . Hypertension     Past Surgical History:  Procedure Laterality Date  . ABDOMINAL SURGERY    . APPENDECTOMY    . CHOLECYSTECTOMY    . COLONOSCOPY N/A 02/01/2014   2 SIMPLE ADNEOMA(HF), HYEPRPLASTIC RECTAL POLYPS  . DILATION AND CURETTAGE OF UTERUS    . NECK SURGERY    . PARTIAL HYSTERECTOMY      There were no vitals filed for this visit.  Subjective Assessment - 09/19/18 0836    Subjective  Pt stated she is pain free currently.  Reports she had a busy weekend, reports son and nephew injured BLE over weekend had to take care of them.    Patient Stated Goals  To improve mobility    Currently in Pain?  No/denies                       [facility] Adult PT Treatment/Exercise - 09/19/18 0001      Exercises   Exercises  Lumbar      Lumbar Exercises: Stretches   Lower Trunk Rotation  5 reps;10 seconds    Standing Extension  10 reps;5 seconds    Prone on Elbows Stretch  1 rep;Limitations    Prone on Elbows Stretch Limitations  2 minutes    Press Ups   5 reps   5x 5"   Press Ups Limitations  noted weak UE      Lumbar Exercises: Standing   Other Standing Lumbar Exercises  3D hip excursion 10x (squat infront of chair)    Other Standing Lumbar Exercises  hip extension and abduction 10x; Paloff tandem stance on foam with RTB 10x 4      Lumbar Exercises: Supine   Bridge  10 reps;3 seconds      Lumbar Exercises: Sidelying   Clam  Both;15 reps;3 seconds    Clam Limitations  GTB    Hip Abduction  10 reps               PT Short Term Goals - 09/05/18 1549      PT SHORT TERM GOAL #1   Title  Patient will demonstrate understanding and report regular compliance with HEP in order to improve strength, ROM, and overall functional mobility.     Time  2    Period  Weeks    Status  New    Target  Date  09/19/18        PT Long Term Goals - 09/05/18 1550      PT LONG TERM GOAL #1   Title  Patient will demonstrate improvement of 5/5 MMT strength grade in all deficient muscle groups to improve mechanics with ambulation and with transitions to and from a chair.    Time  4    Period  Weeks    Status  New    Target Date  10/03/18      PT LONG TERM GOAL #2   Title  Patient will report that her pain has not exceeded a 3/10 over the course of a 1 week period indicating improved tolerance to daily activities.     Time  4    Period  Weeks    Status  New    Target Date  10/03/18      PT LONG TERM GOAL #3   Title  Patient will demonstrate improvement of at least 10% on FOTO indicatining improved perceieved fucntional mobility relating to her back.    Time  4    Period  Weeks    Status  New    Target Date  10/03/18            Plan - 09/19/18 0904    Clinical Impression Statement  Continued with established POC for lumbar extension and core strengthening.  Added 3D hip excursion for lumbar mobility as well as prone press-ups.  Pt limited with UE weakness during prone press-ups.  Also added Paloff in tandem stance for core  strengthening.  No reports of pain through session.  Pt will continue to benefit from lumbar extension exercises.    Personal Factors and Comorbidities  Comorbidity 1    Comorbidities  arthritis    Examination-Activity Limitations  Stand;Sit    Examination-Participation Restrictions  Community Activity    Stability/Clinical Decision Making  Stable/Uncomplicated    Clinical Decision Making  Low    Rehab Potential  Good    PT Frequency  2x / week    PT Duration  4 weeks    PT Treatment/Interventions  ADLs/Self Care Home Management;Electrical Stimulation;Moist Heat;Traction;DME Instruction;Gait training;Stair training;Functional mobility training;Therapeutic activities;Therapeutic exercise;Balance training;Neuromuscular re-education;Patient/family education;Orthotic Fit/Training;Manual techniques;Passive range of motion;Dry needling;Energy conservation;Taping    PT Next Visit Plan  Continue wiht lumbar stabilization and stretches.  Focus on lumbar extension exercises and core strengthening with pallof press.  Add cat/camel next session and wall push-ups.    PT Home Exercise Plan  Eval: Double knee to chest 3x30''; LTR, SKTC, bridge; 9/15: POE and clam       Patient will benefit from skilled therapeutic intervention in order to improve the following deficits and impairments:  Pain, Decreased mobility, Decreased range of motion, Decreased strength, Hypomobility  Visit Diagnosis: Chronic low back pain, unspecified back pain laterality, unspecified whether sciatica present  Muscle weakness (generalized)  Other symptoms and signs involving the musculoskeletal system     Problem List Patient Active Problem List   Diagnosis Date Noted  . Primary osteoarthritis of left hip 11/18/2015  . Chronic left hip pain 11/07/2015  . Spondylosis without myelopathy or radiculopathy, lumbar region 11/07/2015  . Special screening for malignant neoplasms, colon   . Left leg weakness 10/02/2012  . Unstable  balance 10/02/2012  . IMPINGEMENT SYNDROME 07/28/2009  . SHOULDER STRAIN, LEFT 07/28/2009   Ihor Austin, LPTA; [place]  Aldona Lento 09/19/2018, 11:35 AM  [place] [address]  [place], [place], [address] Phone: [phone]   Fax:  [phone]  Name: [name] MRN: [id number] Date of Birth: Apr 01, 1965

## 2018-09-19 NOTE — Patient Instructions (Signed)
Abduction: Clam (Eccentric) - Side-Lying    Lie on side with knees bent. Lift top knee, keeping feet together. Keep trunk steady. Slowly lower for 3-5 seconds. 15 reps per set,1 sets per day, 4 days per week. Add theraband resistance above knees  http://ecce.exer.us/65   Copyright  VHI. All rights reserved.   On Elbows (Prone)    Rise up on elbows as high as possible, keeping hips on floor. Hold 30 seconds. Repeat 3 times per set. Do 1 sets per session.   http://orth.exer.us/93   Copyright  VHI. All rights reserved.

## 2018-09-21 ENCOUNTER — Ambulatory Visit (HOSPITAL_COMMUNITY): Payer: Medicare HMO

## 2018-09-21 ENCOUNTER — Encounter (HOSPITAL_COMMUNITY): Payer: Self-pay

## 2018-09-21 ENCOUNTER — Other Ambulatory Visit: Payer: Self-pay

## 2018-09-21 DIAGNOSIS — G8929 Other chronic pain: Secondary | ICD-10-CM | POA: Diagnosis not present

## 2018-09-21 DIAGNOSIS — M545 Low back pain, unspecified: Secondary | ICD-10-CM

## 2018-09-21 DIAGNOSIS — M6281 Muscle weakness (generalized): Secondary | ICD-10-CM

## 2018-09-21 DIAGNOSIS — M25552 Pain in left hip: Secondary | ICD-10-CM | POA: Diagnosis not present

## 2018-09-21 DIAGNOSIS — R29898 Other symptoms and signs involving the musculoskeletal system: Secondary | ICD-10-CM

## 2018-09-21 NOTE — Therapy (Signed)
[place], [place], [address] Phone: [phone]   Fax:  [phone]  Physical Therapy Treatment  Patient Details  Name: [name] MRN: [id number] Date of Birth: 06-05-1965 Referring Provider (PT): Alysia Penna, MD   Encounter Date: 09/21/2018  PT End of Session - 09/21/18 0924    Visit Number  5    Number of Visits  8    Date for PT Re-Evaluation  10/03/18    Authorization Type  [employer] H[employer]    Authorization Time Period  09/05/18- 10/03/18    Authorization - Visit Number  5    Authorization - Number of Visits  10    PT Start Time  0919    PT Stop Time  1001    PT Time Calculation (min)  42 min    Activity Tolerance  Patient tolerated treatment well    Behavior During Therapy  [facility] for tasks assessed/performed       Past Medical History:  Diagnosis Date  . Degenerative disk disease   . GERD (gastroesophageal reflux disease)   . Hypertension     Past Surgical History:  Procedure Laterality Date  . ABDOMINAL SURGERY    . APPENDECTOMY    . CHOLECYSTECTOMY    . COLONOSCOPY N/A 02/01/2014   2 SIMPLE ADNEOMA(HF), HYEPRPLASTIC RECTAL POLYPS  . DILATION AND CURETTAGE OF UTERUS    . NECK SURGERY    . PARTIAL HYSTERECTOMY      There were no vitals filed for this visit.  Subjective Assessment - 09/21/18 0917    Subjective  Pt stated she is a little sore today following mowing yesterday.  No reports of pain today.    Patient Stated Goals  To improve mobility    Currently in Pain?  No/denies         [facility] PT Assessment - 09/21/18 0001      Assessment   Medical Diagnosis  spondylosis    Referring Provider (PT)  Alysia Penna, MD    Next MD Visit  10/05/18    Prior Therapy  yes, earlier this year for left hip                   [facility] Adult PT Treatment/Exercise - 09/21/18 0001      Exercises   Exercises  Lumbar      Lumbar Exercises: Stretches   Standing Extension   10 reps;5 seconds    Prone on Elbows Stretch  1 rep;Limitations    Prone on Elbows Stretch Limitations  2 minutes    Press Ups  5 reps;10 seconds      Lumbar Exercises: Standing   Functional Squats  15 reps    Functional Squats Limitations  3D hip excursion    Other Standing Lumbar Exercises  Wall push-ups 10x     Other Standing Lumbar Exercises  hip extension/abd no HHA 15x BLE      Lumbar Exercises: Sidelying   Hip Abduction  15 reps      Lumbar Exercises: Prone   Straight Leg Raise  10 reps      Lumbar Exercises: Quadruped   Madcat/Old Horse  10 reps    Madcat/Old Horse Limitations  10" holds               PT Short Term Goals - 09/05/18 1549      PT SHORT TERM GOAL #1   Title  Patient will demonstrate understanding and report regular  compliance with HEP in order to improve strength, ROM, and overall functional mobility.     Time  2    Period  Weeks    Status  New    Target Date  09/19/18        PT Long Term Goals - 09/05/18 1550      PT LONG TERM GOAL #1   Title  Patient will demonstrate improvement of 5/5 MMT strength grade in all deficient muscle groups to improve mechanics with ambulation and with transitions to and from a chair.    Time  4    Period  Weeks    Status  New    Target Date  10/03/18      PT LONG TERM GOAL #2   Title  Patient will report that her pain has not exceeded a 3/10 over the course of a 1 week period indicating improved tolerance to daily activities.     Time  4    Period  Weeks    Status  New    Target Date  10/03/18      PT LONG TERM GOAL #3   Title  Patient will demonstrate improvement of at least 10% on FOTO indicatining improved perceieved fucntional mobility relating to her back.    Time  4    Period  Weeks    Status  New    Target Date  10/03/18            Plan - 09/21/18 0936    Clinical Impression Statement  Added quadruped cat/camel, hip extension and wall pushup for spinal mobility, postural and gluteal  strengthening.  Pt able to complete new exercises with minimal difficulty, good form following initial cueing.  No reports of pain through session.    Personal Factors and Comorbidities  Comorbidity 1    Comorbidities  arthritis    Examination-Activity Limitations  Stand;Sit    Examination-Participation Restrictions  Community Activity    Clinical Decision Making  Low    Rehab Potential  Good    PT Frequency  2x / week    PT Duration  4 weeks    PT Treatment/Interventions  ADLs/Self Care Home Management;Electrical Stimulation;Moist Heat;Traction;DME Instruction;Gait training;Stair training;Functional mobility training;Therapeutic activities;Therapeutic exercise;Balance training;Neuromuscular re-education;Patient/family education;Orthotic Fit/Training;Manual techniques;Passive range of motion;Dry needling;Energy conservation;Taping    PT Next Visit Plan  Continuue lumbar stabilization and stretches.  Focus on lumbar extnesion exercsises.  Next session continue cat/camel, wall push ups and paloff press.  Add postural strengthening wiht theraband next session.    PT Home Exercise Plan  Eval: Double knee to chest 3x30''; LTR, SKTC, bridge; 9/15: POE and clam       Patient will benefit from skilled therapeutic intervention in order to improve the following deficits and impairments:  Pain, Decreased mobility, Decreased range of motion, Decreased strength, Hypomobility  Visit Diagnosis: Muscle weakness (generalized)  Other symptoms and signs involving the musculoskeletal system  Chronic low back pain, unspecified back pain laterality, unspecified whether sciatica present     Problem List Patient Active Problem List   Diagnosis Date Noted  . Primary osteoarthritis of left hip 11/18/2015  . Chronic left hip pain 11/07/2015  . Spondylosis without myelopathy or radiculopathy, lumbar region 11/07/2015  . Special screening for malignant neoplasms, colon   . Left leg weakness 10/02/2012  .  Unstable balance 10/02/2012  . IMPINGEMENT SYNDROME 07/28/2009  . SHOULDER STRAIN, LEFT 07/28/2009   [name], [place]; [place]  Aldona Lento 09/21/2018, 11:15 AM  [place], [place], [address] Phone: [phone]   Fax:  [phone]  Name: [name] MRN: [id number] Date of Birth: November 25, 1965

## 2018-09-26 ENCOUNTER — Ambulatory Visit (HOSPITAL_COMMUNITY): Payer: Medicare HMO

## 2018-09-26 ENCOUNTER — Telehealth (HOSPITAL_COMMUNITY): Payer: Self-pay | Admitting: Family Medicine

## 2018-09-26 NOTE — Telephone Encounter (Signed)
pt called to cx she said she let her dog out and he came back in and jumped on her and she almost threw her back out, she can't get it together this morning  9/22/0

## 2018-09-28 ENCOUNTER — Encounter: Payer: Self-pay | Admitting: Physical Medicine & Rehabilitation

## 2018-09-28 ENCOUNTER — Encounter (HOSPITAL_COMMUNITY): Payer: Self-pay

## 2018-09-28 ENCOUNTER — Ambulatory Visit (HOSPITAL_COMMUNITY): Payer: Medicare HMO

## 2018-09-28 ENCOUNTER — Encounter: Payer: Medicare HMO | Attending: Physical Medicine & Rehabilitation | Admitting: Physical Medicine & Rehabilitation

## 2018-09-28 ENCOUNTER — Encounter: Payer: Medicare HMO | Admitting: Physical Medicine & Rehabilitation

## 2018-09-28 ENCOUNTER — Other Ambulatory Visit: Payer: Self-pay

## 2018-09-28 VITALS — HR 78 | Temp 96.5°F | Ht 67.0 in | Wt 158.0 lb

## 2018-09-28 DIAGNOSIS — Z79891 Long term (current) use of opiate analgesic: Secondary | ICD-10-CM | POA: Diagnosis not present

## 2018-09-28 DIAGNOSIS — G8929 Other chronic pain: Secondary | ICD-10-CM

## 2018-09-28 DIAGNOSIS — M545 Low back pain, unspecified: Secondary | ICD-10-CM

## 2018-09-28 DIAGNOSIS — M47816 Spondylosis without myelopathy or radiculopathy, lumbar region: Secondary | ICD-10-CM

## 2018-09-28 DIAGNOSIS — R29898 Other symptoms and signs involving the musculoskeletal system: Secondary | ICD-10-CM

## 2018-09-28 DIAGNOSIS — M6281 Muscle weakness (generalized): Secondary | ICD-10-CM

## 2018-09-28 DIAGNOSIS — Z5181 Encounter for therapeutic drug level monitoring: Secondary | ICD-10-CM

## 2018-09-28 DIAGNOSIS — M25552 Pain in left hip: Secondary | ICD-10-CM | POA: Diagnosis not present

## 2018-09-28 DIAGNOSIS — Z79899 Other long term (current) drug therapy: Secondary | ICD-10-CM | POA: Insufficient documentation

## 2018-09-28 DIAGNOSIS — G894 Chronic pain syndrome: Secondary | ICD-10-CM | POA: Diagnosis not present

## 2018-09-28 NOTE — Progress Notes (Signed)
Subjective:    Patient ID: [name], female    DOB: 07-Nov-1965, 53 y.o.   MRN: [id number]  HPI 53 year old female with history of chronic left hip pain secondary to osteoarthritis as well as lumbar spondylosis without myelopathy who has had chronic pain.  She has undergone left hip intra-articular injection August 18, 2017 as well as bilateral L3-L4 medial branch L5 dorsal ramus blocks on 09/29/2017.  The patient was last seen on 08/24/2018 and was complaining of some increasing low back pain.  She was referred to outpatient physical therapy and was doing better until her son's dog jumped up on her.  The dog surprised her and she bent backwards and had onset of pain.  She did not have a fall. She has followed up in outpatient PT and I reviewed therapy notes.  She has pain with extension based exercises and relief with flexion-based exercise. The patient does not have any pain radiating down the lower extremities no bowel or bladder dysfunction. Her hip pain is doing okay Pain Inventory Average Pain 5 Pain Right Now 9 My pain is sharp and aching  In the last 24 hours, has pain interfered with the following? General activity 8 Relation with others 8 Enjoyment of life 8 What TIME of day is your pain at its worst? all Sleep (in general) Poor  Pain is worse with: walking, bending, sitting and standing Pain improves with: medication Relief from Meds: 4  Mobility walk without assistance how many minutes can you walk? 30 ability to climb steps?  yes do you drive?  yes  Function disabled: date disabled . I need assistance with the following:  meal prep and household duties  Neuro/Psych No problems in this area  Prior Studies Any changes since last visit?  no  Physicians involved in your care Any changes since last visit?  no   History reviewed. No pertinent family history. Social History   Socioeconomic History  . Marital status: Widowed    Spouse name: Not on file   . Number of children: Not on file  . Years of education: Not on file  . Highest education level: Not on file  Occupational History  . Not on file  Social Needs  . Financial resource strain: Not on file  . Food insecurity    Worry: Not on file    Inability: Not on file  . Transportation needs    Medical: Not on file    Non-medical: Not on file  Tobacco Use  . Smoking status: Current Every Day Smoker    Packs/day: 0.50    Types: Cigarettes  . Smokeless tobacco: Never Used  Substance and Sexual Activity  . Alcohol use: No  . Drug use: No  . Sexual activity: Not on file  Lifestyle  . Physical activity    Days per week: Not on file    Minutes per session: Not on file  . Stress: Not on file  Relationships  . Social Herbalist on phone: Not on file    Gets together: Not on file    Attends religious service: Not on file    Active member of club or organization: Not on file    Attends meetings of clubs or organizations: Not on file    Relationship status: Not on file  Other Topics Concern  . Not on file  Social History Narrative  . Not on file   Past Surgical History:  Procedure Laterality Date  . ABDOMINAL  SURGERY    . APPENDECTOMY    . CHOLECYSTECTOMY    . COLONOSCOPY N/A 02/01/2014   2 SIMPLE ADNEOMA(HF), HYEPRPLASTIC RECTAL POLYPS  . DILATION AND CURETTAGE OF UTERUS    . NECK SURGERY    . PARTIAL HYSTERECTOMY     Past Medical History:  Diagnosis Date  . Degenerative disk disease   . GERD (gastroesophageal reflux disease)   . Hypertension    Pulse 78   Temp (!) 96.5 F (35.8 C)   Ht 5\' 7"  (1.702 m)   Wt 158 lb (71.7 kg)   SpO2 96%   BMI 24.75 kg/m   Opioid Risk Score:   Fall Risk Score:  `1  Depression screen PHQ 2/9  Depression screen [facility] 2/9 02/17/2017 11/07/2015  Decreased Interest 2 2  Down, Depressed, Hopeless 1 1  PHQ - 2 Score 3 3  Altered sleeping - 3  Tired, decreased energy - 2  Change in appetite - 2  Feeling bad or failure  about yourself  - 0  Trouble concentrating - 1  Moving slowly or fidgety/restless - 2  Suicidal thoughts - 0  PHQ-9 Score - 13    Review of Systems  Constitutional: Negative.   HENT: Negative.   Eyes: Negative.   Respiratory: Negative.   Gastrointestinal: Negative.   Endocrine: Negative.   Genitourinary: Negative.   Musculoskeletal: Positive for back pain.  Skin: Negative.   Allergic/Immunologic: Negative.   Neurological: Negative.   Hematological: Negative.   Psychiatric/Behavioral: Negative.   All other systems reviewed and are negative.      Objective:   Physical Exam Vitals signs and nursing note reviewed.  Constitutional:      Appearance: Normal appearance.  Eyes:     Extraocular Movements: Extraocular movements intact.     Conjunctiva/sclera: Conjunctivae normal.     Pupils: Pupils are equal, round, and reactive to light.  Skin:    General: Skin is warm and dry.  Neurological:     General: No focal deficit present.     Mental Status: She is alert and oriented to person, place, and time.     Motor: No weakness.     Gait: Gait normal.  Psychiatric:        Mood and Affect: Mood normal.        Behavior: Behavior normal.     Motor strength is 5/5 bilateral hip flexor knee extensor ankle dorsiflexor She has tenderness palpation bilateral L4 L5-S1 paraspinals. She has no tenderness over the greater trochanter of the hip. She has no pain with hip internal/external rotation Negative straight leg raising Ambulates without assistive device no evidence of toe drag or knee instability.      Assessment & Plan:  1.  Acute exacerbation of chronic low back pain.  She was having some increased pain last visit but had some improvements during her PT.  She has had exacerbation with hyperextension injury.  This pain is consistent with her lumbar spondylosis in the lower facet joints.  We will schedule for repeat bilateral L3-L4 medial branch and bilateral L5 dorsal ramus  injections under fluoroscopic guidance.  Schedule in 2 weeks Previous injection 1 year ago and was doing well up until about 2 months ago. In the interval time she will continue with her as needed tramadol PDMP reviewed UDS scheduled for today May use as needed baclofen as a muscle relaxer, patient already has this at home. Discussed use of heat Discussed the use of counter irritant cream such as  icy hot

## 2018-09-28 NOTE — Therapy (Signed)
[place], [place], [address] Phone: [phone]   Fax:  [phone]  Physical Therapy Treatment  Patient Details  Name: [name] MRN: [id number] Date of Birth: March 30, 1965 Referring Provider (PT): Alysia Penna, MD   Encounter Date: 09/28/2018  PT End of Session - 09/28/18 0929    Visit Number  6    Number of Visits  8    Date for PT Re-Evaluation  10/03/18    Authorization Type  [employer] H[employer]    Authorization Time Period  09/05/18- 10/03/18    Authorization - Visit Number  6    Authorization - Number of Visits  10    PT Start Time  0919    PT Stop Time  0957    PT Time Calculation (min)  38 min    Activity Tolerance  Patient limited by pain    Behavior During Therapy  [facility] for tasks assessed/performed       Past Medical History:  Diagnosis Date  . Degenerative disk disease   . GERD (gastroesophageal reflux disease)   . Hypertension     Past Surgical History:  Procedure Laterality Date  . ABDOMINAL SURGERY    . APPENDECTOMY    . CHOLECYSTECTOMY    . COLONOSCOPY N/A 02/01/2014   2 SIMPLE ADNEOMA(HF), HYEPRPLASTIC RECTAL POLYPS  . DILATION AND CURETTAGE OF UTERUS    . NECK SURGERY    . PARTIAL HYSTERECTOMY      There were no vitals filed for this visit.  Subjective Assessment - 09/28/18 0923    Subjective  Pt reports her son's dog came running at her full speed and jumped on her, stated she felt like a disk moved in lower back.  Current pain scale 9/10 throbbing constant pain.    Patient Stated Goals  To improve mobility    Currently in Pain?  Yes    Pain Score  9     Pain Location  Back    Pain Orientation  Lower    Pain Descriptors / Indicators  Throbbing    Pain Type  Chronic pain    Pain Onset  More than a month ago    Pain Frequency  Constant    Aggravating Factors   standing and walking    Pain Relieving Factors  prednisone- no longer taking    Effect of Pain on Daily  Activities  moderately affects         [facility] PT Assessment - 09/28/18 0001      Assessment   Medical Diagnosis  spondylosis    Referring Provider (PT)  Alysia Penna, MD    Next MD Visit  09/28/18    Prior Therapy  yes, earlier this year for left hip                   [facility] Adult PT Treatment/Exercise - 09/28/18 0001      Exercises   Exercises  Lumbar      Lumbar Exercises: Stretches   Single Knee to Chest Stretch  2 reps;30 seconds    Prone on Elbows Stretch  1 rep;Limitations    Prone on Elbows Stretch Limitations  2 minutes, pain wiht extension today.  Prone wiht MHP      Lumbar Exercises: Supine   Ab Set  10 reps;3 seconds    AB Set Limitations  paired with breathing    Bent Knee Raise  3 seconds;20 reps    Bent  Knee Raise Limitations  with ab set. Alternating sides.     Bridge  10 reps;3 seconds      Lumbar Exercises: Quadruped   Madcat/Old Horse  5 reps    Madcat/Old Horse Limitations  reports pain with extension      Modalities   Modalities  Moist Heat      Moist Heat Therapy   Number Minutes Moist Heat  4 Minutes    Moist Heat Location  Lumbar Spine      Manual Therapy   Manual Therapy  --    Manual therapy comments  --    Soft tissue mobilization  --               PT Short Term Goals - 09/05/18 1549      PT SHORT TERM GOAL #1   Title  Patient will demonstrate understanding and report regular compliance with HEP in order to improve strength, ROM, and overall functional mobility.     Time  2    Period  Weeks    Status  New    Target Date  09/19/18        PT Long Term Goals - 09/05/18 1550      PT LONG TERM GOAL #1   Title  Patient will demonstrate improvement of 5/5 MMT strength grade in all deficient muscle groups to improve mechanics with ambulation and with transitions to and from a chair.    Time  4    Period  Weeks    Status  New    Target Date  10/03/18      PT LONG TERM GOAL #2   Title  Patient will report  that her pain has not exceeded a 3/10 over the course of a 1 week period indicating improved tolerance to daily activities.     Time  4    Period  Weeks    Status  New    Target Date  10/03/18      PT LONG TERM GOAL #3   Title  Patient will demonstrate improvement of at least 10% on FOTO indicatining improved perceieved fucntional mobility relating to her back.    Time  4    Period  Weeks    Status  New    Target Date  10/03/18            Plan - 09/28/18 0930    Clinical Impression Statement  Pt limited by pain this session following a dog jumping on her with increased LBP.    Pt with pain with palpation L4-S2 and sacral pain and pain wiht extension based exercises.  Session focus on abdominal strengthening to support lumbar region and flexion based stretches, added MHP for pain control wiht reports of some relief.  MD apt following this session.    Personal Factors and Comorbidities  Comorbidity 1    Comorbidities  arthritis    Examination-Activity Limitations  Stand;Sit    Examination-Participation Restrictions  Community Activity    Stability/Clinical Decision Making  Stable/Uncomplicated    Clinical Decision Making  Low    Rehab Potential  Good    PT Frequency  2x / week    PT Duration  4 weeks    PT Treatment/Interventions  ADLs/Self Care Home Management;Electrical Stimulation;Moist Heat;Traction;DME Instruction;Gait training;Stair training;Functional mobility training;Therapeutic activities;Therapeutic exercise;Balance training;Neuromuscular re-education;Patient/family education;Orthotic Fit/Training;Manual techniques;Passive range of motion;Dry needling;Energy conservation;Taping    PT Next Visit Plan  F/U with MD apt 09/28/18.    PT Home Exercise  Plan  Eval: Double knee to chest 3x30''; LTR, SKTC, bridge; 9/15: POE and clam       Patient will benefit from skilled therapeutic intervention in order to improve the following deficits and impairments:  Pain, Decreased mobility,  Decreased range of motion, Decreased strength, Hypomobility  Visit Diagnosis: Other symptoms and signs involving the musculoskeletal system  Chronic low back pain, unspecified back pain laterality, unspecified whether sciatica present  Pain in left hip  Muscle weakness (generalized)     Problem List Patient Active Problem List   Diagnosis Date Noted  . Primary osteoarthritis of left hip 11/18/2015  . Chronic left hip pain 11/07/2015  . Spondylosis without myelopathy or radiculopathy, lumbar region 11/07/2015  . Special screening for malignant neoplasms, colon   . Left leg weakness 10/02/2012  . Unstable balance 10/02/2012  . IMPINGEMENT SYNDROME 07/28/2009  . SHOULDER STRAIN, LEFT 07/28/2009   Ihor Austin, [place]; [place]  Aldona Lento 09/28/2018, 10:01 AM  [place], [place], [address] Phone: [phone]   Fax:  [phone]  Name: [name] MRN: [id number] Date of Birth: 18-Oct-1965

## 2018-10-02 LAB — TOXASSURE SELECT,+ANTIDEPR,UR

## 2018-10-03 ENCOUNTER — Encounter (HOSPITAL_COMMUNITY): Payer: Self-pay

## 2018-10-03 ENCOUNTER — Ambulatory Visit (HOSPITAL_COMMUNITY): Payer: Medicare HMO

## 2018-10-03 ENCOUNTER — Other Ambulatory Visit: Payer: Self-pay

## 2018-10-03 DIAGNOSIS — M25552 Pain in left hip: Secondary | ICD-10-CM | POA: Diagnosis not present

## 2018-10-03 DIAGNOSIS — M6281 Muscle weakness (generalized): Secondary | ICD-10-CM

## 2018-10-03 DIAGNOSIS — G8929 Other chronic pain: Secondary | ICD-10-CM

## 2018-10-03 DIAGNOSIS — R29898 Other symptoms and signs involving the musculoskeletal system: Secondary | ICD-10-CM

## 2018-10-03 DIAGNOSIS — M545 Low back pain: Secondary | ICD-10-CM | POA: Diagnosis not present

## 2018-10-03 NOTE — Therapy (Addendum)
[facility] [address] [place], [place], [address] Phone: [phone]   Fax:  [phone]   Physical Therapy Treatment Progress Note Reporting Period 09/05/18 to 10/03/18  See note below for Objective Data and Assessment of Progress/Goals.      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   Clarene Critchley PT, DPT 5:33 PM, 10/04/18 [phone]    PT End of Session - 10/03/18 0956    Visit Number  7    Number of Visits  8    Date for PT Re-Evaluation  10/03/18    Authorization Type  [employer] H[employer]    Authorization Time Period  09/05/18- 10/03/18    Authorization - Visit Number  7    Authorization - Number of Visits  10    PT Start Time  0918    PT Stop Time  0957    PT Time Calculation (min)  39 min    Activity Tolerance  Patient limited by pain;Patient tolerated treatment well    Behavior During Therapy  [facility] for tasks assessed/performed       Past Medical History:  Diagnosis Date  . Degenerative disk disease   . GERD (gastroesophageal reflux disease)   . Hypertension     Past Surgical History:  Procedure Laterality Date  . ABDOMINAL SURGERY    . APPENDECTOMY    . CHOLECYSTECTOMY    . COLONOSCOPY N/A 02/01/2014   2 SIMPLE ADNEOMA(HF), HYEPRPLASTIC RECTAL POLYPS  . DILATION AND CURETTAGE OF UTERUS    . NECK SURGERY    . PARTIAL HYSTERECTOMY      There were no vitals filed for this visit.  Subjective Assessment - 10/03/18 0921    Subjective  Pt reports MD added muscle relaxors and encouraged heat for LBP.  No reports of pain currently.  Reports the dog running and jumping on her have set her back with progress she was making wiht therapy.  Plans for epidural October 8th.    How long can you sit comfortably?  15 minutes  since back messed up with dog jumping on her (was 45 minutes)    How long can you stand comfortably?  standing is difficult, 5 minutes (was 30 minutes)    How long can you walk comfortably?  20 minutes tops (was 30 minutes, it depends)    Patient Stated Goals  To improve mobility    Currently in Pain?  No/denies         [facility] PT Assessment - 10/03/18 0001      Assessment   Medical Diagnosis  spondylosis    Referring Provider (PT)  Alysia Penna, MD    Next MD Visit  09/28/18    Prior Therapy  yes, earlier this year for left hip      Observation/Other Assessments   Focus on Therapeutic Outcomes (FOTO)   54%   60% limited     AROM   AROM Assessment Site  Lumbar;Hip    Right Hip External Rotation   42   was 50   Right Hip Internal Rotation   30   was 30   Left Hip External Rotation   58   was 53   Left Hip Internal Rotation   30   was 25   Lumbar Flexion  WFL some pain   was 25% limited   Lumbar Extension  75% limited   75% limited   Lumbar - Right Side Bend  WFL    Lumbar - Left Side Bend  WFL    Lumbar - Right Rotation  25% limited    was    Lumbar - Left Rotation  25% limited   was 25% limited     Strength   Right Hip Extension  4+/5   was 4+   Right Hip ABduction  5/5   was 4+   Left Hip Flexion  5/5    Left Hip Extension  4+/5   was 4+   Left Hip ABduction  5/5   was 4+   Right/Left Knee  Right;Left    Right Knee Flexion  5/5    Right Knee Extension  5/5    Left Knee Flexion  5/5    Left Knee Extension  5/5    Right Ankle Dorsiflexion  5/5    Left Ankle Dorsiflexion  5/5      Transfers   Five time sit to stand comments   12.51"   was 15.68 seconds                  [facility] Adult PT Treatment/Exercise - 10/03/18 0001      Exercises   Exercises  Lumbar      Lumbar Exercises: Stretches   Single Knee to Chest Stretch  2 reps;30 seconds    Double Knee to Chest Stretch  3 reps;30 seconds      Lumbar Exercises: Standing   Functional  Squats  15 reps    Functional Squats Limitations  3D hip excursion ( limited extension)      Lumbar Exercises: Supine   Bent Knee Raise  3 seconds;20 reps    Bent Knee Raise Limitations  with ab set. Alternating sides.       Modalities   Modalities  Moist Heat  (Pended)                PT Short Term Goals - 10/03/18 [id number]      PT SHORT TERM GOAL #1   Title  Patient will demonstrate understanding and report regular compliance with HEP in order to improve strength, ROM, and overall functional mobility.     Baseline  9/29:  Reports compliance with HEP daily    Status  Achieved        PT Long Term Goals - 10/03/18 0940      PT LONG TERM GOAL #1   Title  Patient will demonstrate improvement of 5/5 MMT strength grade in all deficient muscle groups to improve mechanics with ambulation and with transitions to and from a chair.    Baseline  9/29: see MMT.  continues to have 4+/5 glut max      PT LONG TERM GOAL #2   Title  Patient will report that her pain has not exceeded a 3/10 over the course of a 1 week period indicating improved tolerance to daily activities.     Baseline  9/29:  Increased pain following dog jumping, average pain scale range 5-8/10      PT LONG TERM GOAL #3   Title  Patient will demonstrate improvement of at least 10% on FOTO indicatining improved perceieved fucntional mobility relating to her back.    Baseline  9/29:  FOTO limitation 54%, was 60%      PT LONG TERM GOAL #4   Title  Patient will demonstrate improvement of left hip internal and external rotation of at least 5 degrees in order to improve ease of putting on shoes and socks.             Plan - 10/03/18 0956    Clinical Impression Statement  Reviewed goals with the following findings:  Pt making gains wiht objective testing including 5STS, MMT and FOTO improved by 6% since initial eval.  Pt continues to be limited by pain, limited lumbar mobility with rotation and pain wiht extension, and  glut max strengthening.  Pt reports plans for epidural on 10/08 for pain control, stated she would like to continue PT for functional strengthening as she feels weak.Plan to continue for an additional 4 weeks to continue addressing deficits.     Personal Factors and Comorbidities  Comorbidity 1    Comorbidities  arthritis    Examination-Activity Limitations  Stand;Sit    Examination-Participation Restrictions  Community Activity    Stability/Clinical Decision Making  Stable/Uncomplicated    Clinical Decision Making  Low    Rehab Potential  Good    PT Frequency  2x / week    PT Duration  4 weeks    PT Treatment/Interventions  ADLs/Self Care Home Management;Electrical Stimulation;Moist Heat;Traction;DME Instruction;Gait training;Stair training;Functional mobility training;Therapeutic activities;Therapeutic exercise;Balance training;Neuromuscular re-education;Patient/family education;Orthotic Fit/Training;Manual techniques;Passive range of motion;Dry needling;Energy conservation;Taping    PT Next Visit Plan  Continue PT for LTGs, glut max strengthening.  Pain controlled wiht flexion based exercises.  Plans for epidural on 10/08 and vacation week of 10/22/18 will be out of town.    PT Home Exercise Plan  Eval: Double knee to chest 3x30''; LTR, SKTC, bridge; 9/15: POE and clam       Patient will benefit from skilled therapeutic intervention in order to improve the following deficits and impairments:  Pain, Decreased mobility, Decreased range of motion, Decreased strength, Hypomobility  Visit Diagnosis: Other symptoms and signs involving the musculoskeletal system  Chronic low back pain, unspecified back pain laterality, unspecified whether sciatica present  Muscle weakness (generalized)     Problem List Patient Active Problem List   Diagnosis Date Noted  . Primary osteoarthritis of left hip 11/18/2015  . Chronic left hip pain 11/07/2015  . Spondylosis without myelopathy or radiculopathy,  lumbar region 11/07/2015  . Special screening for malignant neoplasms, colon   . Left leg weakness 10/02/2012  . Unstable balance 10/02/2012  . IMPINGEMENT SYNDROME 07/28/2009  . SHOULDER STRAIN, LEFT 07/28/2009   Ihor Austin, [place]; [place]  Aldona Lento 10/03/2018, 10:13 AM  [place], [place], [address] Phone: [phone]   Fax:  [phone]  Name: [name] MRN: [id number] Date of Birth: 1965-07-22

## 2018-10-04 ENCOUNTER — Telehealth: Payer: Self-pay | Admitting: *Deleted

## 2018-10-04 DIAGNOSIS — E782 Mixed hyperlipidemia: Secondary | ICD-10-CM | POA: Diagnosis not present

## 2018-10-04 DIAGNOSIS — M1991 Primary osteoarthritis, unspecified site: Secondary | ICD-10-CM | POA: Diagnosis not present

## 2018-10-04 DIAGNOSIS — I10 Essential (primary) hypertension: Secondary | ICD-10-CM | POA: Diagnosis not present

## 2018-10-04 DIAGNOSIS — D509 Iron deficiency anemia, unspecified: Secondary | ICD-10-CM | POA: Diagnosis not present

## 2018-10-04 NOTE — Addendum Note (Signed)
Addended by: Clarene Critchley on: 10/04/2018 05:35 PM   Modules accepted: Orders

## 2018-10-04 NOTE — Telephone Encounter (Signed)
Urine drug screen shows no medications present.  We are prescribing Tramadol but only takes 1/2 tablet prn and reports last dose was 09/27/18 and test was 09/28/18. This may be that the level of metabolite is below the cutoff and that is why it is not detected.

## 2018-10-05 ENCOUNTER — Encounter: Payer: Medicare HMO | Admitting: Physical Medicine & Rehabilitation

## 2018-10-05 ENCOUNTER — Other Ambulatory Visit: Payer: Self-pay

## 2018-10-05 ENCOUNTER — Encounter (HOSPITAL_COMMUNITY): Payer: Self-pay | Admitting: Physical Therapy

## 2018-10-05 ENCOUNTER — Ambulatory Visit (HOSPITAL_COMMUNITY): Payer: Medicare HMO | Attending: Physical Medicine & Rehabilitation | Admitting: Physical Therapy

## 2018-10-05 DIAGNOSIS — M6281 Muscle weakness (generalized): Secondary | ICD-10-CM | POA: Diagnosis not present

## 2018-10-05 DIAGNOSIS — M545 Low back pain, unspecified: Secondary | ICD-10-CM

## 2018-10-05 DIAGNOSIS — R29898 Other symptoms and signs involving the musculoskeletal system: Secondary | ICD-10-CM | POA: Diagnosis not present

## 2018-10-05 DIAGNOSIS — G8929 Other chronic pain: Secondary | ICD-10-CM | POA: Diagnosis not present

## 2018-10-05 DIAGNOSIS — M25552 Pain in left hip: Secondary | ICD-10-CM | POA: Diagnosis not present

## 2018-10-05 NOTE — Therapy (Signed)
[facility] [address] [place], [place], [address] Phone: [phone]   Fax:  [phone]  Physical Therapy Treatment  Patient Details  Name: [name] MRN: [id number] Date of Birth: Apr 14, 1965 Referring Provider (PT): Alysia Penna, MD   Encounter Date: 10/05/2018  PT End of Session - 10/05/18 0928    Visit Number  8    Number of Visits  16    Date for PT Re-Evaluation  11/01/18    Authorization Type  [employer] H[employer]    Authorization Time Period  09/05/18- 10/03/18; 10/05/18- 11/01/18    Authorization - Visit Number  1    Authorization - Number of Visits  10    PT Start Time  0817    PT Stop Time  0900    PT Time Calculation (min)  43 min    Activity Tolerance  Patient limited by pain;Patient tolerated treatment well    Behavior During Therapy  [facility] for tasks assessed/performed       Past Medical History:  Diagnosis Date  . Degenerative disk disease   . GERD (gastroesophageal reflux disease)   . Hypertension     Past Surgical History:  Procedure Laterality Date  . ABDOMINAL SURGERY    . APPENDECTOMY    . CHOLECYSTECTOMY    . COLONOSCOPY N/A 02/01/2014   2 SIMPLE ADNEOMA(HF), HYEPRPLASTIC RECTAL POLYPS  . DILATION AND CURETTAGE OF UTERUS    . NECK SURGERY    . PARTIAL HYSTERECTOMY      There were no vitals filed for this visit.  Subjective Assessment - 10/05/18 0825    Subjective  Patient reported right low back pain this session.    How long can you sit comfortably?  15 minutes since back messed up with dog jumping on her (was 45 minutes)    How long can you stand comfortably?  standing is difficult, 5 minutes (was 30 minutes)    How long can you walk comfortably?  20 minutes tops (was 30 minutes, it depends)    Patient Stated Goals  To improve mobility    Currently in Pain?  Yes    Pain Score  4     Pain Location  Back    Pain Orientation  Lower    Pain Descriptors / Indicators  Throbbing;Aching    Pain Type  Chronic pain         [facility] PT Assessment - 10/05/18 0001      AROM   Right/Left Hip  --   PROM Hip IR/ER did not evoke pain     Palpation   Palpation comment  Tenderness to palpation of right PSIS recreation of her back pain, and minimally with right SI joint                   [facility] Adult PT Treatment/Exercise - 10/05/18 0001      Lumbar Exercises: Stretches   Double Knee to Chest Stretch  3 reps;30 seconds      Lumbar Exercises: Supine   Bent Knee Raise  3 seconds;20 reps    Bent Knee Raise Limitations  with ab set. Alternating sides.     Other Supine Lumbar Exercises  Hip adduction and abduction 10x10'' isometrics with ball and strap      Lumbar Exercises: Prone   Other Prone Lumbar Exercises  POE hip abduction x 10 each LE      Manual Therapy   Manual Therapy  Soft tissue mobilization  Manual therapy comments  Manual complete separate than rest of tx    Soft tissue mobilization  Patient in left sidelying: IASTM with green weighted ball to right gluteals and STM to lumbar paraspinals RT>LT               PT Short Term Goals - 10/03/18 0939      PT SHORT TERM GOAL #1   Title  Patient will demonstrate understanding and report regular compliance with HEP in order to improve strength, ROM, and overall functional mobility.     Baseline  9/29:  Reports compliance with HEP daily    Status  Achieved        PT Long Term Goals - 10/03/18 0940      PT LONG TERM GOAL #1   Title  Patient will demonstrate improvement of 5/5 MMT strength grade in all deficient muscle groups to improve mechanics with ambulation and with transitions to and from a chair.    Baseline  9/29: see MMT.  continues to have 4+/5 glut max      PT LONG TERM GOAL #2   Title  Patient will report that her pain has not exceeded a 3/10 over the course of a 1 week period indicating improved tolerance to daily activities.     Baseline  9/29:  Increased pain following dog jumping,  average pain scale range 5-8/10      PT LONG TERM GOAL #3   Title  Patient will demonstrate improvement of at least 10% on FOTO indicatining improved perceieved fucntional mobility relating to her back.    Baseline  9/29:  FOTO limitation 54%, was 60%      PT LONG TERM GOAL #4   Title  Patient will demonstrate improvement of left hip internal and external rotation of at least 5 degrees in order to improve ease of putting on shoes and socks.             Plan - 10/05/18 0927    Clinical Impression Statement  This session performed a small assessment of patient's aggravating factors. Patient reported tenderness to palpation of right PSIS and around right SI joint. Performed soft tissue mobilization using a weighted ball for gluteals and hands only for mobilization of lumbar paraspinals. Added prone hip abduction to lock out pelvis with exercise with patient denying any increased in pain. Ended with double knee to chest with patient reporting a decrease in symptoms.    Personal Factors and Comorbidities  Comorbidity 1    Comorbidities  arthritis    Examination-Activity Limitations  Stand;Sit    Examination-Participation Restrictions  Community Activity    Stability/Clinical Decision Making  Stable/Uncomplicated    Rehab Potential  Good    PT Frequency  2x / week    PT Duration  4 weeks    PT Treatment/Interventions  ADLs/Self Care Home Management;Electrical Stimulation;Moist Heat;Traction;DME Instruction;Gait training;Stair training;Functional mobility training;Therapeutic activities;Therapeutic exercise;Balance training;Neuromuscular re-education;Patient/family education;Orthotic Fit/Training;Manual techniques;Passive range of motion;Dry needling;Energy conservation;Taping    PT Next Visit Plan  Continue PT for LTGs, glut max strengthening.  Pain controlled wiht flexion based exercises.  Plans for epidural on 10/08 and vacation week of 10/22/18 will be out of town.    PT Home Exercise Plan   Eval: Double knee to chest 3x30''; LTR, SKTC, bridge; 9/15: POE and clam       Patient will benefit from skilled therapeutic intervention in order to improve the following deficits and impairments:  Pain, Decreased mobility, Decreased range of motion,  Decreased strength, Hypomobility  Visit Diagnosis: Other symptoms and signs involving the musculoskeletal system  Chronic low back pain, unspecified back pain laterality, unspecified whether sciatica present  Muscle weakness (generalized)     Problem List Patient Active Problem List   Diagnosis Date Noted  . Primary osteoarthritis of left hip 11/18/2015  . Chronic left hip pain 11/07/2015  . Spondylosis without myelopathy or radiculopathy, lumbar region 11/07/2015  . Special screening for malignant neoplasms, colon   . Left leg weakness 10/02/2012  . Unstable balance 10/02/2012  . IMPINGEMENT SYNDROME 07/28/2009  . SHOULDER STRAIN, LEFT 07/28/2009   Clarene Critchley PT, DPT 9:31 AM, 10/05/18 [place], [place], [address] Phone: [phone]   Fax:  [phone]  Name: [name] MRN: [id number] Date of Birth: January 26, 1965

## 2018-10-09 ENCOUNTER — Encounter (HOSPITAL_COMMUNITY): Payer: Self-pay | Admitting: Physical Therapy

## 2018-10-09 ENCOUNTER — Ambulatory Visit (HOSPITAL_COMMUNITY): Payer: Medicare HMO | Admitting: Physical Therapy

## 2018-10-09 ENCOUNTER — Other Ambulatory Visit: Payer: Self-pay

## 2018-10-09 DIAGNOSIS — G8929 Other chronic pain: Secondary | ICD-10-CM | POA: Diagnosis not present

## 2018-10-09 DIAGNOSIS — M545 Low back pain, unspecified: Secondary | ICD-10-CM

## 2018-10-09 DIAGNOSIS — M25552 Pain in left hip: Secondary | ICD-10-CM | POA: Diagnosis not present

## 2018-10-09 DIAGNOSIS — M6281 Muscle weakness (generalized): Secondary | ICD-10-CM

## 2018-10-09 DIAGNOSIS — R29898 Other symptoms and signs involving the musculoskeletal system: Secondary | ICD-10-CM | POA: Diagnosis not present

## 2018-10-09 NOTE — Patient Instructions (Signed)
Access Code: [email or web address]  URL: [email or web address]  Date: 10/09/2018  Prepared by: Yetta Glassman   Exercises Sidelying Thoracic Lumbar Rotation - 15 reps - 1 sets - 5 hold - 1x daily - 7x weekly

## 2018-10-09 NOTE — Therapy (Signed)
[facility] [address] [place], [place], [address] Phone: [phone]   Fax:  [phone]  Physical Therapy Treatment  Patient Details  Name: [name] MRN: [id number] Date of Birth: May 10, 1965 Referring Provider (PT): Alysia Penna, MD   Encounter Date: 10/09/2018  PT End of Session - 10/09/18 0922    Visit Number  9    Number of Visits  16    Date for PT Re-Evaluation  11/01/18    Authorization Type  [employer] H[employer]    Authorization Time Period  09/05/18- 10/03/18; 10/05/18- 11/01/18    Authorization - Visit Number  2    Authorization - Number of Visits  10    PT Start Time  0921    PT Stop Time  1000    PT Time Calculation (min)  39 min    Activity Tolerance  Patient limited by pain;Patient tolerated treatment well    Behavior During Therapy  [facility] for tasks assessed/performed       Past Medical History:  Diagnosis Date  . Degenerative disk disease   . GERD (gastroesophageal reflux disease)   . Hypertension     Past Surgical History:  Procedure Laterality Date  . ABDOMINAL SURGERY    . APPENDECTOMY    . CHOLECYSTECTOMY    . COLONOSCOPY N/A 02/01/2014   2 SIMPLE ADNEOMA(HF), HYEPRPLASTIC RECTAL POLYPS  . DILATION AND CURETTAGE OF UTERUS    . NECK SURGERY    . PARTIAL HYSTERECTOMY      There were no vitals filed for this visit.  Subjective Assessment - 10/09/18 0924    Subjective  Patient reports ongoing RT side low back pain this morning. Patient denies radicular sx currently.    How long can you sit comfortably?  15 minutes since back messed up with dog jumping on her (was 45 minutes)    How long can you stand comfortably?  standing is difficult, 5 minutes (was 30 minutes)    How long can you walk comfortably?  20 minutes tops (was 30 minutes, it depends)    Patient Stated Goals  To improve mobility    Currently in Pain?  Yes    Pain Score  5     Pain Location  Back    Pain Orientation  Right;Lower          [facility] PT Assessment - 10/09/18 0001      Palpation   Spinal mobility  hypomobile    Palpation comment  Tenderness to palpation about RT lateral sacral border, RT hip external rotators                   [facility] Adult PT Treatment/Exercise - 10/09/18 0001      Lumbar Exercises: Stretches   Other Lumbar Stretch Exercise  RT quad stretch; PROM RT hip external rotation with patient in prone      Lumbar Exercises: Seated   Other Seated Lumbar Exercises  Self lumbar traction in chair using arm rest; 10 x 3 seconds    Other Seated Lumbar Exercises  Hip hinges seated, with focus on core posturing, 10 reps      Lumbar Exercises: Supine   Pelvic Tilt  10 reps      Lumbar Exercises: Sidelying   Other Sidelying Lumbar Exercises  Modified open book stretch, both sides, 5 breathes to end range       Manual Therapy   Manual Therapy  Soft tissue mobilization;Joint mobilization;Passive ROM  Manual therapy comments  Manual complete separate than rest of tx    Joint Mobilization  P/A glides grade II-III to L3-4; transverse process medial glides to L3-4 grade II; A/P lumbar glides, RT side grade II-III    Soft tissue mobilization  To RT glute med, about RT SI sulcus    Passive ROM  --             PT Education - 10/09/18 0926    Education Details  10/5- modified open book stretch HEP    Person(s) Educated  Patient    Methods  Explanation    Comprehension  Verbalized understanding       PT Short Term Goals - 10/03/18 0939      PT SHORT TERM GOAL #1   Title  Patient will demonstrate understanding and report regular compliance with HEP in order to improve strength, ROM, and overall functional mobility.     Baseline  9/29:  Reports compliance with HEP daily    Status  Achieved        PT Long Term Goals - 10/03/18 0940      PT LONG TERM GOAL #1   Title  Patient will demonstrate improvement of 5/5 MMT strength grade in all deficient muscle groups to improve  mechanics with ambulation and with transitions to and from a chair.    Baseline  9/29: see MMT.  continues to have 4+/5 glut max      PT LONG TERM GOAL #2   Title  Patient will report that her pain has not exceeded a 3/10 over the course of a 1 week period indicating improved tolerance to daily activities.     Baseline  9/29:  Increased pain following dog jumping, average pain scale range 5-8/10      PT LONG TERM GOAL #3   Title  Patient will demonstrate improvement of at least 10% on FOTO indicatining improved perceieved fucntional mobility relating to her back.    Baseline  9/29:  FOTO limitation 54%, was 60%      PT LONG TERM GOAL #4   Title  Patient will demonstrate improvement of left hip internal and external rotation of at least 5 degrees in order to improve ease of putting on shoes and socks.             Plan - 10/09/18 0937    Clinical Impression Statement  Patient was limited during todays treatment due to increased pain. Patient contnues to have increased muscle tone about RT low lumbar and RT posterior hip musculature. Session focused mainly on targeted manual therapy today in order to improve muscle tissue mobiliy and reduce lumbar pain to improve tolerance to activity. Patient noted pain reducation of 5/10 to 3/10 post manual treatment. Patient noted improved lumbar rotation mobility post open book stretch. Patient educated on addition of this exercise to HEP, and issued handout. Patient noted mild discomfort with seated hip hinges, but was able to reduce to some degree with cueing on posturing. Will return to/ progress core and BLE strengthening as tolerated.    Personal Factors and Comorbidities  Comorbidity 1    Comorbidities  arthritis    Examination-Activity Limitations  Stand;Sit    Examination-Participation Restrictions  Community Activity    Stability/Clinical Decision Making  Stable/Uncomplicated    Rehab Potential  Good    PT Frequency  2x / week    PT Duration   4 weeks    PT Treatment/Interventions  ADLs/Self Care Home Management;Electrical Stimulation;Moist  Heat;Traction;DME Instruction;Gait training;Stair training;Functional mobility training;Therapeutic activities;Therapeutic exercise;Balance training;Neuromuscular re-education;Patient/family education;Orthotic Fit/Training;Manual techniques;Passive range of motion;Dry needling;Energy conservation;Taping    PT Next Visit Plan  Progress core and BLE strengthing as tolerated. Assess response to manual treatment done this date. (AP of lumbar spine decreased symptoms)    PT Home Exercise Plan  Eval: Double knee to chest 3x30''; LTR, SKTC, bridge; 9/15: POE and clam; 10/5 book stretch       Patient will benefit from skilled therapeutic intervention in order to improve the following deficits and impairments:  Pain, Decreased mobility, Decreased range of motion, Decreased strength, Hypomobility  Visit Diagnosis: Chronic low back pain, unspecified back pain laterality, unspecified whether sciatica present  Muscle weakness (generalized)     Problem List Patient Active Problem List   Diagnosis Date Noted  . Primary osteoarthritis of left hip 11/18/2015  . Chronic left hip pain 11/07/2015  . Spondylosis without myelopathy or radiculopathy, lumbar region 11/07/2015  . Special screening for malignant neoplasms, colon   . Left leg weakness 10/02/2012  . Unstable balance 10/02/2012  . IMPINGEMENT SYNDROME 07/28/2009  . SHOULDER STRAIN, LEFT 07/28/2009    1:36 PM, 10/09/18 Jerene Pitch, DPT Physical Therapy with [facility]  [phone] office   [facility] [address] [place], [place], [address] Phone: [phone]   Fax:  [phone]  Name: DREANA ORTLIP MRN: [id number] Date of Birth: 1965/08/12

## 2018-10-11 ENCOUNTER — Ambulatory Visit (HOSPITAL_COMMUNITY): Payer: Medicare HMO | Admitting: Physical Therapy

## 2018-10-11 ENCOUNTER — Other Ambulatory Visit: Payer: Self-pay

## 2018-10-11 DIAGNOSIS — M25552 Pain in left hip: Secondary | ICD-10-CM | POA: Diagnosis not present

## 2018-10-11 DIAGNOSIS — M545 Low back pain: Secondary | ICD-10-CM | POA: Diagnosis not present

## 2018-10-11 DIAGNOSIS — R29898 Other symptoms and signs involving the musculoskeletal system: Secondary | ICD-10-CM

## 2018-10-11 DIAGNOSIS — M6281 Muscle weakness (generalized): Secondary | ICD-10-CM

## 2018-10-11 DIAGNOSIS — G8929 Other chronic pain: Secondary | ICD-10-CM | POA: Diagnosis not present

## 2018-10-11 NOTE — Therapy (Signed)
[facility] [place], [place], [address] Phone: [phone]   Fax:  [phone]  Physical Therapy Treatment  Patient Details  Name: [name] MRN: [id number] Date of Birth: [birth year] Referring Provider (PT): Alysia Penna, MD   Encounter Date: 10/11/2018  PT End of Session - 10/11/18 1000    Visit Number  10   progress note completed visit #7   Number of Visits  16    Date for PT Re-Evaluation  11/01/18    Authorization Type  [employer] H[employer]    Authorization Time Period  09/05/18- 10/03/18; 10/05/18- 11/01/18    Authorization - Visit Number  3    Authorization - Number of Visits  10    PT Start Time  0836    PT Stop Time  0920    PT Time Calculation (min)  44 min    Activity Tolerance  Patient limited by pain;Patient tolerated treatment well    Behavior During Therapy  [facility] for tasks assessed/performed       Past Medical History:  Diagnosis Date  . Degenerative disk disease   . GERD (gastroesophageal reflux disease)   . Hypertension     Past Surgical History:  Procedure Laterality Date  . ABDOMINAL SURGERY    . APPENDECTOMY    . CHOLECYSTECTOMY    . COLONOSCOPY N/A 02/01/2014   2 SIMPLE ADNEOMA(HF), HYEPRPLASTIC RECTAL POLYPS  . DILATION AND CURETTAGE OF UTERUS    . NECK SURGERY    . PARTIAL HYSTERECTOMY      There were no vitals filed for this visit.                    [facility] Adult PT Treatment/Exercise - 10/11/18 0001      Lumbar Exercises: Stretches   Double Knee to Chest Stretch  3 reps;30 seconds      Lumbar Exercises: Seated   Other Seated Lumbar Exercises  Hip hinges seated, with focus on core posturing, 10 reps      Lumbar Exercises: Supine   Pelvic Tilt  10 reps    Bent Knee Raise  3 seconds;20 reps    Bent Knee Raise Limitations  with ab set. Alternating sides.     Bridge  10 reps;3 seconds      Lumbar Exercises: Sidelying   Other Sidelying Lumbar Exercises   Modified open book stretch, both sides       Manual Therapy   Manual Therapy  Soft tissue mobilization    Manual therapy comments  Manual complete separate than rest of tx    Soft tissue mobilization  To RT glute med, about RT SI sulcus               PT Short Term Goals - 10/03/18 [id number]      PT SHORT TERM GOAL #1   Title  Patient will demonstrate understanding and report regular compliance with HEP in order to improve strength, ROM, and overall functional mobility.     Baseline  9/29:  Reports compliance with HEP daily    Status  Achieved        PT Long Term Goals - 10/03/18 0940      PT LONG TERM GOAL #1   Title  Patient will demonstrate improvement of 5/5 MMT strength grade in all deficient muscle groups to improve mechanics with ambulation and with transitions to and from a chair.    Baseline  9/29: see MMT.  continues  to have 4+/5 glut max      PT LONG TERM GOAL #2   Title  Patient will report that her pain has not exceeded a 3/10 over the course of a 1 week period indicating improved tolerance to daily activities.     Baseline  9/29:  Increased pain following dog jumping, average pain scale range 5-8/10      PT LONG TERM GOAL #3   Title  Patient will demonstrate improvement of at least 10% on FOTO indicatining improved perceieved fucntional mobility relating to her back.    Baseline  9/29:  FOTO limitation 54%, was 60%      PT LONG TERM GOAL #4   Title  Patient will demonstrate improvement of left hip internal and external rotation of at least 5 degrees in order to improve ease of putting on shoes and socks.             Plan - 10/11/18 1000    Clinical Impression Statement  contiued with established POC.  Pt to receive another set of spinal injections tomorrow with hopes this will help. Pain remained at current level with no increase/decrease at end of session.  Continued with stab and stretching with better response to flexion vs extension.  Completed session  with manual to Rt glute and LB region.  No noted tightness, spasm or issues noted with completion.    Personal Factors and Comorbidities  Comorbidity 1    Comorbidities  arthritis    Examination-Activity Limitations  Stand;Sit    Examination-Participation Restrictions  Community Activity    Stability/Clinical Decision Making  Stable/Uncomplicated    Rehab Potential  Good    PT Frequency  2x / week    PT Duration  4 weeks    PT Treatment/Interventions  ADLs/Self Care Home Management;Electrical Stimulation;Moist Heat;Traction;DME Instruction;Gait training;Stair training;Functional mobility training;Therapeutic activities;Therapeutic exercise;Balance training;Neuromuscular re-education;Patient/family education;Orthotic Fit/Training;Manual techniques;Passive range of motion;Dry needling;Energy conservation;Taping    PT Next Visit Plan  Progress core and BLE strengthing as tolerated. Assess response to manual treatment done this date. (AP of lumbar spine decreased symptoms).  Discuss mechanical lumbar traction trial with therapist if no relief.    PT Home Exercise Plan  Eval: Double knee to chest 3x30''; LTR, SKTC, bridge; 9/15: POE and clam; 10/5 book stretch       Patient will benefit from skilled therapeutic intervention in order to improve the following deficits and impairments:  Pain, Decreased mobility, Decreased range of motion, Decreased strength, Hypomobility  Visit Diagnosis: Chronic low back pain, unspecified back pain laterality, unspecified whether sciatica present  Muscle weakness (generalized)  Other symptoms and signs involving the musculoskeletal system  Pain in left hip     Problem List Patient Active Problem List   Diagnosis Date Noted  . Primary osteoarthritis of left hip 11/18/2015  . Chronic left hip pain 11/07/2015  . Spondylosis without myelopathy or radiculopathy, lumbar region 11/07/2015  . Special screening for malignant neoplasms, colon   . Left leg weakness  10/02/2012  . Unstable balance 10/02/2012  . IMPINGEMENT SYNDROME 07/28/2009  . SHOULDER STRAIN, LEFT 07/28/2009   Teena Irani, PTA/CLT [phone]  Teena Irani 10/11/2018, 10:03 AM  [facility] [place], [place], [address] Phone: [phone]   Fax:  [phone]  Name: [name] MRN: [id number] Date of Birth: 1965/06/09

## 2018-10-12 ENCOUNTER — Encounter: Payer: Medicare HMO | Attending: Physical Medicine & Rehabilitation | Admitting: Physical Medicine & Rehabilitation

## 2018-10-12 VITALS — BP 130/76 | HR 60 | Temp 97.2°F | Ht 67.0 in | Wt 160.0 lb

## 2018-10-12 DIAGNOSIS — Z79899 Other long term (current) drug therapy: Secondary | ICD-10-CM | POA: Diagnosis not present

## 2018-10-12 DIAGNOSIS — M47816 Spondylosis without myelopathy or radiculopathy, lumbar region: Secondary | ICD-10-CM | POA: Insufficient documentation

## 2018-10-12 DIAGNOSIS — Z5181 Encounter for therapeutic drug level monitoring: Secondary | ICD-10-CM | POA: Diagnosis not present

## 2018-10-12 NOTE — Patient Instructions (Addendum)
Lumbar medial branch blocks were performed. This is to help diagnose the cause of the low back pain. It is important that you keep track of your pain for the first day or 2 after injection. This injection can give you temporary relief that lasts for hours or up to several months. There is no way to predict duration of pain relief.  Please try to compare your pain after injection to for the injection.  If this injection gives you  temporary relief there may be another longer-lasting procedure that may be beneficial call radiofrequency ablation  May resume therapy tomorrow

## 2018-10-12 NOTE — Progress Notes (Signed)
Bilateral Lumbar L3, L4  medial branch blocks and L 5 dorsal ramus injection under fluoroscopic guidance  Indication: Lumbar pain which is not relieved by medication management or other conservative care and interfering with self-care and mobility.  Informed consent was obtained after describing risks and benefits of the procedure with the patient, this includes bleeding, infection, paralysis and medication side effects.  The patient wishes to proceed and has given written consent.  The patient was placed in prone position.  The lumbar area was marked and prepped with Betadine.  One mL of 1% lidocaine was injected into each of 6 areas into the skin and subcutaneous tissue.  Then a 22-gauge 3.5in spinal needle was inserted targeting the junction of the left S1 superior articular process and sacral ala junction. Needle was advanced under fluoroscopic guidance.  Bone contact was made.  Isovue 200 was injected x 0.5 mL demonstrating no intravascular uptake.  Then a solution  of 2% MPF lidocaine was injected x 0.5 mL.  Then the left L5 superior articular process in transverse process junction was targeted.  Bone contact was made.  Isovue 200 was injected x 0.5 mL demonstrating no intravascular uptake. Then a solution containing  2% MPF lidocaine was injected x 0.5 mL.  Then the left L4 superior articular process in transverse process junction was targeted.  Bone contact was made.  Isovue 200 was injected x 0.5 mL demonstrating no intravascular uptake.  Then a solution containing2% MPF lidocaine was injected x 0.5 mL.  This same procedure was performed on the right side using the same needle, technique and injectate.  Patient tolerated procedure well.  Post procedure instructions were given.  

## 2018-10-12 NOTE — Progress Notes (Signed)
  PROCEDURE RECORD [facility]   Name: [name] DOB:13-Jul-1965 MRN: [id number]  Date:10/12/2018  Physician: Alysia Penna, MD    Nurse/CMA: Jarome Trull, CMA   Allergies:  Allergies  Allergen Reactions  . Augmentin [Amoxicillin-Pot Clavulanate] Nausea And Vomiting  . Shellfish Allergy Hives and Swelling  . Ibuprofen Hives and Swelling  . Moxifloxacin Hcl Hives  . Unasyn [Ampicillin-Sulbactam Sodium] Hives and Swelling    Consent Signed: Yes.    Is patient diabetic? No.  CBG today?   Pregnant: No. LMP: No LMP recorded. Patient has had a hysterectomy. (age 5-55)  Anticoagulants: no Anti-inflammatory: no Antibiotics: no  Procedure: bilateral L3,4 medial branch L5 dorsal ramus Position: Prone Start Time:  10:25am          End Time: 10:37am Fluoro Time: 51s  RN/CMA Sabastien Tyler, CMA Ronnita Paz, CMA    Time 10:10am 10:41am    BP 130/76 126/80    Pulse 60 68    Respirations 14 14    O2 Sat 97 98    S/S 6 6    Pain Level 4/10 2/10     D/C home with sister, patient A & O X 3, D/C instructions reviewed, and sits independently.

## 2018-10-30 ENCOUNTER — Other Ambulatory Visit (HOSPITAL_COMMUNITY): Payer: Self-pay | Admitting: Family Medicine

## 2018-10-30 DIAGNOSIS — Z1231 Encounter for screening mammogram for malignant neoplasm of breast: Secondary | ICD-10-CM

## 2018-11-15 ENCOUNTER — Other Ambulatory Visit: Payer: Self-pay

## 2018-11-15 ENCOUNTER — Ambulatory Visit (HOSPITAL_COMMUNITY)
Admission: RE | Admit: 2018-11-15 | Discharge: 2018-11-15 | Disposition: A | Discharge status: Home / Self Care | Payer: Medicare HMO | Source: Ambulatory Visit | Attending: Family Medicine | Admitting: Family Medicine

## 2018-11-15 DIAGNOSIS — Z1231 Encounter for screening mammogram for malignant neoplasm of breast: Secondary | ICD-10-CM | POA: Insufficient documentation

## 2018-11-23 ENCOUNTER — Encounter: Payer: Medicare HMO | Attending: Physical Medicine & Rehabilitation | Admitting: Physical Medicine & Rehabilitation

## 2018-11-23 ENCOUNTER — Encounter: Payer: Self-pay | Admitting: Physical Medicine & Rehabilitation

## 2018-11-23 ENCOUNTER — Other Ambulatory Visit: Payer: Self-pay

## 2018-11-23 VITALS — BP 139/87 | HR 58 | Temp 97.2°F | Ht 67.0 in | Wt 161.0 lb

## 2018-11-23 DIAGNOSIS — M47816 Spondylosis without myelopathy or radiculopathy, lumbar region: Secondary | ICD-10-CM | POA: Diagnosis not present

## 2018-11-23 DIAGNOSIS — Z5181 Encounter for therapeutic drug level monitoring: Secondary | ICD-10-CM | POA: Insufficient documentation

## 2018-11-23 DIAGNOSIS — Z79899 Other long term (current) drug therapy: Secondary | ICD-10-CM | POA: Diagnosis not present

## 2018-11-23 MED ORDER — TRAMADOL HCL 50 MG PO TABS: 25.0000 mg | ORAL_TABLET | Freq: Four times a day (QID) | ORAL | 0 refills | Status: DC | PRN

## 2018-11-23 NOTE — Progress Notes (Signed)
Subjective:    Patient ID: [name], female    DOB: [birth year], 53 y.o.   MRN: [id number]  HPI 53 year old female with history of chronic low back pain she does have lumbar spondylosis and has had good relief with bilateral L3-L4 medial branch L5 dorsal ramus injections last performed 10 08/2018 she had 50% relief after the procedure which lasted for about a month. Her left hip pain is doing well.  She has undergone left hip intra-articular injections as performed 07/18/2017. The patient has completed her outpatient rehabilitation.  She continues with her home exercise program.  Despite this she still has low back pain.  She has not been taking much tramadol in fact her last prescription was from 2019.  She is down to 4 tablets she only takes 1/2 tablet a day at most   Pain Inventory Average Pain 4 Pain Right Now 7 My pain is sharp and aching  In the last 24 hours, has pain interfered with the following? General activity 5 Relation with others 5 Enjoyment of life 5 What TIME of day is your pain at its worst? all Sleep (in general) Poor  Pain is worse with: walking, bending, standing and some activites Pain improves with: nothing Relief from Meds: 0  Mobility walk without assistance how many minutes can you walk? 10 ability to climb steps?  yes do you drive?  yes Do you have any goals in this area?  yes  Function disabled: date disabled . I need assistance with the following:  meal prep and shopping Do you have any goals in this area?  yes  Neuro/Psych No problems in this area  Prior Studies Any changes since last visit?  no  Physicians involved in your care Any changes since last visit?  no   History reviewed. No pertinent family history. Social History   Socioeconomic History  . Marital status: Widowed    Spouse name: Not on file  . Number of children: Not on file  . Years of education: Not on file  . Highest education level: Not on file   Occupational History  . Not on file  Social Needs  . Financial resource strain: Not on file  . Food insecurity    Worry: Not on file    Inability: Not on file  . Transportation needs    Medical: Not on file    Non-medical: Not on file  Tobacco Use  . Smoking status: Current Every Day Smoker    Packs/day: 0.50    Types: Cigarettes  . Smokeless tobacco: Never Used  Substance and Sexual Activity  . Alcohol use: No  . Drug use: No  . Sexual activity: Not on file  Lifestyle  . Physical activity    Days per week: Not on file    Minutes per session: Not on file  . Stress: Not on file  Relationships  . Social Herbalist on phone: Not on file    Gets together: Not on file    Attends religious service: Not on file    Active member of club or organization: Not on file    Attends meetings of clubs or organizations: Not on file    Relationship status: Not on file  Other Topics Concern  . Not on file  Social History Narrative  . Not on file   Past Surgical History:  Procedure Laterality Date  . ABDOMINAL SURGERY    . APPENDECTOMY    . CHOLECYSTECTOMY    .  COLONOSCOPY N/A 02/01/2014   2 SIMPLE ADNEOMA(HF), HYEPRPLASTIC RECTAL POLYPS  . DILATION AND CURETTAGE OF UTERUS    . NECK SURGERY    . PARTIAL HYSTERECTOMY     Past Medical History:  Diagnosis Date  . Degenerative disk disease   . GERD (gastroesophageal reflux disease)   . Hypertension    BP 139/87   Pulse (!) 58   Temp (!) 97.2 F (36.2 C)   Ht 5\' 7"  (1.702 m)   Wt 161 lb (73 kg)   SpO2 92%   BMI 25.22 kg/m   Opioid Risk Score:   Fall Risk Score:  `1  Depression screen PHQ 2/9  Depression screen [facility] 2/9 02/17/2017 11/07/2015  Decreased Interest 2 2  Down, Depressed, Hopeless 1 1  PHQ - 2 Score 3 3  Altered sleeping - 3  Tired, decreased energy - 2  Change in appetite - 2  Feeling bad or failure about yourself  - 0  Trouble concentrating - 1  Moving slowly or fidgety/restless - 2  Suicidal  thoughts - 0  PHQ-9 Score - 13    Review of Systems  Constitutional: Negative.   HENT: Negative.   Eyes: Negative.   Respiratory: Negative.   Cardiovascular: Negative.   Gastrointestinal: Negative.   Endocrine: Negative.   Genitourinary: Negative.   Musculoskeletal: Positive for back pain.  Skin: Negative.   Allergic/Immunologic: Negative.   Neurological: Negative.   Hematological: Negative.   Psychiatric/Behavioral: Negative.   All other systems reviewed and are negative.      Objective:   Physical Exam Vitals signs and nursing note reviewed.  Constitutional:      Appearance: Normal appearance.  HENT:     Mouth/Throat:     Mouth: Mucous membranes are dry.  Eyes:     Extraocular Movements: Extraocular movements intact.     Conjunctiva/sclera: Conjunctivae normal.     Pupils: Pupils are equal, round, and reactive to light.  Musculoskeletal:     Lumbar back: She exhibits decreased range of motion. She exhibits no tenderness and no deformity.     Comments: Patient has normal lumbar flexion but has limited lumbar extension to approximately 50% of normal range.  Skin:    General: Skin is warm and dry.  Neurological:     General: No focal deficit present.     Mental Status: She is alert and oriented to person, place, and time.  Psychiatric:        Mood and Affect: Mood normal.        Behavior: Behavior normal.           Assessment & Plan:   1.  Lumbar spondylosis without myelopathy The patient has persistent pain despite undergoing physical therapy as well as continuing with a home exercise program.  She is taking pain medications as well. She did get a 50% relief with the L3-L4 medial branch and L5 dorsal ramus injections would recommend repeat next month.  If she only gets a short-term relief with that I would recommend radiofrequency neurotomy of the same nerves starting with the left side

## 2018-11-23 NOTE — Patient Instructions (Signed)
Will repeat L3-4-5 medial branch blocks next month

## 2018-12-15 ENCOUNTER — Encounter: Payer: Medicare HMO | Attending: Physical Medicine & Rehabilitation | Admitting: Physical Medicine & Rehabilitation

## 2018-12-15 ENCOUNTER — Encounter: Payer: Self-pay | Admitting: Physical Medicine & Rehabilitation

## 2018-12-15 ENCOUNTER — Other Ambulatory Visit: Payer: Self-pay

## 2018-12-15 VITALS — BP 142/89 | HR 60 | Temp 97.0°F | Ht 67.0 in | Wt 161.0 lb

## 2018-12-15 DIAGNOSIS — Z79899 Other long term (current) drug therapy: Secondary | ICD-10-CM | POA: Diagnosis not present

## 2018-12-15 DIAGNOSIS — Z5181 Encounter for therapeutic drug level monitoring: Secondary | ICD-10-CM | POA: Diagnosis not present

## 2018-12-15 DIAGNOSIS — M47816 Spondylosis without myelopathy or radiculopathy, lumbar region: Secondary | ICD-10-CM | POA: Diagnosis not present

## 2018-12-15 NOTE — Progress Notes (Signed)
Bilateral Lumbar L3, L4  medial branch blocks and L 5 dorsal ramus injection under fluoroscopic guidance  Indication: Lumbar pain which is not relieved by medication management or other conservative care and interfering with self-care and mobility.  Informed consent was obtained after describing risks and benefits of the procedure with the patient, this includes bleeding, infection, paralysis and medication side effects.  The patient wishes to proceed and has given written consent.  The patient was placed in prone position.  The lumbar area was marked and prepped with Betadine.  One mL of 1% lidocaine was injected into each of 6 areas into the skin and subcutaneous tissue.  Then a 22-gauge 3.5in spinal needle was inserted targeting the junction of the left S1 superior articular process and sacral ala junction. Needle was advanced under fluoroscopic guidance.  Bone contact was made.  Isovue 200 was injected x 0.5 mL demonstrating no intravascular uptake.  Then a solution  of 2% MPF lidocaine was injected x 0.5 mL.  Then the left L5 superior articular process in transverse process junction was targeted.  Bone contact was made.  Isovue 200 was injected x 0.5 mL demonstrating no intravascular uptake. Then a solution containing  2% MPF lidocaine was injected x 0.5 mL.  Then the left L4 superior articular process in transverse process junction was targeted.  Bone contact was made.  Isovue 200 was injected x 0.5 mL demonstrating no intravascular uptake.  Then a solution containing2% MPF lidocaine was injected x 0.5 mL.  This same procedure was performed on the right side using the same needle, technique and injectate.  Patient tolerated procedure well.  Post procedure instructions were given.  

## 2018-12-15 NOTE — Progress Notes (Signed)
  PROCEDURE RECORD [facility]   Name: [name] DOB:[birth year] MRN: [id number]  Date:12/15/2018  Physician: Alysia Penna, MD    Nurse/CMA: Quillan Whitter, CMA   Allergies:  Allergies  Allergen Reactions  . Augmentin [Amoxicillin-Pot Clavulanate] Nausea And Vomiting  . Shellfish Allergy Hives and Swelling  . Ibuprofen Hives and Swelling  . Moxifloxacin Hcl Hives  . Unasyn [Ampicillin-Sulbactam Sodium] Hives and Swelling    Consent Signed: No.  Is patient diabetic? No.  CBG today?  Pregnant: No. LMP: No LMP recorded. Patient has had a hysterectomy. (age [age])  Anticoagulants: no Anti-inflammatory: no Antibiotics: no  Procedure: bilateral L3,4,5   medial branch block  Position: Prone Start Time: 10:25am  End Time: 10:36am  Fluoro Time: 43s  RN/CMA Yoshiko Keleher, CMA Harrison Zetina, CMA    Time 10:10am 10:41am    BP 142/89 156/89    Pulse 60 61    Respirations 14 14    O2 Sat 98 96    S/S 6 6    Pain Level 7/10 4/10     D/C home with friend, patient A & O X 3, D/C instructions reviewed, and sits independently.

## 2018-12-15 NOTE — Patient Instructions (Signed)

## 2019-01-12 ENCOUNTER — Encounter (HOSPITAL_COMMUNITY): Payer: Self-pay | Admitting: Physical Therapy

## 2019-01-16 DIAGNOSIS — Z6825 Body mass index (BMI) 25.0-25.9, adult: Secondary | ICD-10-CM | POA: Diagnosis not present

## 2019-01-16 DIAGNOSIS — J069 Acute upper respiratory infection, unspecified: Secondary | ICD-10-CM | POA: Diagnosis not present

## 2019-01-16 DIAGNOSIS — E663 Overweight: Secondary | ICD-10-CM | POA: Diagnosis not present

## 2019-01-26 ENCOUNTER — Other Ambulatory Visit: Payer: Self-pay

## 2019-01-26 ENCOUNTER — Encounter: Payer: Self-pay | Admitting: Physical Medicine & Rehabilitation

## 2019-01-26 ENCOUNTER — Encounter: Payer: Medicare HMO | Attending: Physical Medicine & Rehabilitation | Admitting: Physical Medicine & Rehabilitation

## 2019-01-26 VITALS — BP 129/86 | HR 81 | Temp 98.3°F | Ht 67.0 in | Wt 163.0 lb

## 2019-01-26 DIAGNOSIS — M25552 Pain in left hip: Secondary | ICD-10-CM

## 2019-01-26 DIAGNOSIS — M47816 Spondylosis without myelopathy or radiculopathy, lumbar region: Secondary | ICD-10-CM | POA: Insufficient documentation

## 2019-01-26 DIAGNOSIS — Z79899 Other long term (current) drug therapy: Secondary | ICD-10-CM | POA: Insufficient documentation

## 2019-01-26 DIAGNOSIS — Z5181 Encounter for therapeutic drug level monitoring: Secondary | ICD-10-CM | POA: Diagnosis not present

## 2019-01-26 DIAGNOSIS — G8929 Other chronic pain: Secondary | ICD-10-CM | POA: Diagnosis not present

## 2019-01-26 NOTE — Patient Instructions (Addendum)
We could do an epidural to see if the shooting pain down the Left leg can be relieved  You likely have a combination of arthrits of the spine and hips as well as Lumbar disc problems  Another posssibility is the sacroiliac joint which can also be injected if needed  Cont home exercises, walking outdoors  Will see you in 3 months  Please call if you would like to schedule a L 5 epidural injection

## 2019-01-26 NOTE — Progress Notes (Signed)
Subjective:    Patient ID: [name], female    DOB: [birth year], 54 y.o.   MRN: [id number]  HPI  Left sided low back and tingling pain down the Left LE down to the foot Symptoms have not progressed with time Has constipation  Takes baclofen for migraines Pt is hypersensitive to smell MRI Lumbar 2017 showed some foraminal stenosis on the RIght side   Second set of MBB failed to relieve pain by 50% in the lumbar spine.  Both hips are hurting equally  Pain Inventory Average Pain 6 Pain Right Now 5 My pain is sharp, stabbing and tingling  In the last 24 hours, has pain interfered with the following? General activity 5 Relation with others 3 Enjoyment of life 4 What TIME of day is your pain at its worst? all Sleep (in general) Fair  Pain is worse with: walking, bending and standing Pain improves with: medication Relief from Meds: 4  Mobility walk without assistance ability to climb steps?  yes do you drive?  yes  Function disabled: date disabled . I need assistance with the following:  meal prep, household duties and shopping  Neuro/Psych No problems in this area  Prior Studies Any changes since last visit?  no  Physicians involved in your care Any changes since last visit?  no   No family history on file. Social History   Socioeconomic History  . Marital status: Widowed    Spouse name: Not on file  . Number of children: Not on file  . Years of education: Not on file  . Highest education level: Not on file  Occupational History  . Not on file  Tobacco Use  . Smoking status: Current Every Day Smoker    Packs/day: 0.50    Types: Cigarettes  . Smokeless tobacco: Never Used  Substance and Sexual Activity  . Alcohol use: No  . Drug use: No  . Sexual activity: Not on file  Other Topics Concern  . Not on file  Social History Narrative  . Not on file   Social Determinants of Health   Financial Resource Strain:   . Difficulty of Paying Living  Expenses: Not on file  Food Insecurity:   . Worried About Charity fundraiser in the Last Year: Not on file  . Ran Out of Food in the Last Year: Not on file  Transportation Needs:   . Lack of Transportation (Medical): Not on file  . Lack of Transportation (Non-Medical): Not on file  Physical Activity:   . Days of Exercise per Week: Not on file  . Minutes of Exercise per Session: Not on file  Stress:   . Feeling of Stress : Not on file  Social Connections:   . Frequency of Communication with Friends and Family: Not on file  . Frequency of Social Gatherings with Friends and Family: Not on file  . Attends Religious Services: Not on file  . Active Member of Clubs or Organizations: Not on file  . Attends Archivist Meetings: Not on file  . Marital Status: Not on file   Past Surgical History:  Procedure Laterality Date  . ABDOMINAL SURGERY    . APPENDECTOMY    . CHOLECYSTECTOMY    . COLONOSCOPY N/A 02/01/2014   2 SIMPLE ADNEOMA(HF), HYEPRPLASTIC RECTAL POLYPS  . DILATION AND CURETTAGE OF UTERUS    . NECK SURGERY    . PARTIAL HYSTERECTOMY     Past Medical History:  Diagnosis Date  .  Degenerative disk disease   . GERD (gastroesophageal reflux disease)   . Hypertension    BP 129/86   Pulse 81   Temp 98.3 F (36.8 C)   Ht 5\' 7"  (1.702 m)   Wt 163 lb (73.9 kg)   SpO2 95%   BMI 25.53 kg/m   Opioid Risk Score:   Fall Risk Score:  `1  Depression screen PHQ 2/9  Depression screen [facility] 2/9 02/17/2017 11/07/2015  Decreased Interest 2 2  Down, Depressed, Hopeless 1 1  PHQ - 2 Score 3 3  Altered sleeping - 3  Tired, decreased energy - 2  Change in appetite - 2  Feeling bad or failure about yourself  - 0  Trouble concentrating - 1  Moving slowly or fidgety/restless - 2  Suicidal thoughts - 0  PHQ-9 Score - 13     Review of Systems  Constitutional: Negative.   HENT: Negative.   Eyes: Negative.   Respiratory: Negative.   Cardiovascular: Negative.    Gastrointestinal: Negative.   Endocrine: Negative.   Genitourinary: Negative.   Musculoskeletal: Positive for arthralgias and back pain.  Skin: Negative.   Allergic/Immunologic: Negative.   Neurological: Negative.   Hematological: Negative.   Psychiatric/Behavioral: Negative.   All other systems reviewed and are negative.      Objective:   Physical Exam Vitals and nursing note reviewed.  Constitutional:      Appearance: Normal appearance.  Eyes:     Extraocular Movements: Extraocular movements intact.     Pupils: Pupils are equal, round, and reactive to light.  Neurological:     General: No focal deficit present.     Mental Status: She is alert and oriented to person, place, and time.  Psychiatric:        Mood and Affect: Mood normal.        Behavior: Behavior normal.   Mild PSIS tenderness left greater than right side. Negative straight leg raising  Motor strength is 5/5 bilateral hip flexor knee extensor ankle dorsiflexor Left hip has good range of motion internal rotation external rotation Right hip has mildly limited internal and external rotation.  Faber's causes groin pain bilaterally        Assessment & Plan:  #1.  Bilateral hip osteoarthritis mild to moderate at this point not very symptomatic would not need to repeat intra-articular injections 2.  Lumbosacral spondylosis but does not have consistent pain relief with medial branch blocks will not pursue radiofrequency neurotomy 3.  Chronic low back pain sacroiliac pain is in differential as is discogenic pain.  She does have some left lower extremity radicular symptoms however the MRI from 2017 did not demonstrate this. We discussed epidural injection to see if left lower extremity pain can be relieved.  We also discussed sacroiliac joint injection for left-sided low back pain.  At this point the patient would like to hold off and will follow up with her in 3 months  She is using tramadol but on a very  occasional basis about half a tablet once a day as needed not taken this in 2 weeks

## 2019-02-04 DIAGNOSIS — M1991 Primary osteoarthritis, unspecified site: Secondary | ICD-10-CM | POA: Diagnosis not present

## 2019-02-04 DIAGNOSIS — E7849 Other hyperlipidemia: Secondary | ICD-10-CM | POA: Diagnosis not present

## 2019-02-04 DIAGNOSIS — I10 Essential (primary) hypertension: Secondary | ICD-10-CM | POA: Diagnosis not present

## 2019-02-06 ENCOUNTER — Encounter: Payer: Self-pay | Admitting: Gastroenterology

## 2019-03-04 DIAGNOSIS — I10 Essential (primary) hypertension: Secondary | ICD-10-CM | POA: Diagnosis not present

## 2019-03-04 DIAGNOSIS — E7849 Other hyperlipidemia: Secondary | ICD-10-CM | POA: Diagnosis not present

## 2019-03-04 DIAGNOSIS — M1991 Primary osteoarthritis, unspecified site: Secondary | ICD-10-CM | POA: Diagnosis not present

## 2019-03-26 ENCOUNTER — Encounter (HOSPITAL_COMMUNITY): Payer: Self-pay | Admitting: Emergency Medicine

## 2019-03-26 ENCOUNTER — Emergency Department (HOSPITAL_COMMUNITY)
Admission: EM | Admit: 2019-03-26 | Discharge: 2019-03-26 | Disposition: A | Discharge status: Home / Self Care | Payer: Medicare HMO | Attending: Emergency Medicine | Admitting: Emergency Medicine

## 2019-03-26 ENCOUNTER — Other Ambulatory Visit: Payer: Self-pay

## 2019-03-26 ENCOUNTER — Emergency Department (HOSPITAL_COMMUNITY): Payer: Medicare HMO

## 2019-03-26 DIAGNOSIS — S67190A Crushing injury of right index finger, initial encounter: Secondary | ICD-10-CM | POA: Diagnosis not present

## 2019-03-26 DIAGNOSIS — Z9049 Acquired absence of other specified parts of digestive tract: Secondary | ICD-10-CM | POA: Insufficient documentation

## 2019-03-26 DIAGNOSIS — F17210 Nicotine dependence, cigarettes, uncomplicated: Secondary | ICD-10-CM | POA: Diagnosis not present

## 2019-03-26 DIAGNOSIS — W208XXA Other cause of strike by thrown, projected or falling object, initial encounter: Secondary | ICD-10-CM | POA: Diagnosis not present

## 2019-03-26 DIAGNOSIS — S61210A Laceration without foreign body of right index finger without damage to nail, initial encounter: Secondary | ICD-10-CM | POA: Diagnosis not present

## 2019-03-26 DIAGNOSIS — Y999 Unspecified external cause status: Secondary | ICD-10-CM | POA: Diagnosis not present

## 2019-03-26 DIAGNOSIS — I10 Essential (primary) hypertension: Secondary | ICD-10-CM | POA: Insufficient documentation

## 2019-03-26 DIAGNOSIS — Z23 Encounter for immunization: Secondary | ICD-10-CM | POA: Insufficient documentation

## 2019-03-26 DIAGNOSIS — Y93H9 Activity, other involving exterior property and land maintenance, building and construction: Secondary | ICD-10-CM | POA: Diagnosis not present

## 2019-03-26 DIAGNOSIS — Y92007 Garden or yard of unspecified non-institutional (private) residence as the place of occurrence of the external cause: Secondary | ICD-10-CM | POA: Insufficient documentation

## 2019-03-26 DIAGNOSIS — M7989 Other specified soft tissue disorders: Secondary | ICD-10-CM | POA: Diagnosis not present

## 2019-03-26 DIAGNOSIS — S6991XA Unspecified injury of right wrist, hand and finger(s), initial encounter: Secondary | ICD-10-CM | POA: Diagnosis present

## 2019-03-26 MED ORDER — CEPHALEXIN 500 MG PO CAPS: 500.0000 mg | ORAL_CAPSULE | Freq: Four times a day (QID) | ORAL | 0 refills | Status: AC

## 2019-03-26 MED ORDER — ACETAMINOPHEN 325 MG PO TABS
650.0000 mg | ORAL_TABLET | Freq: Once | ORAL | Status: AC
  Administered 2019-03-26: 650 mg via ORAL
  Filled 2019-03-26: qty 2

## 2019-03-26 MED ORDER — TETANUS-DIPHTH-ACELL PERTUSSIS 5-2.5-18.5 LF-MCG/0.5 IM SUSP
0.5000 mL | Freq: Once | INTRAMUSCULAR | Status: AC
  Administered 2019-03-26: 0.5 mL via INTRAMUSCULAR
  Filled 2019-03-26: qty 0.5

## 2019-03-26 MED ORDER — LIDOCAINE HCL (PF) 1 % IJ SOLN
30.0000 mL | Freq: Once | INTRAMUSCULAR | Status: DC
  Filled 2019-03-26: qty 30

## 2019-03-26 MED ORDER — POVIDONE-IODINE 10 % EX SOLN
CUTANEOUS | Status: AC
  Filled 2019-03-26: qty 15

## 2019-03-26 MED ORDER — CEPHALEXIN 500 MG PO CAPS
500.0000 mg | ORAL_CAPSULE | Freq: Once | ORAL | Status: AC
  Administered 2019-03-26: 16:00:00 500 mg via ORAL
  Filled 2019-03-26: qty 1

## 2019-03-26 NOTE — Discharge Instructions (Signed)
1. Medications:  -Antibiotic sent to your pharmacy called Keflex.  Please take this as prescribed, it is used to treat infection Recommend Tylenol for pain, continue usual home medications  2. Treatment: ice for swelling, keep wound clean with Dial soap and water and keep bandage dry, do not submerge in water for 24 hours -Dial soap is recommended for wounds because it is an anti-bacterial -wear the splint to help keep your finger straight so you do not bend it and open the wound more  3. Follow Up: Please follow-up with the on-call hand doctor Dr. Caralyn Guile.  Call his office first thing tomorrow morning and ask for the next available appointment for an emergency department follow-up visit.   -Your sutures need to be removed in 7 days.  This can be done by Dr. Caralyn Guile, your primary care doctor or you can return to the emergency department to have this done.   Return to the emergency department for increased redness, drainage of pus from the wound   WOUND CARE  Keep area clean and dry for 24 hours. Do not remove bandage, if applied.  After 24 hours, remove bandage and wash wound gently with mild soap and warm water. Reapply a new bandage after cleaning wound, if directed.   Continue daily cleansing with soap and water until stitches/staples are removed.  Do not apply any ointments or creams to the wound while stitches/staples are in place, as this may cause delayed healing. Return if you experience any of the following signs of infection: Swelling, redness, pus drainage, streaking, fever >101.0 F  Return if you experience excessive bleeding that does not stop after 15-20 minutes of constant, firm pressure.

## 2019-03-26 NOTE — ED Triage Notes (Signed)
Pt reports laceration on right hand after a "branch" hit it while working with wood this am. Large laceration noted to right index finger. Bleeding noted to site and controlled at this time. Distal pulses strong intact in right hand/wrist. Pt denies being on blood thinner.

## 2019-03-26 NOTE — ED Provider Notes (Signed)
[facility] EMERGENCY DEPARTMENT Provider Note   CSN: [id number] Arrival date & time: 03/26/19  1328     History Chief Complaint  Patient presents with  . Extremity Laceration    [name] is a 54 y.o. right-hand-dominant female with past medical history significant for hypertension, GERD, degenerative disc disease presents to emergency department today with chief complaint of acute onset of right index finger duration.  She was outside working in her yard when a branch fell from a tree and landed on her right index finger.  She is endorsing mild dull aching pain localized to laceration. She also reports the finger started feeling numb while in the waiting room.  She rates the pain 2 out of 10 in severity.  Did not take any medications for symptoms prior to arrival.  Denies falling or hitting her head.  Denies pain in her right wrist or hand, myalgias, arthralgias.    Past Medical History:  Diagnosis Date  . Degenerative disk disease   . GERD (gastroesophageal reflux disease)   . Hypertension     Patient Active Problem List   Diagnosis Date Noted  . Primary osteoarthritis of left hip 11/18/2015  . Chronic left hip pain 11/07/2015  . Spondylosis without myelopathy or radiculopathy, lumbar region 11/07/2015  . Special screening for malignant neoplasms, colon   . Left leg weakness 10/02/2012  . Unstable balance 10/02/2012  . IMPINGEMENT SYNDROME 07/28/2009  . SHOULDER STRAIN, LEFT 07/28/2009    Past Surgical History:  Procedure Laterality Date  . ABDOMINAL SURGERY    . APPENDECTOMY    . CHOLECYSTECTOMY    . COLONOSCOPY N/A 02/01/2014   2 SIMPLE ADNEOMA(HF), HYEPRPLASTIC RECTAL POLYPS  . DILATION AND CURETTAGE OF UTERUS    . NECK SURGERY    . PARTIAL HYSTERECTOMY       OB History    Gravida      Para      Term      Preterm      AB      Living  2     SAB      TAB      Ectopic      Multiple      Live Births              History reviewed. No  pertinent family history.  Social History   Tobacco Use  . Smoking status: Current Every Day Smoker    Packs/day: 0.50    Types: Cigarettes  . Smokeless tobacco: Never Used  Substance Use Topics  . Alcohol use: No  . Drug use: No    Home Medications Prior to Admission medications   Medication Sig Start Date End Date Taking? Authorizing Provider  baclofen (LIORESAL) 10 MG tablet Take 10 mg by mouth 1 day or 1 dose. 09/29/15   [provider]  cephALEXin (KEFLEX) 500 MG capsule Take 1 capsule (500 mg total) by mouth 4 (four) times daily for 7 days. 03/26/19 04/02/19  Braheem Tomasik, Harley Hallmark, PA-C  hydrochlorothiazide (HYDRODIURIL) 25 MG tablet  09/15/15   [provider]  meclizine (ANTIVERT) 50 MG tablet Take 0.5 tablets (25 mg total) by mouth 3 (three) times daily as needed for dizziness or nausea. 02/16/15   Dorie Rank, MD  traMADol (ULTRAM) 50 MG tablet Take 0.5 tablets (25 mg total) by mouth every 6 (six) hours as needed. 11/23/18   Kirsteins, Luanna Salk, MD    Allergies    Augmentin [amoxicillin-pot clavulanate], Shellfish allergy,  Ibuprofen, Moxifloxacin hcl, and Unasyn [ampicillin-sulbactam sodium]  Review of Systems   Review of Systems All other systems are reviewed and are negative for acute change except as noted in the HPI.  Physical Exam Updated Vital Signs BP (!) 144/90 (BP Location: Left Arm)   Pulse 77   Temp 98.4 F (36.9 C) (Oral)   Resp 18   Ht 5\' 7"  (1.702 m)   Wt 73.9 kg   SpO2 97%   BMI 25.53 kg/m   Physical Exam Vitals and nursing note reviewed.  Constitutional:      Appearance: She is well-developed. She is not ill-appearing or toxic-appearing.  HENT:     Head: Normocephalic and atraumatic.     Nose: Nose normal.  Eyes:     General: No scleral icterus.       Right eye: No discharge.        Left eye: No discharge.     Conjunctiva/sclera: Conjunctivae normal.  Neck:     Vascular: No JVD.  Cardiovascular:     Rate and Rhythm: Normal  rate and regular rhythm.     Pulses: Normal pulses.          Radial pulses are 2+ on the right side.     Heart sounds: Normal heart sounds.  Pulmonary:     Effort: Pulmonary effort is normal.     Breath sounds: Normal breath sounds.  Abdominal:     General: There is no distension.  Musculoskeletal:        General: Normal range of motion.     Cervical back: Normal range of motion.     Comments: Normal ROM of right wrist and elbow. Able to wiggle all fingers without difficulty. Brisk cap refill. Neurovascularly intact distally. Compartments soft above and below affected joint.    Skin:    General: Skin is warm and dry.     Comments: 4 cm laceration to dorsal aspect of right index finger. There is swelling noted over proximal phalanx. Bleeding controlled with pressure   Strength intact against resistance. Decreased sensation to dorsal aspect of right index finger.   Neurological:     Mental Status: She is oriented to person, place, and time.     GCS: GCS eye subscore is 4. GCS verbal subscore is 5. GCS motor subscore is 6.     Comments: Fluent speech, no facial droop.  Psychiatric:        Behavior: Behavior normal.       ED Results / Procedures / Treatments   Labs (all labs ordered are listed, but only abnormal results are displayed) Labs Reviewed - No data to display  EKG None  Radiology DG Finger Index Right  Result Date: 03/26/2019 CLINICAL DATA:  Recent crush injury to the second digit, initial encounter EXAM: RIGHT INDEX FINGER 2+V COMPARISON:  None. FINDINGS: Considerable soft tissue swelling is noted in the proximal aspect of the second digit. No definitive radiopaque foreign body is seen. No acute fracture is noted. Air is noted in the subcutaneous tissues related to the recent injury. IMPRESSION: Soft tissue swelling and subcutaneous air consistent with the recent injury. No acute bony abnormality is noted. Electronically Signed   By: Inez Catalina M.D.   On: 03/26/2019  14:20    Procedures .Marland KitchenLaceration Repair  Date/Time: 03/26/2019 4:32 PM Performed by: Cherre Robins, PA-C Authorized by: Cherre Robins, PA-C   Consent:    Consent obtained:  Verbal   Consent given by:  Patient  Risks discussed:  Infection, pain, retained foreign body, nerve damage and poor wound healing   Alternatives discussed:  No treatment Anesthesia (see MAR for exact dosages):    Anesthesia method:  None Laceration details:    Location:  Finger   Finger location:  R index finger   Length (cm):  4   Depth (mm):  1 Pre-procedure details:    Preparation:  Patient was prepped and draped in usual sterile fashion and imaging obtained to evaluate for foreign bodies Exploration:    Hemostasis achieved with:  Direct pressure   Wound exploration: wound explored through full range of motion and entire depth of wound probed and visualized     Wound extent: nerve damage     Wound extent: no foreign bodies/material noted and no underlying fracture noted   Treatment:    Area cleansed with:  Saline   Amount of cleaning:  Standard   Irrigation solution:  Sterile saline   Irrigation volume:  1000 ml   Irrigation method:  Syringe   Visualized foreign bodies/material removed: no   Skin repair:    Repair method:  Sutures   Suture size:  4-0   Suture material:  Prolene   Suture technique:  Simple interrupted   Number of sutures:  3 Approximation:    Approximation:  Loose Post-procedure details:    Dressing:  Non-adherent dressing and splint for protection   Patient tolerance of procedure:  Tolerated well, no immediate complications Comments:     Patient has little to no sensation in the finger and for that reason did not require lidocaine for anesthesia   (including critical care time)  Medications Ordered in ED Medications  lidocaine (PF) (XYLOCAINE) 1 % injection 30 mL (30 mLs Infiltration Refused 03/26/19 1617)  povidone-iodine (BETADINE) 10 % external solution (has  no administration in time range)  Tdap (BOOSTRIX) injection 0.5 mL (0.5 mLs Intramuscular Given 03/26/19 1430)  acetaminophen (TYLENOL) tablet 650 mg (650 mg Oral Given 03/26/19 1430)  cephALEXin (KEFLEX) capsule 500 mg (500 mg Oral Given 03/26/19 1611)    ED Course  I have reviewed the triage vital signs and the nursing notes.  Pertinent labs & imaging results that were available during my care of the patient were reviewed by me and considered in my medical decision making (see chart for details).    MDM Rules/Calculators/A&P                      Patient presents to the emergency department with laceration to right index finger  which occurred within 20 minutes  PTA. Patient nontoxic appearing, resting comfortably. X-ray obtained in area of laceration, no fractures/dislocations or apparent radiopaque foreign bodies. Pressure irrigation performed. Wound explored and base of wound visualized in a bloodless field without evidence of foreign body. Laceration repair per procedure note above, tolerated well. Loose suture repair as wound unable for close closure with the amount of swelling. Tetanus updated at today's visit. Antibiotics given as we cannot fully close wound today.   Discussed suture home care as well as need for wound recheck in 2 days  and suture removal in 7 days. Patient will need to follow up with hand surgeon for definitive treatment. I discussed results, treatment plan, need for follow-up, and return precautions with the patient including signs of infection. Provided opportunity for questions, patient confirmed understanding and is in agreement with plan. The patient was discussed with and seen by Dr. Laverta Baltimore who agrees with the treatment  plan.   Portions of this note were generated with Dragon dictation software. Dictation errors may occur despite best attempts at proofreading.    Final Clinical Impression(s) / ED Diagnoses Final diagnoses:  Laceration of right index finger  without foreign body without damage to nail, initial encounter    Rx / DC Orders ED Discharge Orders         Ordered    cephALEXin (KEFLEX) 500 MG capsule  4 times daily     03/26/19 1607           Flint Melter 03/26/19 1643    LongWonda Olds, MD 03/26/19 [phone]

## 2019-03-28 DIAGNOSIS — Z6825 Body mass index (BMI) 25.0-25.9, adult: Secondary | ICD-10-CM | POA: Diagnosis not present

## 2019-03-28 DIAGNOSIS — S61211D Laceration without foreign body of left index finger without damage to nail, subsequent encounter: Secondary | ICD-10-CM | POA: Diagnosis not present

## 2019-03-28 DIAGNOSIS — E663 Overweight: Secondary | ICD-10-CM | POA: Diagnosis not present

## 2019-03-30 DIAGNOSIS — S61210A Laceration without foreign body of right index finger without damage to nail, initial encounter: Secondary | ICD-10-CM | POA: Diagnosis not present

## 2019-04-06 DIAGNOSIS — S61210D Laceration without foreign body of right index finger without damage to nail, subsequent encounter: Secondary | ICD-10-CM | POA: Diagnosis not present

## 2019-04-10 DIAGNOSIS — E7849 Other hyperlipidemia: Secondary | ICD-10-CM | POA: Diagnosis not present

## 2019-04-10 DIAGNOSIS — Z0001 Encounter for general adult medical examination with abnormal findings: Secondary | ICD-10-CM | POA: Diagnosis not present

## 2019-04-10 DIAGNOSIS — Z1389 Encounter for screening for other disorder: Secondary | ICD-10-CM | POA: Diagnosis not present

## 2019-04-10 DIAGNOSIS — I10 Essential (primary) hypertension: Secondary | ICD-10-CM | POA: Diagnosis not present

## 2019-04-10 DIAGNOSIS — R7309 Other abnormal glucose: Secondary | ICD-10-CM | POA: Diagnosis not present

## 2019-04-10 DIAGNOSIS — D509 Iron deficiency anemia, unspecified: Secondary | ICD-10-CM | POA: Diagnosis not present

## 2019-04-10 DIAGNOSIS — M150 Primary generalized (osteo)arthritis: Secondary | ICD-10-CM | POA: Diagnosis not present

## 2019-04-10 DIAGNOSIS — R945 Abnormal results of liver function studies: Secondary | ICD-10-CM | POA: Diagnosis not present

## 2019-04-10 DIAGNOSIS — Z6825 Body mass index (BMI) 25.0-25.9, adult: Secondary | ICD-10-CM | POA: Diagnosis not present

## 2019-04-26 ENCOUNTER — Ambulatory Visit: Payer: Medicare HMO | Admitting: Physical Medicine & Rehabilitation

## 2019-04-27 DIAGNOSIS — S61210D Laceration without foreign body of right index finger without damage to nail, subsequent encounter: Secondary | ICD-10-CM | POA: Diagnosis not present

## 2019-05-01 ENCOUNTER — Encounter: Payer: Medicare HMO | Admitting: Physical Medicine & Rehabilitation

## 2019-05-04 DIAGNOSIS — I10 Essential (primary) hypertension: Secondary | ICD-10-CM | POA: Diagnosis not present

## 2019-05-04 DIAGNOSIS — M1991 Primary osteoarthritis, unspecified site: Secondary | ICD-10-CM | POA: Diagnosis not present

## 2019-05-04 DIAGNOSIS — E7849 Other hyperlipidemia: Secondary | ICD-10-CM | POA: Diagnosis not present

## 2019-05-11 ENCOUNTER — Encounter: Payer: Self-pay | Admitting: Physical Medicine & Rehabilitation

## 2019-05-11 ENCOUNTER — Other Ambulatory Visit: Payer: Self-pay

## 2019-05-11 ENCOUNTER — Encounter: Payer: Medicare HMO | Attending: Physical Medicine & Rehabilitation | Admitting: Physical Medicine & Rehabilitation

## 2019-05-11 VITALS — BP 122/85 | HR 63 | Temp 97.2°F | Ht 67.0 in | Wt 166.0 lb

## 2019-05-11 DIAGNOSIS — M25552 Pain in left hip: Secondary | ICD-10-CM | POA: Insufficient documentation

## 2019-05-11 DIAGNOSIS — M47816 Spondylosis without myelopathy or radiculopathy, lumbar region: Secondary | ICD-10-CM | POA: Diagnosis not present

## 2019-05-11 DIAGNOSIS — G8929 Other chronic pain: Secondary | ICD-10-CM | POA: Diagnosis not present

## 2019-05-11 MED ORDER — TRAMADOL HCL 50 MG PO TABS: 25.0000 mg | ORAL_TABLET | Freq: Four times a day (QID) | ORAL | 0 refills | Status: DC | PRN

## 2019-05-11 NOTE — Patient Instructions (Signed)
Please call if you need another injection for back or left hip

## 2019-05-11 NOTE — Progress Notes (Signed)
Subjective:    Patient ID: [name], female    DOB: 09-21-1965, 54 y.o.   MRN: [id number]  HPI CC:  Moderate back and left hip pain  54 year old female with history of lumbar spondylosis without myelopathy causing chronic low back pain she has benefited from lumbar medial branch blocks L3-L4 and L5 dorsal ramus injection bilaterally last performed in December 2020.  She gets a prolonged response to these blocks and radiofrequency neurotomy of these nerves has not been needed. In addition patient has left hip osteoarthritis and has undergone left hip intra-articular injection under  guidance last performed July 2019 Starting to plant garden Independent with all self-care and mobility, does not utilize assistive device for ambulation Pain Inventory Average Pain 5 Pain Right Now 4 My pain is sharp, stabbing, tingling and aching  In the last 24 hours, has pain interfered with the following? General activity 4 Relation with others 4 Enjoyment of life 4 What TIME of day is your pain at its worst? all Sleep (in general) Fair  Pain is worse with: bending, inactivity and standing Pain improves with: n/a Relief from Meds: no pain meds currently  Mobility walk without assistance how many minutes can you walk? 20 ability to climb steps?  yes do you drive?  yes  Function disabled: date disabled . I need assistance with the following:  meal prep, household duties and shopping Do you have any goals in this area?  yes  Neuro/Psych No problems in this area  Prior Studies Any changes since last visit?  no  Physicians involved in your care Any changes since last visit?  no   History reviewed. No pertinent family history. Social History   Socioeconomic History  . Marital status: Widowed    Spouse name: Not on file  . Number of children: Not on file  . Years of education: Not on file  . Highest education level: Not on file  Occupational History  . Not on file  Tobacco  Use  . Smoking status: Current Every Day Smoker    Packs/day: 0.50    Types: Cigarettes  . Smokeless tobacco: Never Used  Substance and Sexual Activity  . Alcohol use: No  . Drug use: No  . Sexual activity: Not on file  Other Topics Concern  . Not on file  Social History Narrative  . Not on file   Social Determinants of Health   Financial Resource Strain:   . Difficulty of Paying Living Expenses:   Food Insecurity:   . Worried About Charity fundraiser in the Last Year:   . Arboriculturist in the Last Year:   Transportation Needs:   . Film/video editor (Medical):   Marland Kitchen Lack of Transportation (Non-Medical):   Physical Activity:   . Days of Exercise per Week:   . Minutes of Exercise per Session:   Stress:   . Feeling of Stress :   Social Connections:   . Frequency of Communication with Friends and Family:   . Frequency of Social Gatherings with Friends and Family:   . Attends Religious Services:   . Active Member of Clubs or Organizations:   . Attends Archivist Meetings:   Marland Kitchen Marital Status:    Past Surgical History:  Procedure Laterality Date  . ABDOMINAL SURGERY    . APPENDECTOMY    . CHOLECYSTECTOMY    . COLONOSCOPY N/A 02/01/2014   2 SIMPLE ADNEOMA(HF), HYEPRPLASTIC RECTAL POLYPS  . DILATION AND CURETTAGE  OF UTERUS    . NECK SURGERY    . PARTIAL HYSTERECTOMY     Past Medical History:  Diagnosis Date  . Degenerative disk disease   . GERD (gastroesophageal reflux disease)   . Hypertension    BP 122/85   Pulse 63   Temp (!) 97.2 F (36.2 C)   Ht 5\' 7"  (1.702 m)   Wt 166 lb (75.3 kg)   SpO2 97%   BMI 26.00 kg/m   Opioid Risk Score:   Fall Risk Score:  `1  Depression screen PHQ 2/9  Depression screen [facility] 2/9 02/17/2017 11/07/2015  Decreased Interest 2 2  Down, Depressed, Hopeless 1 1  PHQ - 2 Score 3 3  Altered sleeping - 3  Tired, decreased energy - 2  Change in appetite - 2  Feeling bad or failure about yourself  - 0  Trouble  concentrating - 1  Moving slowly or fidgety/restless - 2  Suicidal thoughts - 0  PHQ-9 Score - 13    Review of Systems  Constitutional: Negative.   HENT: Negative.   Eyes: Negative.   Respiratory: Negative.   Cardiovascular: Negative.   Gastrointestinal: Negative.   Endocrine: Negative.   Genitourinary: Negative.   Musculoskeletal: Positive for arthralgias and back pain.  Skin: Negative.   Allergic/Immunologic: Negative.   Neurological: Negative.   Hematological: Negative.   Psychiatric/Behavioral: Negative.   All other systems reviewed and are negative.      Objective:   Physical Exam Vitals and nursing note reviewed.  Constitutional:      Appearance: Normal appearance.  Eyes:     Extraocular Movements: Extraocular movements intact.     Conjunctiva/sclera: Conjunctivae normal.     Pupils: Pupils are equal, round, and reactive to light.  Musculoskeletal:     Thoracic back: Normal.     Lumbar back: Tenderness present. No edema or deformity. Normal range of motion. Negative right straight leg raise test and negative left straight leg raise test.     Right hip: Normal. Normal range of motion.     Left hip: Normal. Normal range of motion.     Comments: Back has mild tenderness right PSIS There is no pain with internal and external rotation of the left hip  Skin:    General: Skin is warm and dry.  Neurological:     Mental Status: She is alert and oriented to person, place, and time.  Psychiatric:        Mood and Affect: Mood normal.        Behavior: Behavior normal.           Assessment & Plan:  #1.  Lumbar spondylosis without myelopathy we will continue with stretching program.  She remains independent with all her self-care and mobility as well as doing quite a bit of gardening. No need for repeat medial branch blocks at this time.  She still takes tramadol but not on a daily basis she only takes 1/2 tablet as needed.  Will renew prescription for 60 tablets  should last 6 months based on her usage pattern 2.  Left hip osteoarthritis overall doing quite well with this as well no need for repeat intra-articular injection.  Patient will call if she feels like this flares up on her. Return in clinic in 6 months

## 2019-07-02 ENCOUNTER — Telehealth: Payer: Self-pay | Admitting: Physical Medicine & Rehabilitation

## 2019-07-02 NOTE — Telephone Encounter (Signed)
[name] would like an repeat injection in low back, please advise which type of injection, and time needed so I may schedule.  I will call the patient back once we are sure of which type and time needed.

## 2019-07-12 ENCOUNTER — Encounter: Payer: Medicare HMO | Attending: Physical Medicine & Rehabilitation | Admitting: Physical Medicine & Rehabilitation

## 2019-07-12 ENCOUNTER — Encounter: Payer: Self-pay | Admitting: Physical Medicine & Rehabilitation

## 2019-07-12 ENCOUNTER — Other Ambulatory Visit: Payer: Self-pay

## 2019-07-12 VITALS — BP 128/85 | HR 72 | Temp 97.8°F | Ht 67.0 in | Wt 168.2 lb

## 2019-07-12 DIAGNOSIS — M47816 Spondylosis without myelopathy or radiculopathy, lumbar region: Secondary | ICD-10-CM | POA: Insufficient documentation

## 2019-07-12 DIAGNOSIS — M5416 Radiculopathy, lumbar region: Secondary | ICD-10-CM | POA: Diagnosis not present

## 2019-07-12 DIAGNOSIS — Z5181 Encounter for therapeutic drug level monitoring: Secondary | ICD-10-CM | POA: Diagnosis not present

## 2019-07-12 DIAGNOSIS — Z79891 Long term (current) use of opiate analgesic: Secondary | ICD-10-CM | POA: Diagnosis not present

## 2019-07-12 DIAGNOSIS — M25552 Pain in left hip: Secondary | ICD-10-CM | POA: Insufficient documentation

## 2019-07-12 DIAGNOSIS — G8929 Other chronic pain: Secondary | ICD-10-CM | POA: Insufficient documentation

## 2019-07-12 NOTE — Patient Instructions (Addendum)

## 2019-07-12 NOTE — Addendum Note (Signed)
Addended by: Franchot Gallo on: 07/12/2019 01:35 PM   Modules accepted: Orders

## 2019-07-12 NOTE — Progress Notes (Signed)
  [facility]   Name: [name] DOB:April 07, 1965 MRN: [id number]  Date:07/12/2019  Physician: Alysia Penna, MD    Nurse/CMA: Jorja Loa MA  Allergies:  Allergies  Allergen Reactions  . Augmentin [Amoxicillin-Pot Clavulanate] Nausea And Vomiting  . Shellfish Allergy Hives and Swelling  . Ibuprofen Hives and Swelling  . Moxifloxacin Hcl Hives  . Unasyn [Ampicillin-Sulbactam Sodium] Hives and Swelling    Consent Signed: Yes.    Is patient diabetic? No.  CBG today? N/A  Pregnant: No. LMP: No LMP recorded. Patient has had a hysterectomy. (age 55-55)  Anticoagulants: no Anti-inflammatory: no Antibiotics: no  Procedure  Left L 4, L5 Trans lumbar Epidural streoid injection:   Position: Prone Start Time: 1:06PM   End Time: 1:10PM  Fluoro Time 22 RN/CMA Jerline Pain, MA Sonna Lipsky, MA    Time 12:47 PM 1:14 PM    BP 128/85 121/72    Pulse 72 69    Respirations 12 12    O2 Sat 96 96    S/S 6 6    Pain Level 8 6     D/C home with Kendrick Fries (Sister) Patient A & O X 3, D/C instructions reviewed, and sits independently.

## 2019-07-12 NOTE — Progress Notes (Signed)
Lumbar transforaminal epidural steroid injection under fluoroscopic guidance with contrast enhancement  Indication: Lumbosacral radiculitis is not relieved by medication management or other conservative care and interfering with self-care and mobility.   Informed consent was obtained after describing risk and benefits of the procedure with the patient, this includes bleeding, bruising, infection, paralysis and medication side effects.  The patient wishes to proceed and has given written consent.  Patient was placed in prone position.  The lumbar area was marked and prepped with Betadine.  It was entered with a 25-gauge 1-1/2 inch needle and one mL of 1% lidocaine was injected into the skin and subcutaneous tissue.  Then a 22-gauge 3.5 spinal needle was inserted into the Left L5-S1 intervertebral foramen under AP, lateral, and oblique view.  Once needle tip was within the foramen on lateral views an dnor exceeding 6 o clock position on th epedical on AP viewed Isovue 200 was inected x 68ml Then a solution containing one mL of 10 mg per mL dexamethasone and 2 mL of 1% lidocaine was injected.  The patient tolerated procedure well.  Post procedure instructions were given.  Please see post procedure form.

## 2019-07-16 LAB — DRUG TOX MONITOR 1 W/CONF, ORAL FLD
Amphetamines: NEGATIVE ng/mL (ref <10)
Barbiturates: NEGATIVE ng/mL (ref <10)
Benzodiazepines: NEGATIVE ng/mL (ref <0.50)
Buprenorphine: NEGATIVE ng/mL (ref <0.10)
Cocaine: NEGATIVE ng/mL (ref <5.0)
Cotinine: 41.8 ng/mL — ABNORMAL HIGH (ref <5.0)
Fentanyl: NEGATIVE ng/mL (ref <0.10)
Heroin Metabolite: NEGATIVE ng/mL (ref <1.0)
MARIJUANA: NEGATIVE ng/mL (ref <2.5)
MDMA: NEGATIVE ng/mL (ref <10)
Meprobamate: NEGATIVE ng/mL (ref <2.5)
Methadone: NEGATIVE ng/mL (ref <5.0)
Nicotine Metabolite: POSITIVE ng/mL — AB (ref <5.0)
Opiates: NEGATIVE ng/mL (ref <2.5)
Phencyclidine: NEGATIVE ng/mL (ref <10)
Tapentadol: NEGATIVE ng/mL (ref <5.0)
Tramadol: NEGATIVE ng/mL (ref <5.0)
Zolpidem: NEGATIVE ng/mL (ref <5.0)

## 2019-07-16 LAB — DRUG TOX ALC METAB W/CON, ORAL FLD: Alcohol Metabolite: NEGATIVE ng/mL (ref <25)

## 2019-07-18 ENCOUNTER — Telehealth: Payer: Self-pay | Admitting: *Deleted

## 2019-07-18 NOTE — Telephone Encounter (Signed)
Oral swab drug screen was consistent for prescribed medications being absent. She only takes occasionally and last taken 06/09/19

## 2019-07-24 DIAGNOSIS — M5412 Radiculopathy, cervical region: Secondary | ICD-10-CM | POA: Diagnosis not present

## 2019-07-24 DIAGNOSIS — M542 Cervicalgia: Secondary | ICD-10-CM | POA: Diagnosis not present

## 2019-07-24 DIAGNOSIS — Z6825 Body mass index (BMI) 25.0-25.9, adult: Secondary | ICD-10-CM | POA: Diagnosis not present

## 2019-07-24 DIAGNOSIS — E663 Overweight: Secondary | ICD-10-CM | POA: Diagnosis not present

## 2019-08-23 ENCOUNTER — Encounter: Payer: Self-pay | Admitting: Physical Medicine & Rehabilitation

## 2019-08-23 ENCOUNTER — Encounter: Payer: Medicare HMO | Attending: Physical Medicine & Rehabilitation | Admitting: Physical Medicine & Rehabilitation

## 2019-08-23 ENCOUNTER — Other Ambulatory Visit: Payer: Self-pay

## 2019-08-23 VITALS — BP 122/83 | HR 78 | Temp 96.7°F | Ht 67.0 in | Wt 171.0 lb

## 2019-08-23 DIAGNOSIS — Z79891 Long term (current) use of opiate analgesic: Secondary | ICD-10-CM | POA: Insufficient documentation

## 2019-08-23 DIAGNOSIS — M25552 Pain in left hip: Secondary | ICD-10-CM | POA: Insufficient documentation

## 2019-08-23 DIAGNOSIS — G8929 Other chronic pain: Secondary | ICD-10-CM | POA: Insufficient documentation

## 2019-08-23 DIAGNOSIS — M47816 Spondylosis without myelopathy or radiculopathy, lumbar region: Secondary | ICD-10-CM | POA: Diagnosis not present

## 2019-08-23 DIAGNOSIS — M1612 Unilateral primary osteoarthritis, left hip: Secondary | ICD-10-CM | POA: Diagnosis not present

## 2019-08-23 DIAGNOSIS — Z5181 Encounter for therapeutic drug level monitoring: Secondary | ICD-10-CM | POA: Diagnosis not present

## 2019-08-23 NOTE — Progress Notes (Signed)
Subjective:    Patient ID: [name], female    DOB: [birth year], 55 y.o.   MRN: [id number]  HPI  54 year old female with history of chronic low back pain.  She has had good relief with lumbar medial branch blocks L3-L4 as well as L5 dorsal ramus injection.  In addition she has a history of left hip osteoarthritis which has responded well to intra-articular injections.  She has gone through physical therapy and continues with home exercise program which she states is helping her maintain her mobility as well as reduce her pain levels No longer taking pain meds Had exacerbation of neck pain due to using a heavy hammer Oral steroids prescribed by PCP, with >10lb wt gain Doing bridges, lunges, crab, thigh abd exercise woth band around ankles Pain Inventory Average Pain 3 Pain Right Now 4 My pain is sharp, stabbing and aching  In the last 24 hours, has pain interfered with the following? General activity 2 Relation with others 2 Enjoyment of life 2 What TIME of day is your pain at its worst? morning , daytime, evening and night Sleep (in general) Fair  Pain is worse with: bending, sitting and standing Pain improves with: medication Relief from Meds: 2  Family History  Problem Relation Age of Onset  . Hypotension Mother    Social History   Socioeconomic History  . Marital status: Widowed    Spouse name: Not on file  . Number of children: Not on file  . Years of education: Not on file  . Highest education level: Not on file  Occupational History  . Not on file  Tobacco Use  . Smoking status: Current Every Day Smoker    Packs/day: 0.50    Types: Cigarettes  . Smokeless tobacco: Never Used  Substance and Sexual Activity  . Alcohol use: No  . Drug use: No  . Sexual activity: Not on file  Other Topics Concern  . Not on file  Social History Narrative  . Not on file   Social Determinants of Health   Financial Resource Strain:   . Difficulty of Paying Living  Expenses: Not on file  Food Insecurity:   . Worried About Charity fundraiser in the Last Year: Not on file  . Ran Out of Food in the Last Year: Not on file  Transportation Needs:   . Lack of Transportation (Medical): Not on file  . Lack of Transportation (Non-Medical): Not on file  Physical Activity:   . Days of Exercise per Week: Not on file  . Minutes of Exercise per Session: Not on file  Stress:   . Feeling of Stress : Not on file  Social Connections:   . Frequency of Communication with Friends and Family: Not on file  . Frequency of Social Gatherings with Friends and Family: Not on file  . Attends Religious Services: Not on file  . Active Member of Clubs or Organizations: Not on file  . Attends Archivist Meetings: Not on file  . Marital Status: Not on file   Past Surgical History:  Procedure Laterality Date  . ABDOMINAL SURGERY    . APPENDECTOMY    . CHOLECYSTECTOMY    . COLONOSCOPY N/A 02/01/2014   2 SIMPLE ADNEOMA(HF), HYEPRPLASTIC RECTAL POLYPS  . DILATION AND CURETTAGE OF UTERUS    . NECK SURGERY    . PARTIAL HYSTERECTOMY     Past Surgical History:  Procedure Laterality Date  . ABDOMINAL SURGERY    .  APPENDECTOMY    . CHOLECYSTECTOMY    . COLONOSCOPY N/A 02/01/2014   2 SIMPLE ADNEOMA(HF), HYEPRPLASTIC RECTAL POLYPS  . DILATION AND CURETTAGE OF UTERUS    . NECK SURGERY    . PARTIAL HYSTERECTOMY     Past Medical History:  Diagnosis Date  . Degenerative disk disease   . GERD (gastroesophageal reflux disease)   . Hypertension    BP 122/83   Pulse 78   Temp (!) 96.7 F (35.9 C) (Axillary)   Ht 5\' 7"  (1.702 m)   Wt 171 lb (77.6 kg)   SpO2 95%   BMI 26.78 kg/m   Opioid Risk Score:   Fall Risk Score:  `1  Depression screen PHQ 2/9  Depression screen [facility] 2/9 07/12/2019 02/17/2017 11/07/2015  Decreased Interest 0 2 2  Down, Depressed, Hopeless 0 1 1  PHQ - 2 Score 0 3 3  Altered sleeping - - 3  Tired, decreased energy - - 2  Change in appetite  - - 2  Feeling bad or failure about yourself  - - 0  Trouble concentrating - - 1  Moving slowly or fidgety/restless - - 2  Suicidal thoughts - - 0  PHQ-9 Score - - 13     Review of Systems  All other systems reviewed and are negative.      Objective:   Physical Exam Vitals and nursing note reviewed.  Constitutional:      Appearance: She is obese.  HENT:     Head: Normocephalic and atraumatic.  Eyes:     Extraocular Movements: Extraocular movements intact.     Conjunctiva/sclera: Conjunctivae normal.     Pupils: Pupils are equal, round, and reactive to light.  Neurological:     Mental Status: She is alert.    No pain with neck range of motion Lumbar spine has normal range of motion without pain.  Mild tenderness palpation around L4-L5 bilaterally. Negative straight leg raising Motor strength is 5/5 bilateral hip flexor knee extensor hydrodissection Bilateral hip range of motion internal extra rotation are normal. Gait without evidence of toe drag or knee instability.       Assessment & Plan:  1.  Lumbar spondylosis with chronic low back pain overall doing quite well with this maintaining on home exercise program.  No tramadol prescription necessary 2.  Left hip osteoarthritis doing well on current exercise regimen.  She also has a gardener.  May repeat left hip intra-articular injection under fluoroscopic guidance if there is a flareup of her left hip pain. Follow-up in 6 months patient may call before that time should she have any exacerbation of her back pain or hip pain.

## 2019-08-23 NOTE — Patient Instructions (Signed)
Please keep up with your exercises!

## 2019-10-15 DIAGNOSIS — Z0100 Encounter for examination of eyes and vision without abnormal findings: Secondary | ICD-10-CM | POA: Diagnosis not present

## 2019-10-15 DIAGNOSIS — H524 Presbyopia: Secondary | ICD-10-CM | POA: Diagnosis not present

## 2019-11-03 DIAGNOSIS — M1991 Primary osteoarthritis, unspecified site: Secondary | ICD-10-CM | POA: Diagnosis not present

## 2019-11-03 DIAGNOSIS — I10 Essential (primary) hypertension: Secondary | ICD-10-CM | POA: Diagnosis not present

## 2019-11-03 DIAGNOSIS — E7849 Other hyperlipidemia: Secondary | ICD-10-CM | POA: Diagnosis not present

## 2019-11-09 ENCOUNTER — Ambulatory Visit: Payer: Medicare HMO | Admitting: Physical Medicine & Rehabilitation

## 2019-12-04 DIAGNOSIS — I10 Essential (primary) hypertension: Secondary | ICD-10-CM | POA: Diagnosis not present

## 2019-12-04 DIAGNOSIS — E7849 Other hyperlipidemia: Secondary | ICD-10-CM | POA: Diagnosis not present

## 2019-12-04 DIAGNOSIS — M1991 Primary osteoarthritis, unspecified site: Secondary | ICD-10-CM | POA: Diagnosis not present

## 2020-01-04 DIAGNOSIS — M1991 Primary osteoarthritis, unspecified site: Secondary | ICD-10-CM | POA: Diagnosis not present

## 2020-01-04 DIAGNOSIS — E7849 Other hyperlipidemia: Secondary | ICD-10-CM | POA: Diagnosis not present

## 2020-01-04 DIAGNOSIS — I10 Essential (primary) hypertension: Secondary | ICD-10-CM | POA: Diagnosis not present

## 2020-01-17 DIAGNOSIS — J069 Acute upper respiratory infection, unspecified: Secondary | ICD-10-CM | POA: Diagnosis not present

## 2020-02-02 DIAGNOSIS — E7849 Other hyperlipidemia: Secondary | ICD-10-CM | POA: Diagnosis not present

## 2020-02-02 DIAGNOSIS — M1991 Primary osteoarthritis, unspecified site: Secondary | ICD-10-CM | POA: Diagnosis not present

## 2020-02-02 DIAGNOSIS — I10 Essential (primary) hypertension: Secondary | ICD-10-CM | POA: Diagnosis not present

## 2020-02-08 DIAGNOSIS — Z1331 Encounter for screening for depression: Secondary | ICD-10-CM | POA: Diagnosis not present

## 2020-02-08 DIAGNOSIS — E663 Overweight: Secondary | ICD-10-CM | POA: Diagnosis not present

## 2020-02-08 DIAGNOSIS — Z6826 Body mass index (BMI) 26.0-26.9, adult: Secondary | ICD-10-CM | POA: Diagnosis not present

## 2020-02-08 DIAGNOSIS — L259 Unspecified contact dermatitis, unspecified cause: Secondary | ICD-10-CM | POA: Diagnosis not present

## 2020-02-26 ENCOUNTER — Encounter: Payer: Medicare HMO | Admitting: Physical Medicine & Rehabilitation

## 2020-03-03 DIAGNOSIS — E7849 Other hyperlipidemia: Secondary | ICD-10-CM | POA: Diagnosis not present

## 2020-03-03 DIAGNOSIS — I10 Essential (primary) hypertension: Secondary | ICD-10-CM | POA: Diagnosis not present

## 2020-03-03 DIAGNOSIS — M1991 Primary osteoarthritis, unspecified site: Secondary | ICD-10-CM | POA: Diagnosis not present

## 2020-03-13 ENCOUNTER — Encounter: Payer: Self-pay | Admitting: Physical Medicine & Rehabilitation

## 2020-03-13 ENCOUNTER — Other Ambulatory Visit: Payer: Self-pay

## 2020-03-13 ENCOUNTER — Encounter: Payer: Medicare HMO | Attending: Physical Medicine & Rehabilitation | Admitting: Physical Medicine & Rehabilitation

## 2020-03-13 VITALS — BP 122/82 | HR 76 | Temp 98.7°F | Wt 175.0 lb

## 2020-03-13 DIAGNOSIS — Z5181 Encounter for therapeutic drug level monitoring: Secondary | ICD-10-CM | POA: Diagnosis not present

## 2020-03-13 DIAGNOSIS — Z79891 Long term (current) use of opiate analgesic: Secondary | ICD-10-CM | POA: Insufficient documentation

## 2020-03-13 DIAGNOSIS — M47816 Spondylosis without myelopathy or radiculopathy, lumbar region: Secondary | ICD-10-CM | POA: Diagnosis not present

## 2020-03-13 DIAGNOSIS — G894 Chronic pain syndrome: Secondary | ICD-10-CM | POA: Diagnosis not present

## 2020-03-13 DIAGNOSIS — M1612 Unilateral primary osteoarthritis, left hip: Secondary | ICD-10-CM | POA: Diagnosis not present

## 2020-03-13 NOTE — Patient Instructions (Signed)
Will do lumbar medial branch blocks in both side

## 2020-03-13 NOTE — Progress Notes (Signed)
Subjective:    Patient ID: [name], female    DOB: 12/26/1965, 55 y.o.   MRN: [id number]  HPI 55 year old female with history of chronic low back pain.  She has had good relief with lumbar medial branch blocks L3-L4 as well as L5 dorsal ramus injection.  In addition she has a history of left hip osteoarthritis which has responded well to intra-articular injections.  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  Pt gets by with an Aleve twice a day   Back pain is hurting the most, Left hip sore  But not as bad as low back, has some sciatic pain Pain Inventory Average Pain 4 Pain Right Now 7 My pain is ?  In the last 24 hours, has pain interfered with the following? General activity 7 Relation with others 2 Enjoyment of life 7 What TIME of day is your pain at its worst? morning , daytime, evening and night Sleep (in general) Fair  Pain is worse with: walking, sitting and standing Pain improves with: none Relief from Meds: not taking pain meds  Family History  Problem Relation Age of Onset  . Hypotension Mother    Social History   Socioeconomic History  . Marital status: Widowed    Spouse name: Not on file  . Number of children: Not on file  . Years of education: Not on file  . Highest education level: Not on file  Occupational History  . Not on file  Tobacco Use  . Smoking status: Current Every Day Smoker    Packs/day: 0.50    Types: Cigarettes  . Smokeless tobacco: Never Used  Substance and Sexual Activity  . Alcohol use: No  . Drug use: No  . Sexual activity: Not on file  Other Topics Concern  . Not on file  Social History Narrative  . Not on file   Social Determinants of Health   Financial Resource Strain: Not on file  Food  Insecurity: Not on file  Transportation Needs: Not on file  Physical Activity: Not on file  Stress: Not on file  Social Connections: Not on file   Past Surgical History:  Procedure Laterality Date  . ABDOMINAL SURGERY    . APPENDECTOMY    . CHOLECYSTECTOMY    . COLONOSCOPY N/A 02/01/2014   2 SIMPLE ADNEOMA(HF), HYEPRPLASTIC RECTAL POLYPS  . DILATION AND CURETTAGE OF UTERUS    . NECK SURGERY    . PARTIAL HYSTERECTOMY     Past Surgical History:  Procedure Laterality Date  . ABDOMINAL SURGERY    . APPENDECTOMY    . CHOLECYSTECTOMY    . COLONOSCOPY N/A 02/01/2014   2 SIMPLE ADNEOMA(HF), HYEPRPLASTIC RECTAL POLYPS  . DILATION AND CURETTAGE OF UTERUS    . NECK SURGERY    . PARTIAL HYSTERECTOMY     Past Medical History:  Diagnosis Date  . Degenerative disk disease   . GERD (gastroesophageal reflux disease)   . Hypertension    BP 122/82   Pulse 76   Temp 98.7 F (37.1 C)   Wt 175 lb (79.4 kg)   SpO2 92%   BMI 27.41 kg/m   Opioid Risk Score:   Fall Risk Score:  `1  Depression screen PHQ 2/9  Depression screen [facility] 2/9 07/12/2019  02/17/2017 11/07/2015  Decreased Interest 0 2 2  Down, Depressed, Hopeless 0 1 1  PHQ - 2 Score 0 3 3  Altered sleeping - - 3  Tired, decreased energy - - 2  Change in appetite - - 2  Feeling bad or failure about yourself  - - 0  Trouble concentrating - - 1  Moving slowly or fidgety/restless - - 2  Suicidal thoughts - - 0  PHQ-9 Score - - 13    Review of Systems  Constitutional: Negative.   HENT: Negative.   Respiratory: Negative.   Cardiovascular: Negative.   Gastrointestinal: Negative.   Endocrine: Negative.   Genitourinary: Negative.   Musculoskeletal: Positive for back pain.  Allergic/Immunologic: Negative.   Neurological: Negative.   Hematological: Negative.   Psychiatric/Behavioral: Negative.   All other systems reviewed and are negative.      Objective:   Physical Exam Vitals and nursing note reviewed.  Constitutional:       Appearance: She is obese.  Eyes:     Extraocular Movements: Extraocular movements intact.     Conjunctiva/sclera: Conjunctivae normal.     Pupils: Pupils are equal, round, and reactive to light.  Musculoskeletal:     Comments: Left groin pain with FABERs  Neg THigh thrust   Skin:    General: Skin is warm.  Neurological:     General: No focal deficit present.     Mental Status: She is alert and oriented to person, place, and time.     Comments: 5/5 in BLE  Sensation nl LT in BLE    Pain along lumbosacral paraspinals Neg SLR BLE Pain with lumbar ext but not with flex        Assessment & Plan:  1.  Acute exacerbation of chronic low back pain , lumbar spondylosis pain, has prolonged effect from lumbar MBB last injection ~1mo ago will repeat in ~3wk  2.  Left hip osteoarthritis - moderately painful but not as bad as #1- pt using OTC Aleve BID

## 2020-04-10 ENCOUNTER — Other Ambulatory Visit: Payer: Self-pay

## 2020-04-10 ENCOUNTER — Encounter: Payer: Self-pay | Admitting: Physical Medicine & Rehabilitation

## 2020-04-10 ENCOUNTER — Encounter: Payer: Medicare HMO | Attending: Physical Medicine & Rehabilitation | Admitting: Physical Medicine & Rehabilitation

## 2020-04-10 VITALS — BP 118/85 | HR 77 | Temp 98.0°F | Ht 67.0 in | Wt 177.2 lb

## 2020-04-10 DIAGNOSIS — M47816 Spondylosis without myelopathy or radiculopathy, lumbar region: Secondary | ICD-10-CM | POA: Diagnosis not present

## 2020-04-10 NOTE — Progress Notes (Signed)
Bilateral Lumbar L3, L4  medial branch blocks and L 5 dorsal ramus injection under fluoroscopic guidance  Indication: Lumbar pain which is not relieved by medication management or other conservative care and interfering with self-care and mobility.  Informed consent was obtained after describing risks and benefits of the procedure with the patient, this includes bleeding, infection, paralysis and medication side effects.  The patient wishes to proceed and has given written consent.  The patient was placed in prone position.  The lumbar area was marked and prepped with Betadine.  One mL of 1% lidocaine was injected into each of 6 areas into the skin and subcutaneous tissue.  Then a 22-gauge 3.5in spinal needle was inserted targeting the junction of the left S1 superior articular process and sacral ala junction. Needle was advanced under fluoroscopic guidance.  Bone contact was made.  Isovue 200 was injected x 0.5 mL demonstrating no intravascular uptake.  Then a solution  of 2% MPF lidocaine was injected x 0.5 mL.  Then the left L5 superior articular process in transverse process junction was targeted.  Bone contact was made.  Isovue 200 was injected x 0.5 mL demonstrating no intravascular uptake. Then a solution containing  2% MPF lidocaine was injected x 0.5 mL.  Then the left L4 superior articular process in transverse process junction was targeted.  Bone contact was made.  Isovue 200 was injected x 0.5 mL demonstrating no intravascular uptake.  Then a solution containing2% MPF lidocaine was injected x 0.5 mL.  This same procedure was performed on the right side using the same needle, technique and injectate.  Patient tolerated procedure well.  Post procedure instructions were given.  

## 2020-04-10 NOTE — Patient Instructions (Addendum)
Lumbar medial branch blocks were performed. This is to help diagnose the cause of the low back pain. It is important that you keep track of your pain for the first day or 2 after injection. This injection can give you temporary relief that lasts for hours or up to several months. There is no way to predict duration of pain relief.  Please try to compare your pain after injection to for the injection.  If this injection gives you  temporary relief there may be another longer-lasting procedure that may be beneficial call radiofrequency ablation  Please let me know if you wish to schedule L hip injection

## 2020-04-10 NOTE — Progress Notes (Signed)
  PROCEDURE RECORD [facility]   Name: PRISILA DLOUHY DOB:[birth year] MRN: [id number]  Date:04/10/2020  Physician: Alysia Penna, MD    Nurse/CMA: Jorja Loa MA  Allergies:  Allergies  Allergen Reactions  . Augmentin [Amoxicillin-Pot Clavulanate] Nausea And Vomiting  . Shellfish Allergy Hives and Swelling  . Ibuprofen Hives and Swelling  . Moxifloxacin Hcl Hives  . Unasyn [Ampicillin-Sulbactam Sodium] Hives and Swelling    Consent Signed: Yes.    Is patient diabetic? No.  CBG today? n/a  Pregnant: No. LMP: No LMP recorded. Patient has had a hysterectomy. (age [age])  Anticoagulants: no Anti-inflammatory: no Antibiotics: no  Procedure: Bilateral Medial Branch Block Position: Prone Start Time: 10:55 AM End Time: 11:08 AM Fluoro Time:1:04  RN/CMA Markise Haymer MA Lance Galas MA    Time 10:38 AM 11:12 AM    BP 118/85 127/86    Pulse 77 80    Respirations 14 14    O2 Sat 95     S/S 6 6    Pain Level 7/10 0/10     D/C home with Antwoine patient A & O X 3, D/C instructions reviewed, and sits independently.

## 2020-05-03 DIAGNOSIS — E7849 Other hyperlipidemia: Secondary | ICD-10-CM | POA: Diagnosis not present

## 2020-05-03 DIAGNOSIS — M1991 Primary osteoarthritis, unspecified site: Secondary | ICD-10-CM | POA: Diagnosis not present

## 2020-05-03 DIAGNOSIS — I10 Essential (primary) hypertension: Secondary | ICD-10-CM | POA: Diagnosis not present

## 2020-05-08 ENCOUNTER — Encounter: Payer: Self-pay | Admitting: Physical Medicine & Rehabilitation

## 2020-05-08 ENCOUNTER — Encounter: Payer: Medicare HMO | Attending: Physical Medicine & Rehabilitation | Admitting: Physical Medicine & Rehabilitation

## 2020-05-08 ENCOUNTER — Other Ambulatory Visit: Payer: Self-pay

## 2020-05-08 VITALS — BP 118/83 | HR 74 | Temp 98.0°F | Ht 67.0 in | Wt 172.0 lb

## 2020-05-08 DIAGNOSIS — M47816 Spondylosis without myelopathy or radiculopathy, lumbar region: Secondary | ICD-10-CM | POA: Diagnosis not present

## 2020-05-08 NOTE — Patient Instructions (Signed)
Will plan radiofrequency to have a longer lasting 6-39mo effect from the spine injection  Would do Left side first an dsee if Right side needs to be done

## 2020-05-08 NOTE — Progress Notes (Signed)
Subjective:    Patient ID: [name], female    DOB: 30-Jun-1965, 55 y.o.   MRN: [id number]  HPI   55 year old female with history of left hip OA as well as lumbar spondylosis without myelopathy she is here for with primary complaints of bilateral low back pain.  This pain increases with standing and improves with sitting.  She has no pain radiating into the lower extremities.  Her hip is not bothering her much these days.  Interval medical history otherwise negative the patient's mother had a stroke and the patient is doing some more caregiving 100% relief with B L3-4-5 MBB performed 04/10/2020- effect now worn off, prior B L3-4-5 MBB  12/15/2018 ~50% relief Author: Charlett Blake, MD Author Type: Physician Filed: 12/15/2018 11:15 AM  Note Status: Signed Cosign: Cosign Not Required Encounter Date: 12/15/2018  Editor: Charlett Blake, MD (Physician)                Bilateral Lumbar L3, L4  medial branch blocks and L 5 dorsal ramus injection under fluoroscopic guidance      Pain Inventory Average Pain 6 Pain Right Now 5 My pain is sharp, tingling and aching  In the last 24 hours, has pain interfered with the following? General activity 4 Relation with others 4 Enjoyment of life 4 What TIME of day is your pain at its worst? morning , daytime, evening and night Sleep (in general) Fair  Pain is worse with: walking, bending and standing Pain improves with: nothing Relief from Meds: 0  Family History  Problem Relation Age of Onset  . Hypotension Mother    Social History   Socioeconomic History  . Marital status: Widowed    Spouse name: Not on file  . Number of children: Not on file  . Years of education: Not on file  . Highest education level: Not on file  Occupational History  . Not on file  Tobacco Use  . Smoking status: Current Every Day Smoker    Packs/day: 0.50    Types: Cigarettes  . Smokeless tobacco: Never Used  Vaping Use  . Vaping Use: Never used   Substance and Sexual Activity  . Alcohol use: No  . Drug use: No  . Sexual activity: Not on file  Other Topics Concern  . Not on file  Social History Narrative  . Not on file   Social Determinants of Health   Financial Resource Strain: Not on file  Food Insecurity: Not on file  Transportation Needs: Not on file  Physical Activity: Not on file  Stress: Not on file  Social Connections: Not on file   Past Surgical History:  Procedure Laterality Date  . ABDOMINAL SURGERY    . APPENDECTOMY    . CHOLECYSTECTOMY    . COLONOSCOPY N/A 02/01/2014   2 SIMPLE ADNEOMA(HF), HYEPRPLASTIC RECTAL POLYPS  . DILATION AND CURETTAGE OF UTERUS    . NECK SURGERY    . PARTIAL HYSTERECTOMY     Past Surgical History:  Procedure Laterality Date  . ABDOMINAL SURGERY    . APPENDECTOMY    . CHOLECYSTECTOMY    . COLONOSCOPY N/A 02/01/2014   2 SIMPLE ADNEOMA(HF), HYEPRPLASTIC RECTAL POLYPS  . DILATION AND CURETTAGE OF UTERUS    . NECK SURGERY    . PARTIAL HYSTERECTOMY     Past Medical History:  Diagnosis Date  . Degenerative disk disease   . GERD (gastroesophageal reflux disease)   . Hypertension    There were  no vitals taken for this visit.  Opioid Risk Score:   Fall Risk Score:  `1  Depression screen PHQ 2/9  Depression screen [facility] 2/9 04/10/2020 07/12/2019 02/17/2017 11/07/2015  Decreased Interest 1 0 2 2  Down, Depressed, Hopeless 1 0 1 1  PHQ - 2 Score 2 0 3 3  Altered sleeping - - - 3  Tired, decreased energy - - - 2  Change in appetite - - - 2  Feeling bad or failure about yourself  - - - 0  Trouble concentrating - - - 1  Moving slowly or fidgety/restless - - - 2  Suicidal thoughts - - - 0  PHQ-9 Score - - - 13    Review of Systems  Constitutional: Negative.   HENT: Negative.   Eyes: Negative.   Respiratory: Negative.   Cardiovascular: Negative.   Gastrointestinal: Negative.   Endocrine: Negative.   Genitourinary: Negative.   Musculoskeletal: Positive for back pain.   Skin: Negative.   Allergic/Immunologic: Negative.   Neurological: Negative.   Hematological: Negative.   Psychiatric/Behavioral: Negative.   All other systems reviewed and are negative.      Objective:   Physical Exam Vitals and nursing note reviewed.  Constitutional:      Appearance: She is normal weight.  Eyes:     Extraocular Movements: Extraocular movements intact.     Conjunctiva/sclera: Conjunctivae normal.     Pupils: Pupils are equal, round, and reactive to light.  Musculoskeletal:        General: No swelling or tenderness.     Right lower leg: No edema.     Left lower leg: No edema.     Comments: Tenderness palpation bilateral lumbosacral paraspinal area.  Pain with extension greater than with flexion lumbar extension is limited to 75% flexion is good 100%  Skin:    General: Skin is warm and dry.  Neurological:     Mental Status: She is alert and oriented to person, place, and time.  Psychiatric:        Mood and Affect: Mood normal.        Behavior: Behavior normal.     Reduced pp L4 on RIght  DTR 2+ patellar , 0 at achilles      Assessment & Plan:  1.  Lumbar spondylosis mild myelopathy.  We discussed that in the past she has had very prolonged relief with medial branch blocks at L3-4-5 bilaterally her last response is more typical where she had 100 100% relief but of shorter duration.  Would recommend bilateral L3-4-5 radiofrequency neurotomy.  As discussed with patient we do  side first followed by the right  side the following month.

## 2020-06-03 DIAGNOSIS — M1991 Primary osteoarthritis, unspecified site: Secondary | ICD-10-CM | POA: Diagnosis not present

## 2020-06-03 DIAGNOSIS — I10 Essential (primary) hypertension: Secondary | ICD-10-CM | POA: Diagnosis not present

## 2020-06-03 DIAGNOSIS — E7849 Other hyperlipidemia: Secondary | ICD-10-CM | POA: Diagnosis not present

## 2020-06-13 ENCOUNTER — Other Ambulatory Visit: Payer: Self-pay

## 2020-06-13 ENCOUNTER — Encounter: Payer: Medicare HMO | Attending: Physical Medicine & Rehabilitation | Admitting: Physical Medicine & Rehabilitation

## 2020-06-13 ENCOUNTER — Encounter: Payer: Self-pay | Admitting: Physical Medicine & Rehabilitation

## 2020-06-13 VITALS — BP 123/84 | HR 68 | Temp 98.0°F | Ht 67.0 in | Wt 169.0 lb

## 2020-06-13 DIAGNOSIS — M47816 Spondylosis without myelopathy or radiculopathy, lumbar region: Secondary | ICD-10-CM | POA: Insufficient documentation

## 2020-06-13 NOTE — Patient Instructions (Addendum)

## 2020-06-13 NOTE — Progress Notes (Signed)
Bilateral Lumbar L3, L4  medial branch blocks and L 5 dorsal ramus injection under fluoroscopic guidance  Indication: Lumbar pain which is not relieved by medication management or other conservative care and interfering with self-care and mobility.  Informed consent was obtained after describing risks and benefits of the procedure with the patient, this includes bleeding, infection, paralysis and medication side effects.  The patient wishes to proceed and has given written consent.  The patient was placed in prone position.  The lumbar area was marked and prepped with Betadine.  One mL of 1% lidocaine was injected into each of 6 areas into the skin and subcutaneous tissue.  Then a 22-gauge 10cm spinal needle was inserted targeting the junction of the left S1 superior articular process and sacral ala junction. Needle was advanced under fluoroscopic guidance.  Bone contact was made.  Isovue 200 was injected x 0.5 mL demonstrating no intravascular uptake.  Then a solution  of 2% MPF lidocaine was injected x 0.5 mL.  Then the left L5 superior articular process in transverse process junction was targeted.  Bone contact was made.  Isovue 200 was injected x 0.5 mL demonstrating no intravascular uptake. Then a solution containing  2% MPF lidocaine was injected x 0.5 mL.  Then the left L4 superior articular process in transverse process junction was targeted.  Bone contact was made.  Isovue 200 was injected x 0.5 mL demonstrating no intravascular uptake.  Then a solution containing2% MPF lidocaine was injected x 0.5 mL.  This same procedure was performed on the right side using the same needle, technique and injectate.  Patient tolerated procedure well.  Post procedure instructions were given.

## 2020-06-13 NOTE — Progress Notes (Signed)
  PROCEDURE RECORD [facility]   Name: MARGRETT KALB DOB:1965/11/28 MRN: [id number]  Date:06/13/2020  Physician: Alysia Penna, MD    Nurse/CMA: Mechel Schutter, CMA  Allergies:  Allergies  Allergen Reactions   Amoxicillin-Pot Clavulanate Nausea And Vomiting and Hives   Ampicillin-Sulbactam Sodium Hives and Swelling   Ibuprofen Hives and Swelling   Moxifloxacin Nausea Only   Shellfish Allergy Hives and Swelling   Shellfish-Derived Products Hives and Itching   Moxifloxacin Hcl Hives    Consent Signed: Yes.    Is patient diabetic? No.  CBG today?   Pregnant: No. LMP: No LMP recorded. Patient has had a hysterectomy. (age [age])  Anticoagulants: no Anti-inflammatory: no Antibiotics: no  Procedure: bilateral lumbar 3,4,5 medial branch block  Position: Prone Start Time: 10:50am  End Time: 11:02am  Fluoro Time: 53s  RN/CMA Xian Apostol, CMA Tryson Lumley, CMA    Time 10:30am 11:08am    BP 123/84 124/85    Pulse 68 65    Respirations 16 6    O2 Sat 92 94    S/S 6 6    Pain Level 8/10 1/10     D/C home with sister, patient A & O X 3, D/C instructions reviewed, and sits independently.

## 2020-07-22 ENCOUNTER — Encounter: Payer: Medicare HMO | Attending: Physical Medicine & Rehabilitation | Admitting: Physical Medicine & Rehabilitation

## 2020-07-22 ENCOUNTER — Encounter: Payer: Self-pay | Admitting: Physical Medicine & Rehabilitation

## 2020-07-22 ENCOUNTER — Other Ambulatory Visit: Payer: Self-pay

## 2020-07-22 VITALS — BP 137/82 | HR 66 | Temp 97.9°F | Ht 67.0 in | Wt 172.0 lb

## 2020-07-22 DIAGNOSIS — M5442 Lumbago with sciatica, left side: Secondary | ICD-10-CM | POA: Insufficient documentation

## 2020-07-22 MED ORDER — GABAPENTIN 100 MG PO CAPS: 100.0000 mg | ORAL_CAPSULE | Freq: Two times a day (BID) | ORAL | 1 refills | Status: DC

## 2020-07-22 NOTE — Progress Notes (Signed)
Subjective:    Patient ID: [name], female    DOB: 1965/12/19, 55 y.o.   MRN: [id number]  HPI 55 year old female with history of lumbar spondylosis, left hip osteoarthritis who is here today with chief complaint low back pain radiating into the left lower extremity.  Pain is mostly posterior in the buttocks region as well as the posterior thigh and leg area.  Pain is worse with walking and bending as well as standing.  She has some mild sleep disturbance with this.  She states that she is independent with all her self-care and mobility however did not plan to garden this year because of discomfort.  The has had some feeling that her left lower extremity might give way but has not fallen Pain Inventory Average Pain 6 Pain Right Now 6 My pain is burning, tingling, and aching  In the last 24 hours, has pain interfered with the following? General activity 6 Relation with others 4 Enjoyment of life 6 What TIME of day is your pain at its worst? morning , daytime, evening, and night Sleep (in general) Fair  Pain is worse with: walking and bending Pain improves with:  not sure Relief from Meds:  not sure  Family History  Problem Relation Age of Onset   Hypotension Mother    Social History   Socioeconomic History   Marital status: Widowed    Spouse name: Not on file   Number of children: Not on file   Years of education: Not on file   Highest education level: Not on file  Occupational History   Not on file  Tobacco Use   Smoking status: Every Day    Packs/day: 0.50    Types: Cigarettes   Smokeless tobacco: Never  Vaping Use   Vaping Use: Never used  Substance and Sexual Activity   Alcohol use: No   Drug use: No   Sexual activity: Not on file  Other Topics Concern   Not on file  Social History Narrative   Not on file   Social Determinants of Health   Financial Resource Strain: Not on file  Food Insecurity: Not on file  Transportation Needs: Not on file   Physical Activity: Not on file  Stress: Not on file  Social Connections: Not on file   Past Surgical History:  Procedure Laterality Date   ABDOMINAL SURGERY     APPENDECTOMY     CHOLECYSTECTOMY     COLONOSCOPY N/A 02/01/2014   2 SIMPLE ADNEOMA(HF), HYEPRPLASTIC RECTAL POLYPS   DILATION AND CURETTAGE OF UTERUS     NECK SURGERY     PARTIAL HYSTERECTOMY     Past Surgical History:  Procedure Laterality Date   ABDOMINAL SURGERY     APPENDECTOMY     CHOLECYSTECTOMY     COLONOSCOPY N/A 02/01/2014   2 SIMPLE ADNEOMA(HF), HYEPRPLASTIC RECTAL POLYPS   DILATION AND CURETTAGE OF UTERUS     NECK SURGERY     PARTIAL HYSTERECTOMY     Past Medical History:  Diagnosis Date   Degenerative disk disease    GERD (gastroesophageal reflux disease)    Hypertension    BP 137/82 (BP Location: Right Arm)   Pulse 66   Temp 97.9 F (36.6 C) (Oral)   Ht 5\' 7"  (1.702 m)   Wt 172 lb (78 kg)   SpO2 93%   BMI 26.94 kg/m   Opioid Risk Score:   Fall Risk Score:  `1  Depression screen PHQ 2/9  Depression screen  [facility] 2/9 04/10/2020 07/12/2019 02/17/2017 11/07/2015  Decreased Interest 1 0 2 2  Down, Depressed, Hopeless 1 0 1 1  PHQ - 2 Score 2 0 3 3  Altered sleeping - - - 3  Tired, decreased energy - - - 2  Change in appetite - - - 2  Feeling bad or failure about yourself  - - - 0  Trouble concentrating - - - 1  Moving slowly or fidgety/restless - - - 2  Suicidal thoughts - - - 0  PHQ-9 Score - - - 13     Review of Systems  Constitutional: Negative.   HENT: Negative.    Eyes: Negative.   Respiratory: Negative.    Cardiovascular: Negative.   Gastrointestinal: Negative.   Endocrine: Negative.   Genitourinary: Negative.   Musculoskeletal:  Positive for back pain and gait problem.       Posterior left leg pain ,  Skin: Negative.   Allergic/Immunologic: Negative.   Hematological: Negative.   Psychiatric/Behavioral: Negative.        Objective:   Physical Exam Vitals and nursing note  reviewed.  Constitutional:      Appearance: She is normal weight.  HENT:     Head: Normocephalic and atraumatic.  Eyes:     Extraocular Movements: Extraocular movements intact.     Conjunctiva/sclera: Conjunctivae normal.     Pupils: Pupils are equal, round, and reactive to light.  Musculoskeletal:        General: Normal range of motion.  Skin:    General: Skin is warm and dry.  Neurological:     General: No focal deficit present.     Mental Status: She is alert and oriented to person, place, and time.     Cranial Nerves: No cranial nerve deficit or dysarthria.     Deep Tendon Reflexes:     Reflex Scores:      Patellar reflexes are 2+ on the right side and 2+ on the left side.      Achilles reflexes are 2+ on the right side and 2+ on the left side.    Comments: Motor strength is 5/5 bilateral hip flexor knee extensor ankle dorsiflexor Sensation intact light touch bilateral lower limbs Negative straight leg raising bilaterally   Psychiatric:        Mood and Affect: Mood normal.        Behavior: Behavior normal.          Assessment & Plan:  #1.  Acute exacerbation of chronic low back pain mainly pain on the left side.  She has a history of left hip OA however this does not appear to be the pain generator.  She does have sciatic type symptoms and review of last MRI demonstrates that she does have some foraminal stenosis L5 S1 which may have progressed since her last MRI in July 2017.  At that time she had more stenosis on the right side than on the left Given activity limitations and duration of her discomfort of greater than 6 weeks we will order MRI of the lumbar spine.  I have given her some exercises to do as well.  We will review the MRI results and decide whether we will pursue epidural steroid injections as well.

## 2020-07-22 NOTE — Patient Instructions (Signed)
Back Exercises These exercises help to make your trunk and back strong. They also help to keep the lower back flexible. Doing these exercises can help to prevent back pain or lessen existing pain. If you have back pain, try to do these exercises 2-3 times each day or as told by your doctor. As you get better, do the exercises once each day. Repeat the exercises more often as told by your doctor. To stop back pain from coming back, do the exercises once each day, or as told by your doctor. Exercises Single knee to chest Do these steps 3-5 times in a row for each leg: Lie on your back on a firm bed or the floor with your legs stretched out. Bring one knee to your chest. Grab your knee or thigh with both hands and hold them it in place. Pull on your knee until you feel a gentle stretch in your lower back or buttocks. Keep doing the stretch for 10-30 seconds. Slowly let go of your leg and straighten it. Pelvic tilt Do these steps 5-10 times in a row: Lie on your back on a firm bed or the floor with your legs stretched out. Bend your knees so they point up to the ceiling. Your feet should be flat on the floor. Tighten your lower belly (abdomen) muscles to press your lower back against the floor. This will make your tailbone point up to the ceiling instead of pointing down to your feet or the floor. Stay in this position for 5-10 seconds while you gently tighten your muscles and breathe evenly. Cat-cow Do these steps until your lower back bends more easily: Get on your hands and knees on a firm surface. Keep your hands under your shoulders, and keep your knees under your hips. You may put padding under your knees. Let your head hang down toward your chest. Tighten (contract) the muscles in your belly. Point your tailbone toward the floor so your lower back becomes rounded like the back of a cat. Stay in this position for 5 seconds. Slowly lift your head. Let the muscles of your belly relax. Point  your tailbone up toward the ceiling so your back forms a sagging arch like the back of a cow. Stay in this position for 5 seconds.  Press-ups Do these steps 5-10 times in a row: Lie on your belly (face-down) on the floor. Place your hands near your head, about shoulder-width apart. While you keep your back relaxed and keep your hips on the floor, slowly straighten your arms to raise the top half of your body and lift your shoulders. Do not use your back muscles. You may change where you place your hands in order to make yourself more comfortable. Stay in this position for 5 seconds. Slowly return to lying flat on the floor.  Bridges Do these steps 10 times in a row: Lie on your back on a firm surface. Bend your knees so they point up to the ceiling. Your feet should be flat on the floor. Your arms should be flat at your sides, next to your body. Tighten your butt muscles and lift your butt off the floor until your waist is almost as high as your knees. If you do not feel the muscles working in your butt and the back of your thighs, slide your feet 1-2 inches farther away from your butt. Stay in this position for 3-5 seconds. Slowly lower your butt to the floor, and let your butt muscles relax. If  this exercise is too easy, try doing it with your arms crossed over yourchest. Belly crunches Do these steps 5-10 times in a row: Lie on your back on a firm bed or the floor with your legs stretched out. Bend your knees so they point up to the ceiling. Your feet should be flat on the floor. Cross your arms over your chest. Tip your chin a little bit toward your chest but do not bend your neck. Tighten your belly muscles and slowly raise your chest just enough to lift your shoulder blades a tiny bit off of the floor. Avoid raising your body higher than that, because it can put too much stress on your low back. Slowly lower your chest and your head to the floor. Back lifts Do these steps 5-10 times  in a row: Lie on your belly (face-down) with your arms at your sides, and rest your forehead on the floor. Tighten the muscles in your legs and your butt. Slowly lift your chest off of the floor while you keep your hips on the floor. Keep the back of your head in line with the curve in your back. Look at the floor while you do this. Stay in this position for 3-5 seconds. Slowly lower your chest and your face to the floor. Contact a doctor if: Your back pain gets a lot worse when you do an exercise. Your back pain does not get better 2 hours after you exercise. If you have any of these problems, stop doing the exercises. Do not do them again unless your doctor says it is okay. Get help right away if: You have sudden, very bad back pain. If this happens, stop doing the exercises. Do not do them again unless your doctor says it is okay. This information is not intended to replace advice given to you by your health care provider. Make sure you discuss any questions you have with your healthcare provider. Document Revised: 09/15/2017 Document Reviewed: 09/15/2017 Elsevier Patient Education  2022 [employer].

## 2020-08-08 ENCOUNTER — Ambulatory Visit (HOSPITAL_COMMUNITY)
Admission: RE | Admit: 2020-08-08 | Discharge: 2020-08-08 | Disposition: A | Discharge status: Home / Self Care | Payer: Medicare HMO | Source: Ambulatory Visit | Attending: Physical Medicine & Rehabilitation | Admitting: Physical Medicine & Rehabilitation

## 2020-08-08 ENCOUNTER — Other Ambulatory Visit: Payer: Self-pay

## 2020-08-08 DIAGNOSIS — M545 Low back pain, unspecified: Secondary | ICD-10-CM | POA: Diagnosis not present

## 2020-08-08 DIAGNOSIS — M5442 Lumbago with sciatica, left side: Secondary | ICD-10-CM | POA: Diagnosis not present

## 2020-08-18 ENCOUNTER — Telehealth: Payer: Self-pay | Admitting: *Deleted

## 2020-08-18 NOTE — Telephone Encounter (Addendum)
-----   Message from Charlett Blake, MD sent at 08/18/2020  7:03 AM EDT ----- Please notify [name] that MRI looks the same as 2017 which is very good.  No need for surgery referral Will discuss details next visit.  Mrs Muzzey notified.

## 2020-09-19 ENCOUNTER — Encounter: Payer: Medicare HMO | Attending: Physical Medicine & Rehabilitation | Admitting: Physical Medicine & Rehabilitation

## 2020-09-19 ENCOUNTER — Encounter: Payer: Self-pay | Admitting: Physical Medicine & Rehabilitation

## 2020-09-19 ENCOUNTER — Other Ambulatory Visit: Payer: Self-pay

## 2020-09-19 VITALS — BP 116/54 | HR 71 | Temp 98.1°F | Ht 67.0 in | Wt 173.0 lb

## 2020-09-19 DIAGNOSIS — M7062 Trochanteric bursitis, left hip: Secondary | ICD-10-CM

## 2020-09-19 DIAGNOSIS — G8929 Other chronic pain: Secondary | ICD-10-CM

## 2020-09-19 DIAGNOSIS — M47816 Spondylosis without myelopathy or radiculopathy, lumbar region: Secondary | ICD-10-CM | POA: Diagnosis not present

## 2020-09-19 DIAGNOSIS — M25552 Pain in left hip: Secondary | ICD-10-CM | POA: Diagnosis not present

## 2020-09-19 NOTE — Progress Notes (Signed)
Subjective:    Patient ID: [name], female    DOB: June 08, 1965, 55 y.o.   MRN: [id number]  HPI 55 yo female with hx of Bilateral low back as well as left hip pain The pt has had pain along the lateral hip that Radiates to her left leg and foot.  No numbness, tingling or weakness, because of the sciatic symptoms with worsening of functional status a lumbar MRI was ordered,  Here to discuss MRI results   CLINICAL DATA:  Chronic low back pain radiating to the left leg   EXAM: MRI LUMBAR SPINE WITHOUT CONTRAST   TECHNIQUE: Multiplanar, multisequence MR imaging of the lumbar spine was performed. No intravenous contrast was administered.   COMPARISON:  08/01/2015   FINDINGS: Segmentation:  5 lumbar type vertebrae   Alignment:  Normal   Vertebrae:  No fracture, evidence of discitis, or bone lesion.   Conus medullaris and cauda equina: Conus extends to the L1 level. Conus and cauda equina appear normal.   Paraspinal and other soft tissues: T2 hyperintensity at the left renal hilum has cystic features with intensity measured similar to CSF.   Disc levels:   T12- L1: Unremarkable.   L1-L2: Unremarkable.   L2-L3: Mild disc bulging and facet spurring   L3-L4: Mild disc bulging and facet spurring   L4-L5: Mild facet spurring and ligamentum flavum thickening. Circumferential disc bulging. No neural compression   L5-S1:Greatest level of disc narrowing and bulging with endplate spurring. Mild facet spurring. No neural compression.   IMPRESSION: Lower lumbar disc and facet degeneration without neural compression or noted progression from 2017.     Electronically Signed   By: Monte Fantasia M.D.   On: 08/09/2020 07:54   Pain Inventory Average Pain 6 Pain Right Now 4 My pain is burning, tingling, and aching  In the last 24 hours, has pain interfered with the following? General activity 3 Relation with others 3 Enjoyment of life 3 What TIME of day is your  pain at its worst? morning , daytime, evening, and night Sleep (in general) Fair  Pain is worse with: bending, sitting, and standing Pain improves with:  nothing Relief from Meds:  na  Family History  Problem Relation Age of Onset   Hypotension Mother    Social History   Socioeconomic History   Marital status: Widowed    Spouse name: Not on file   Number of children: Not on file   Years of education: Not on file   Highest education level: Not on file  Occupational History   Not on file  Tobacco Use   Smoking status: Every Day    Packs/day: 0.50    Types: Cigarettes   Smokeless tobacco: Never  Vaping Use   Vaping Use: Never used  Substance and Sexual Activity   Alcohol use: No   Drug use: No   Sexual activity: Not on file  Other Topics Concern   Not on file  Social History Narrative   Not on file   Social Determinants of Health   Financial Resource Strain: Not on file  Food Insecurity: Not on file  Transportation Needs: Not on file  Physical Activity: Not on file  Stress: Not on file  Social Connections: Not on file   Past Surgical History:  Procedure Laterality Date   ABDOMINAL SURGERY     APPENDECTOMY     CHOLECYSTECTOMY     COLONOSCOPY N/A 02/01/2014   2 SIMPLE ADNEOMA(HF), HYEPRPLASTIC RECTAL POLYPS  DILATION AND CURETTAGE OF UTERUS     NECK SURGERY     PARTIAL HYSTERECTOMY     Past Surgical History:  Procedure Laterality Date   ABDOMINAL SURGERY     APPENDECTOMY     CHOLECYSTECTOMY     COLONOSCOPY N/A 02/01/2014   2 SIMPLE ADNEOMA(HF), HYEPRPLASTIC RECTAL POLYPS   DILATION AND CURETTAGE OF UTERUS     NECK SURGERY     PARTIAL HYSTERECTOMY     Past Medical History:  Diagnosis Date   Degenerative disk disease    GERD (gastroesophageal reflux disease)    Hypertension    BP (!) 116/54   Pulse 71   Temp 98.1 F (36.7 C) (Oral)   Ht '5\' 7"'$  (1.702 m)   Wt 173 lb (78.5 kg)   SpO2 93%   BMI 27.10 kg/m   Opioid Risk Score:   Fall Risk Score:   `1  Depression screen PHQ 2/9  Depression screen [facility] 2/9 09/19/2020 07/22/2020 04/10/2020 07/12/2019 02/17/2017 11/07/2015  Decreased Interest 0 1 1 0 2 2  Down, Depressed, Hopeless 0 1 1 0 1 1  PHQ - 2 Score 0 2 2 0 3 3  Altered sleeping - - - - - 3  Tired, decreased energy - - - - - 2  Change in appetite - - - - - 2  Feeling bad or failure about yourself  - - - - - 0  Trouble concentrating - - - - - 1  Moving slowly or fidgety/restless - - - - - 2  Suicidal thoughts - - - - - 0  PHQ-9 Score - - - - - 13     Review of Systems  Musculoskeletal:  Positive for back pain.       Left shoulder, hip, leg pain   All other systems reviewed and are negative.     Objective:   Physical Exam Vitals and nursing note reviewed.  Constitutional:      Appearance: She is normal weight.  HENT:     Head: Normocephalic and atraumatic.  Eyes:     Extraocular Movements: Extraocular movements intact.     Conjunctiva/sclera: Conjunctivae normal.     Pupils: Pupils are equal, round, and reactive to light.  Musculoskeletal:        General: Tenderness present.     Right lower leg: No edema.     Left lower leg: No edema.     Comments: Left troch  tenderness, no tenderness over the right greater troch  Neg SLR   Skin:    General: Skin is warm and dry.  Neurological:     Mental Status: She is alert and oriented to person, place, and time.  Psychiatric:        Mood and Affect: Mood normal.        Behavior: Behavior normal.   Sensation nl BLE 5/5 HF, KE, ADF, bilateral        Assessment & Plan:    Left side hip and back pain , repeat lumbar MRI show no neural compression to explain her symptoms Exam today most consistent with L hip troch bursitis will inject today given degree of discomfort and refer to OP PT  Trochanteric bursa injection LEFT  without ultrasound guidance  Indication Trochanteric bursitis. Exam has tenderness over the LEFT greater trochanter of the hip. Pain has not  responded to conservative care such as exercise therapy and oral medications. Pain interferes with sleep or with mobility Informed consent was obtained after  describing risks and benefits of the procedure with the patient these include bleeding bruising and infection. Patient has signed written consent form. Patient placed in a lateral decubitus position with the affected hip superior. Point of maximal pain was palpated marked and prepped with Betadine and entered with a needle to bone contact. Needle slightly withdrawn then '6mg'$  of betamethasone with 4 cc 1% lidocaine were injected. Patient tolerated procedure well. Post procedure instructions given.

## 2020-09-26 DIAGNOSIS — J0141 Acute recurrent pansinusitis: Secondary | ICD-10-CM | POA: Diagnosis not present

## 2020-09-26 DIAGNOSIS — I10 Essential (primary) hypertension: Secondary | ICD-10-CM | POA: Diagnosis not present

## 2020-09-26 DIAGNOSIS — E663 Overweight: Secondary | ICD-10-CM | POA: Diagnosis not present

## 2020-09-26 DIAGNOSIS — Z13220 Encounter for screening for lipoid disorders: Secondary | ICD-10-CM | POA: Diagnosis not present

## 2020-09-26 DIAGNOSIS — R7309 Other abnormal glucose: Secondary | ICD-10-CM | POA: Diagnosis not present

## 2020-09-26 DIAGNOSIS — H8110 Benign paroxysmal vertigo, unspecified ear: Secondary | ICD-10-CM | POA: Diagnosis not present

## 2020-09-26 DIAGNOSIS — M1991 Primary osteoarthritis, unspecified site: Secondary | ICD-10-CM | POA: Diagnosis not present

## 2020-09-26 DIAGNOSIS — J45909 Unspecified asthma, uncomplicated: Secondary | ICD-10-CM | POA: Diagnosis not present

## 2020-09-29 ENCOUNTER — Other Ambulatory Visit (HOSPITAL_COMMUNITY): Payer: Self-pay | Admitting: Internal Medicine

## 2020-09-29 DIAGNOSIS — R0600 Dyspnea, unspecified: Secondary | ICD-10-CM

## 2020-10-02 ENCOUNTER — Other Ambulatory Visit: Payer: Self-pay

## 2020-10-02 ENCOUNTER — Ambulatory Visit (HOSPITAL_COMMUNITY)
Admission: RE | Admit: 2020-10-02 | Discharge: 2020-10-02 | Disposition: A | Discharge status: Home / Self Care | Payer: Medicare HMO | Source: Ambulatory Visit | Attending: Internal Medicine | Admitting: Internal Medicine

## 2020-10-02 DIAGNOSIS — R0600 Dyspnea, unspecified: Secondary | ICD-10-CM | POA: Insufficient documentation

## 2020-10-07 ENCOUNTER — Other Ambulatory Visit (HOSPITAL_COMMUNITY): Payer: Self-pay | Admitting: Family Medicine

## 2020-10-07 DIAGNOSIS — Z1231 Encounter for screening mammogram for malignant neoplasm of breast: Secondary | ICD-10-CM

## 2020-10-08 DIAGNOSIS — M5136 Other intervertebral disc degeneration, lumbar region: Secondary | ICD-10-CM | POA: Diagnosis not present

## 2020-10-08 DIAGNOSIS — M1991 Primary osteoarthritis, unspecified site: Secondary | ICD-10-CM | POA: Diagnosis not present

## 2020-10-08 DIAGNOSIS — M25552 Pain in left hip: Secondary | ICD-10-CM | POA: Diagnosis not present

## 2020-10-09 ENCOUNTER — Other Ambulatory Visit: Payer: Self-pay

## 2020-10-09 ENCOUNTER — Ambulatory Visit (HOSPITAL_COMMUNITY)
Admission: RE | Admit: 2020-10-09 | Discharge: 2020-10-09 | Disposition: A | Discharge status: Home / Self Care | Payer: Medicare HMO | Source: Ambulatory Visit | Attending: Family Medicine | Admitting: Family Medicine

## 2020-10-09 ENCOUNTER — Ambulatory Visit (HOSPITAL_COMMUNITY): Payer: Medicare HMO | Attending: Physical Medicine & Rehabilitation | Admitting: Physical Therapy

## 2020-10-09 ENCOUNTER — Encounter (HOSPITAL_COMMUNITY): Payer: Self-pay | Admitting: Physical Therapy

## 2020-10-09 DIAGNOSIS — M25552 Pain in left hip: Secondary | ICD-10-CM | POA: Diagnosis not present

## 2020-10-09 DIAGNOSIS — Z1231 Encounter for screening mammogram for malignant neoplasm of breast: Secondary | ICD-10-CM | POA: Diagnosis not present

## 2020-10-09 DIAGNOSIS — M545 Low back pain, unspecified: Secondary | ICD-10-CM | POA: Insufficient documentation
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
## 2020-10-09 NOTE — Patient Instructions (Signed)
Access Code: [email or web address] URL: [email or web address] Date: 10/09/2020 Prepared by: Josue Hector  Exercises Supine Bridge - 2 x daily - 7 x weekly - 2 sets - 10 reps Sidelying Hip Abduction - 2 x daily - 7 x weekly - 2 sets - 10 reps Seated Lumbar Flexion Stretch - 2 x daily - 7 x weekly - 1 sets - 3 reps - 20-30 seconds hold

## 2020-10-14 ENCOUNTER — Ambulatory Visit (HOSPITAL_COMMUNITY): Payer: Medicare HMO | Admitting: Physical Therapy

## 2020-10-14 ENCOUNTER — Other Ambulatory Visit: Payer: Self-pay

## 2020-10-14 DIAGNOSIS — M25552 Pain in left hip: Secondary | ICD-10-CM | POA: Diagnosis not present

## 2020-10-14 DIAGNOSIS — M545 Low back pain, unspecified: Secondary | ICD-10-CM

## 2020-10-14 NOTE — Therapy (Signed)
[place], [place], [address] Phone: [phone]   Fax:  [phone]  Physical Therapy Treatment  Patient Details  Name: [name] MRN: [id number] Date of Birth: Aug 10, 1965 Referring Provider (PT): Lupe Carney MD   Encounter Date: 10/14/2020   PT End of Session - 10/14/20 0959     Visit Number 2    Number of Visits 12    Date for PT Re-Evaluation 11/20/20    Authorization Type [employer]    Authorization Time Period 12 visits approved 10/6-11/17    Authorization - Visit Number 2    Authorization - Number of Visits 12    Progress Note Due on Visit 10    PT Start Time 0916    PT Stop Time 0958    PT Time Calculation (min) 42 min    Activity Tolerance Patient tolerated treatment well    Behavior During Therapy [facility] for tasks assessed/performed             Past Medical History:  Diagnosis Date   Degenerative disk disease    GERD (gastroesophageal reflux disease)    Hypertension     Past Surgical History:  Procedure Laterality Date   ABDOMINAL SURGERY     APPENDECTOMY     CHOLECYSTECTOMY     COLONOSCOPY N/A 02/01/2014   2 SIMPLE ADNEOMA(HF), HYEPRPLASTIC RECTAL POLYPS   DILATION AND CURETTAGE OF UTERUS     NECK SURGERY     PARTIAL HYSTERECTOMY      There were no vitals filed for this visit.   Subjective Assessment - 10/14/20 0922     Subjective pt states she has not been that compliant with her HEP due to her nephew being at [facility].  STates she currently has no pain.    Currently in Pain? No/denies                [facility] PT Assessment - 10/14/20 0001       Observation/Other Assessments   Focus on Therapeutic Outcomes (FOTO)  53.4% functional status                           [facility] Adult PT Treatment/Exercise - 10/14/20 0001       Lumbar Exercises: Stretches   Single Knee to Chest Stretch Left;Right;20 seconds;3 reps    Lower Trunk Rotation 10  seconds    Lower Trunk Rotation Limitations 10 reps each way      Lumbar Exercises: Supine   Ab Set 10 reps;5 seconds    Bent Knee Raise 10 reps    Bridge 15 reps    Straight Leg Raise 15 reps      Lumbar Exercises: Sidelying   Hip Abduction Both;20 reps                       PT Short Term Goals - 10/14/20 8242       PT SHORT TERM GOAL #1   Title Patient will be independent with initial HEP and self-management strategies to improve functional outcomes    Time 3    Period Weeks    Status On-going    Target Date 10/30/20               PT Long Term Goals - 10/14/20 0953       PT LONG TERM GOAL #1   Title Patient will improve FOTO score to predicted value  to indicate improvement in functional outcomes    Time 6    Period Weeks    Status On-going      PT LONG TERM GOAL #2   Title Patient will report at least 75% overall improvement in subjective complaint to indicate improvement in ability to perform ADLs.    Time 6    Period Weeks    Status On-going      PT LONG TERM GOAL #3   Title Patient will be able to stand > 30 minutes with pain not to exceed 3/10 in lumbar to improve ability to perform cooking and grooming ADLs.    Time 6    Period Weeks    Status Partially Met      PT LONG TERM GOAL #4   Title Patient will have equal to or > 4/5 MMT throughout BLE to improve functional mobility, stair ambulation, ADLs and ability to care for family members.    Time 6    Period Weeks    Status On-going                   Plan - 10/14/20 3299     Clinical Impression Statement Reviewed goals, HEP and POC moving forward. Initiated abdominal isometrics/stabilization exercises in supine as well as lumbar mobility and LE stretching.  Pt able to recall HEP and complete with minimal cues.  Pt without complaints of pain with any activity today.  FOTO completed at 53.4% functional status and recorded in chart.  Pt encouraged to increase compliance with HEP.     Examination-Activity Limitations Lift;Stand;Locomotion Level;Transfers;Bend;Sit;Stairs;Squat    Examination-Participation Restrictions Cleaning;Yard Work;Laundry;Shop;Community Activity    Stability/Clinical Decision Making Stable/Uncomplicated    Rehab Potential Good    PT Frequency 2x / week    PT Duration 6 weeks    PT Treatment/Interventions ADLs/Self Care Home Management;Biofeedback;Cryotherapy;Ultrasound;Parrafin;Fluidtherapy;Therapeutic activities;Functional mobility training;Neuromuscular re-education;Passive range of motion;Manual techniques;Dry needling;Energy conservation;Manual lymph drainage;Patient/family education;Splinting;Spinal Manipulations;Visual/perceptual remediation/compensation;Vestibular;Vasopneumatic Device;Joint Manipulations;Taping;Compression bandaging;Orthotic Fit/Training;Scar mobilization;Balance training;Therapeutic exercise;Contrast Bath;DME Instruction;Electrical Stimulation;Iontophoresis 29m/ml Dexamethasone;Gait training;Stair training;Moist Heat;Traction    PT Next Visit Plan Continue to progress abdominal stabilization.  Follow up on HEP compliance next visit.    PT Home Exercise Plan Eval: Bridge, sidelying hip abduction, seated lumbar flexion    Consulted and Agree with Plan of Care Patient             Patient will benefit from skilled therapeutic intervention in order to improve the following deficits and impairments:  Decreased balance, Impaired flexibility, Decreased activity tolerance, Decreased range of motion, Decreased strength, Hypomobility, Decreased mobility, Pain, Increased fascial restricitons  Visit Diagnosis: Low back pain, unspecified back pain laterality, unspecified chronicity, unspecified whether sciatica present  Pain in left hip     Problem List Patient Active Problem List   Diagnosis Date Noted   Primary osteoarthritis of left hip 11/18/2015   Chronic left hip pain 11/07/2015   Spondylosis without myelopathy or  radiculopathy, lumbar region 11/07/2015   Special screening for malignant neoplasms, colon    Left leg weakness 10/02/2012   Unstable balance 10/02/2012   IMPINGEMENT SYNDROME 07/28/2009   SHOULDER STRAIN, LEFT 07/28/2009   ATeena Irani PTA/CLT, WTA [phone] FTeena Irani PTA 10/14/2020, 10:01 AM  [id number] [address]Phone: [phone]  Fax:  [phone] Name: Rachel WIGINTONMRN: [id number] of Birth: [birth year]

## 2020-10-16 ENCOUNTER — Encounter (HOSPITAL_COMMUNITY): Payer: Self-pay

## 2020-10-16 ENCOUNTER — Other Ambulatory Visit: Payer: Self-pay

## 2020-10-16 ENCOUNTER — Ambulatory Visit (HOSPITAL_COMMUNITY): Payer: Medicare HMO

## 2020-10-16 DIAGNOSIS — M545 Low back pain, unspecified: Secondary | ICD-10-CM

## 2020-10-16 DIAGNOSIS — M25552 Pain in left hip: Secondary | ICD-10-CM | POA: Diagnosis not present

## 2020-10-16 NOTE — Patient Instructions (Signed)
On Elbows (Prone)    Rise up on elbows as high as possible, keeping hips on floor. Hold 2 minutes. Repeat 2 times per day.  http://orth.exer.us/93   Copyright  VHI. All rights reserved.   Heel Squeeze (Prone)    Abdomen supported, bend knees and gently squeeze heels together. Hold 5 seconds. Repeat 10 times per set. Do 1 sets per session. Do 1 sessions per day.  http://orth.exer.us/1081   Copyright  VHI. All rights reserved.   Hip Extension: 2-4 Inches    Tighten gluteal muscle. Lift one leg 10times. Restabilize pelvis. Repeat with other leg.  Keep pelvis still. Be sure pelvis does not rotate and back does not arch. Do 2 sets, 1 time per day.  http://ss.exer.us/63   Copyright  VHI. All rights reserved.

## 2020-10-16 NOTE — Therapy (Signed)
[place], [place], [address] Phone: [phone]   Fax:  [phone]  Physical Therapy Treatment  Patient Details  Name: [name] MRN: [id number] Date of Birth: 08-04-1965 Referring Provider (PT): Lupe Carney MD   Encounter Date: 10/16/2020   PT End of Session - 10/16/20 0920     Visit Number 3    Number of Visits 12    Date for PT Re-Evaluation 11/20/20    Authorization Type [employer]    Authorization Time Period 12 visits approved 10/6-11/17    Authorization - Visit Number 3    Authorization - Number of Visits 12    Progress Note Due on Visit 10    PT Start Time 0912    PT Stop Time 0953    PT Time Calculation (min) 41 min    Activity Tolerance Patient tolerated treatment well    Behavior During Therapy [facility] for tasks assessed/performed             Past Medical History:  Diagnosis Date   Degenerative disk disease    GERD (gastroesophageal reflux disease)    Hypertension     Past Surgical History:  Procedure Laterality Date   ABDOMINAL SURGERY     APPENDECTOMY     CHOLECYSTECTOMY     COLONOSCOPY N/A 02/01/2014   2 SIMPLE ADNEOMA(HF), HYEPRPLASTIC RECTAL POLYPS   DILATION AND CURETTAGE OF UTERUS     NECK SURGERY     PARTIAL HYSTERECTOMY      There were no vitals filed for this visit.   Subjective Assessment - 10/16/20 0915     Subjective Reports her back slipped as getting out of car last week.  Has been on prednisone for 15 days.  Reports this medication messes with her ability to sleep.  Has a headache this morning, feels dehydrated.  Has been complaint wiht HEP daily though hasn't completed any exercises this morning as was helping nephew get ready.    Patient Stated Goals improve mobility    Currently in Pain? No/denies   headache 7/10                              [facility] Adult PT Treatment/Exercise - 10/16/20 0001       Exercises   Exercises  Lumbar      Lumbar Exercises: Stretches   Standing Extension 5 reps    Standing Extension Limitations pain free range    Prone on Elbows Stretch Limitations 2 min    Press Ups 5 reps;10 seconds    Press Ups Limitations UE weakness noted, no pain    Other Lumbar Stretch Exercise prone x 1 min      Lumbar Exercises: Standing   Heel Raises 10 reps      Lumbar Exercises: Seated   Sit to Stand 10 reps    Sit to Stand Limitations nose over toes, eccentric lowering      Lumbar Exercises: Supine   Dead Bug 10 reps    Dead Bug Limitations witih ab set    Bridge 10 reps    Bridge Limitations 2sets, narrow and WBOS      Lumbar Exercises: Sidelying   Clam 10 reps;Both;4 seconds    Clam Limitations GTB      Lumbar Exercises: Prone   Straight Leg Raise 10 reps    Other Prone Lumbar Exercises heel squeeze 10x 5"  PT Short Term Goals - 10/14/20 0952       PT SHORT TERM GOAL #1   Title Patient will be independent with initial HEP and self-management strategies to improve functional outcomes    Time 3    Period Weeks    Status On-going    Target Date 10/30/20               PT Long Term Goals - 10/14/20 0953       PT LONG TERM GOAL #1   Title Patient will improve FOTO score to predicted value to indicate improvement in functional outcomes    Time 6    Period Weeks    Status On-going      PT LONG TERM GOAL #2   Title Patient will report at least 75% overall improvement in subjective complaint to indicate improvement in ability to perform ADLs.    Time 6    Period Weeks    Status On-going      PT LONG TERM GOAL #3   Title Patient will be able to stand > 30 minutes with pain not to exceed 3/10 in lumbar to improve ability to perform cooking and grooming ADLs.    Time 6    Period Weeks    Status Partially Met      PT LONG TERM GOAL #4   Title Patient will have equal to or > 4/5 MMT throughout BLE to improve functional mobility, stair  ambulation, ADLs and ability to care for family members.    Time 6    Period Weeks    Status On-going                   Plan - 10/16/20 0925     Clinical Impression Statement Progressed abdominal and proximal strengthening wiht good tolerance.  Incrased abdominal demand wiht additional dead bug wiht min tactile cueing for core stability with task.  Added prone exercises for lumbar extension that was tolerated well, reports of tightness in back no pain.  Increased difficulty wiht prone pressups due to UE weakness    Examination-Activity Limitations Lift;Stand;Locomotion Level;Transfers;Bend;Sit;Stairs;Squat    Examination-Participation Restrictions Cleaning;Yard Work;Laundry;Shop;Community Activity    Stability/Clinical Decision Making Stable/Uncomplicated    Clinical Decision Making Low    Rehab Potential Good    PT Frequency 2x / week    PT Duration 6 weeks    PT Treatment/Interventions ADLs/Self Care Home Management;Biofeedback;Cryotherapy;Ultrasound;Parrafin;Fluidtherapy;Therapeutic activities;Functional mobility training;Neuromuscular re-education;Passive range of motion;Manual techniques;Dry needling;Energy conservation;Manual lymph drainage;Patient/family education;Splinting;Spinal Manipulations;Visual/perceptual remediation/compensation;Vestibular;Vasopneumatic Device;Joint Manipulations;Taping;Compression bandaging;Orthotic Fit/Training;Scar mobilization;Balance training;Therapeutic exercise;Contrast Bath;DME Instruction;Electrical Stimulation;Iontophoresis 64m/ml Dexamethasone;Gait training;Stair training;Moist Heat;Traction    PT Next Visit Plan Continue to progress abdominal stabilization.  Added 3D hip excursion (caution with standing extension) and progress core strength to paloff, lunges, standing marching wiht ab set,  balance activities as tolerated.    PT Home Exercise Plan Eval: Bridge, sidelying hip abduction, seated lumbar flexion; 10/16/20: POE, heel squeeze, hip  extension    Consulted and Agree with Plan of Care Patient             Patient will benefit from skilled therapeutic intervention in order to improve the following deficits and impairments:  Decreased balance, Impaired flexibility, Decreased activity tolerance, Decreased range of motion, Decreased strength, Hypomobility, Decreased mobility, Pain, Increased fascial restricitons  Visit Diagnosis: Pain in left hip  Low back pain, unspecified back pain laterality, unspecified chronicity, unspecified whether sciatica present     Problem List Patient Active Problem List  Diagnosis Date Noted   Primary osteoarthritis of left hip 11/18/2015   Chronic left hip pain 11/07/2015   Spondylosis without myelopathy or radiculopathy, lumbar region 11/07/2015   Special screening for malignant neoplasms, colon    Left leg weakness 10/02/2012   Unstable balance 10/02/2012   IMPINGEMENT SYNDROME 07/28/2009   SHOULDER STRAIN, LEFT 07/28/2009   Ihor Austin, LPTA/CLT; CBIS [phone]  Aldona Lento, PTA 10/16/2020, 9:57 AM  [place], [place], [address] Phone: [phone]   Fax:  [phone]  Name: [name] MRN: [id number] Date of Birth: [birth year]

## 2020-10-21 ENCOUNTER — Ambulatory Visit (HOSPITAL_COMMUNITY): Payer: Medicare HMO | Admitting: Physical Therapy

## 2020-10-23 ENCOUNTER — Ambulatory Visit (HOSPITAL_COMMUNITY): Payer: Medicare HMO

## 2020-10-28 ENCOUNTER — Telehealth (HOSPITAL_COMMUNITY): Payer: Self-pay | Admitting: Physical Therapy

## 2020-10-28 ENCOUNTER — Ambulatory Visit (HOSPITAL_COMMUNITY): Payer: Medicare HMO | Admitting: Physical Therapy

## 2020-10-28 NOTE — Telephone Encounter (Signed)
Pt did not show for appointment this session.  Noted missed appointments also on 10/18 and 10/20 but no communication was made.  Cancelled remaining appt and left message regarding NS policy.  Teena Irani, PTA/CLT, Lissa Morales [phone]

## 2020-10-30 ENCOUNTER — Telehealth (HOSPITAL_COMMUNITY): Payer: Self-pay | Admitting: Physical Therapy

## 2020-10-30 ENCOUNTER — Encounter (HOSPITAL_COMMUNITY): Payer: Medicare HMO | Admitting: Physical Therapy

## 2020-10-30 ENCOUNTER — Ambulatory Visit (HOSPITAL_COMMUNITY): Payer: Medicare HMO | Admitting: Physical Therapy

## 2020-10-30 NOTE — Telephone Encounter (Signed)
Left vm for patient she is d/c secondary to no shows.  8:24 AM,10/30/20 Domenic Moras, PT, DPT Physical Therapist at [facility]

## 2020-11-04 ENCOUNTER — Encounter (HOSPITAL_COMMUNITY): Payer: Medicare HMO | Admitting: Physical Therapy

## 2020-11-06 ENCOUNTER — Encounter (HOSPITAL_COMMUNITY): Payer: Medicare HMO | Admitting: Physical Therapy

## 2020-11-11 ENCOUNTER — Encounter (HOSPITAL_COMMUNITY): Payer: Medicare HMO | Admitting: Physical Therapy

## 2020-11-11 ENCOUNTER — Encounter: Payer: Medicare HMO | Attending: Physical Medicine & Rehabilitation | Admitting: Physical Medicine & Rehabilitation

## 2020-11-11 DIAGNOSIS — M47816 Spondylosis without myelopathy or radiculopathy, lumbar region: Secondary | ICD-10-CM | POA: Insufficient documentation

## 2020-11-11 DIAGNOSIS — M25552 Pain in left hip: Secondary | ICD-10-CM | POA: Insufficient documentation

## 2020-11-11 DIAGNOSIS — G8929 Other chronic pain: Secondary | ICD-10-CM | POA: Insufficient documentation

## 2020-11-11 DIAGNOSIS — M7062 Trochanteric bursitis, left hip: Secondary | ICD-10-CM | POA: Insufficient documentation

## 2020-11-13 ENCOUNTER — Encounter (HOSPITAL_COMMUNITY): Payer: Medicare HMO | Admitting: Physical Therapy

## 2020-12-04 NOTE — Progress Notes (Shared)
[name], [place] [address]   CLINIC:  Medical Oncology/Hematology  Patient Care Team: Sharilyn Sites, MD as PCP - General (Family Medicine)  CHIEF COMPLAINTS/PURPOSE OF CONSULTATION:  ***  HISTORY OF PRESENTING ILLNESS:  [name] 55 y.o. female is here because of ***, at the request of {REFERRING PHYSICIAN}.  ***She was found to have abnormal CBC from *** ***She denies recent chest pain on exertion, shortness of breath on minimal exertion, pre-syncopal episodes, or palpitations. ***She had not noticed any recent bleeding such as epistaxis, hematuria or hematochezia ***The patient denies over the counter NSAID ingestion. She is not *** on antiplatelets agents. Her last colonoscopy was *** ***She had no prior history or diagnosis of cancer. Her age appropriate screening programs are up-to-date. ***She denies any pica and eats a variety of diet. ***She never donated blood or received blood transfusion ***The patient was prescribed oral iron supplements and she takes ***  MEDICAL HISTORY:  Past Medical History:  Diagnosis Date   Degenerative disk disease    GERD (gastroesophageal reflux disease)    Hypertension     SURGICAL HISTORY: Past Surgical History:  Procedure Laterality Date   ABDOMINAL SURGERY     APPENDECTOMY     CHOLECYSTECTOMY     COLONOSCOPY N/A 02/01/2014   2 SIMPLE ADNEOMA(HF), HYEPRPLASTIC RECTAL POLYPS   DILATION AND CURETTAGE OF UTERUS     NECK SURGERY     PARTIAL HYSTERECTOMY      SOCIAL HISTORY: Social History   Socioeconomic History   Marital status: Widowed    Spouse name: Not on file   Number of children: Not on file   Years of education: Not on file   Highest education level: Not on file  Occupational History   Not on file  Tobacco Use   Smoking status: Every Day    Packs/day: 0.50    Types: Cigarettes   Smokeless tobacco: Never  Vaping Use   Vaping Use: Never used  Substance and Sexual  Activity   Alcohol use: No   Drug use: No   Sexual activity: Not on file  Other Topics Concern   Not on file  Social History Narrative   Not on file   Social Determinants of Health   Financial Resource Strain: Not on file  Food Insecurity: Not on file  Transportation Needs: Not on file  Physical Activity: Not on file  Stress: Not on file  Social Connections: Not on file  Intimate Partner Violence: Not on file    FAMILY HISTORY: Family History  Problem Relation Age of Onset   Hypotension Mother     ALLERGIES:  is allergic to amoxicillin-pot clavulanate, ampicillin-sulbactam sodium, ibuprofen, moxifloxacin, shellfish allergy, shellfish-derived products, and moxifloxacin hcl.  MEDICATIONS:  Current Outpatient Medications  Medication Sig Dispense Refill   baclofen (LIORESAL) 10 MG tablet Take 10 mg by mouth 1 day or 1 dose.  4   gabapentin (NEURONTIN) 100 MG capsule Take 1 capsule (100 mg total) by mouth 2 (two) times daily. 60 capsule 1   hydrochlorothiazide (HYDRODIURIL) 25 MG tablet      meclizine (ANTIVERT) 50 MG tablet Take 0.5 tablets (25 mg total) by mouth 3 (three) times daily as needed for dizziness or nausea. 21 tablet 0   triamcinolone (KENALOG) 0.1 %      No current facility-administered medications for this visit.    REVIEW OF SYSTEMS:   Review of Systems - Oncology   PHYSICAL EXAMINATION: ECOG  PERFORMANCE STATUS: {CHL ONC ECOG [phone]}  There were no vitals filed for this visit. There were no vitals filed for this visit. Physical Exam   LABORATORY DATA:  I have reviewed the data as listed No results found for this or any previous visit (from the past 2160 hour(s)).  RADIOGRAPHIC STUDIES: I have personally reviewed the radiological images as listed and agreed with the findings in the report. No results found.  ASSESSMENT:  ***   PLAN:  ***   All questions were answered. The patient knows to call the clinic with any problems, questions  or concerns.  Derek Jack, MD 12/04/20 5:45 PM  [place] [phone]   I, ***, am acting as a scribe for Dr. Derek Jack.  {Add Barista Statement}

## 2020-12-05 ENCOUNTER — Encounter (HOSPITAL_COMMUNITY): Payer: Medicare HMO | Admitting: Hematology

## 2020-12-05 DIAGNOSIS — D72829 Elevated white blood cell count, unspecified: Secondary | ICD-10-CM | POA: Insufficient documentation

## 2021-01-08 NOTE — Progress Notes (Signed)
[place], [place] [address]   CLINIC:  Medical Oncology/Hematology  Patient Care Team: Sharilyn Sites, MD as PCP - General (Family Medicine)  CHIEF COMPLAINTS/PURPOSE OF CONSULTATION:  Evaluation of leukocytosis  HISTORY OF PRESENTING ILLNESS:  [name] 56 y.o. female is here because of evaluation of leukocytosis, at the request of [facility].  Today she reports feeling good. She reports a history leukocytosis since 2010. She reports hot flashes and night sweats. She denies fevers, weight loss, and skin rash. She denies history of lupus and RA. She denies history of blood transfusions. She does not currently use triamcinolone cream. She uses prednisone about every 6 months for chronic back pain. She denies splenectomy.    She currently lives at home with her family. She is currently on disability, but previously she worked in Radio broadcast assistant. She is currently a smoker and has smoked 1 ppd for 30 years. She reports multiple cousins with sickle cell disease. Her brother passed from metastatic prostate cancer, her paternal aunt had stomach cancer, and her father had prostate cancer.    MEDICAL HISTORY:  Past Medical History:  Diagnosis Date   Degenerative disk disease    GERD (gastroesophageal reflux disease)    Hypertension     SURGICAL HISTORY: Past Surgical History:  Procedure Laterality Date   ABDOMINAL SURGERY     APPENDECTOMY     CHOLECYSTECTOMY     COLONOSCOPY N/A 02/01/2014   2 SIMPLE ADNEOMA(HF), HYEPRPLASTIC RECTAL POLYPS   DILATION AND CURETTAGE OF UTERUS     NECK SURGERY     PARTIAL HYSTERECTOMY      SOCIAL HISTORY: Social History   Socioeconomic History   Marital status: Widowed    Spouse name: Not on file   Number of children: Not on file   Years of education: Not on file   Highest education level: Not on file  Occupational History   Not on file  Tobacco Use   Smoking status: Every Day     Packs/day: 0.50    Types: Cigarettes   Smokeless tobacco: Never  Vaping Use   Vaping Use: Never used  Substance and Sexual Activity   Alcohol use: No   Drug use: No   Sexual activity: Not on file  Other Topics Concern   Not on file  Social History Narrative   Not on file   Social Determinants of Health   Financial Resource Strain: Not on file  Food Insecurity: Not on file  Transportation Needs: Not on file  Physical Activity: Not on file  Stress: Not on file  Social Connections: Not on file  Intimate Partner Violence: Not on file    FAMILY HISTORY: Family History  Problem Relation Age of Onset   Hypotension Mother     ALLERGIES:  is allergic to amoxicillin-pot clavulanate, ampicillin-sulbactam sodium, ibuprofen, moxifloxacin, shellfish allergy, shellfish-derived products, and moxifloxacin hcl.  MEDICATIONS:  Current Outpatient Medications  Medication Sig Dispense Refill   baclofen (LIORESAL) 10 MG tablet Take 10 mg by mouth 1 day or 1 dose.  4   gabapentin (NEURONTIN) 100 MG capsule Take 1 capsule (100 mg total) by mouth 2 (two) times daily. 60 capsule 1   hydrochlorothiazide (HYDRODIURIL) 25 MG tablet      rosuvastatin (CRESTOR) 10 MG tablet Take 10 mg by mouth at bedtime.     meclizine (ANTIVERT) 50 MG tablet Take 0.5 tablets (25 mg total) by mouth 3 (three) times daily as needed for  dizziness or nausea. (Patient not taking: Reported on 01/09/2021) 21 tablet 0   No current facility-administered medications for this visit.    REVIEW OF SYSTEMS:   Review of Systems  Constitutional:  Negative for appetite change, fatigue, fever and unexpected weight change.  Endocrine: Positive for hot flashes.  Musculoskeletal:  Positive for back pain (6/10).  Skin:  Negative for rash.  Neurological:  Positive for headaches and numbness.  All other systems reviewed and are negative.   PHYSICAL EXAMINATION: ECOG PERFORMANCE STATUS: 1 - Symptomatic but completely  ambulatory  Vitals:   01/09/21 1055  BP: 125/86  Pulse: 81  Resp: 18  Temp: 98 F (36.7 C)  SpO2: 98%   Filed Weights   01/09/21 1055  Weight: 174 lb (78.9 kg)   Physical Exam Vitals reviewed.  Constitutional:      Appearance: Normal appearance.  Cardiovascular:     Rate and Rhythm: Normal rate and regular rhythm.     Pulses: Normal pulses.     Heart sounds: Normal heart sounds.  Pulmonary:     Effort: Pulmonary effort is normal.     Breath sounds: Normal breath sounds.  Abdominal:     Palpations: Abdomen is soft. There is no hepatomegaly, splenomegaly or mass.     Tenderness: There is no abdominal tenderness.  Lymphadenopathy:     Upper Body:     Right upper body: No supraclavicular, axillary or pectoral adenopathy.     Left upper body: No supraclavicular, axillary or pectoral adenopathy.  Neurological:     General: No focal deficit present.     Mental Status: She is alert and oriented to person, place, and time.  Psychiatric:        Mood and Affect: Mood normal.        Behavior: Behavior normal.     LABORATORY DATA:  I have reviewed the data as listed No results found for this or any previous visit (from the past 2160 hour(s)).  RADIOGRAPHIC STUDIES: I have personally reviewed the radiological images as listed and agreed with the findings in the report. No results found.  ASSESSMENT:  Leukocytosis: - Patient seen at the request of Dr. Gerarda Fraction for leukocytosis. - Labs on 09/26/2020 showed white count 15, hemoglobin 14.7, platelet count 310 with normal differential.  Absolute neutrophil count, ALC, AMC were elevated. - Creatinine was mildly elevated at 1.24.  LFTs were normal.  Total protein was 7.0.  TSH was 2.5. - She does not report any fevers, night sweats or weight loss.  She does report some hot flashes at nighttime. - No prior history of connective tissue disorders.  No history of transfusion. - She reportedly saw Dr. Abran Duke in 2010 and no cause was  found.  I could not find records. - She is not on continuous systemic steroids.  The last prednisone was in October 2022.  She uses prednisone every 6 months on average for sinus infections and chronic back pain.  No prior history of splenectomy.  Social/family history: - She lives at home with her family.  She is currently on disability.  She worked in Charity fundraiser. - She is current active smoker for 30 years.  For few years she smoked 1 pack/day and currently smokes half pack per day. - Lot of first cousins had sickle cell disease. - Brother died of prostate cancer.  Father had prostate cancer.  Paternal aunt had stomach cancer.   PLAN:  Leukocytosis: - We have reviewed her labs over several years.  She has leukocytosis since 2009.  It is nonspecific with elevation of neutrophils, lymphocytes and monocytes. - Recommend checking flow cytometry, BCR/ABL by FISH and JAK2 V6 26F reflex mutation testing. - Recommend checking ESR/CRP, LDH, ANA/rheumatoid factor. - If all work-up is negative, likely related to smoking. - RTC 2 to 3 weeks to discuss results.   All questions were answered. The patient knows to call the clinic with any problems, questions or concerns.  Derek Jack, MD 01/09/21 11:33 AM  [place] [phone]   I, Thana Ates, am acting as a scribe for Dr. Derek Jack.  I, Derek Jack MD, have reviewed the above documentation for accuracy and completeness, and I agree with the above.

## 2021-01-09 ENCOUNTER — Other Ambulatory Visit (HOSPITAL_COMMUNITY): Admission: RE | Admit: 2021-01-09 | Payer: Medicare HMO | Source: Ambulatory Visit | Admitting: *Deleted

## 2021-01-09 ENCOUNTER — Inpatient Hospital Stay (HOSPITAL_COMMUNITY): Payer: Medicare HMO

## 2021-01-09 ENCOUNTER — Inpatient Hospital Stay (HOSPITAL_COMMUNITY): Payer: Medicare HMO | Attending: Hematology | Admitting: Hematology

## 2021-01-09 ENCOUNTER — Encounter (HOSPITAL_COMMUNITY): Payer: Self-pay | Admitting: Hematology

## 2021-01-09 ENCOUNTER — Other Ambulatory Visit: Payer: Self-pay

## 2021-01-09 VITALS — BP 125/86 | HR 81 | Temp 98.0°F | Resp 18 | Ht 67.0 in | Wt 174.0 lb

## 2021-01-09 DIAGNOSIS — R200 Anesthesia of skin: Secondary | ICD-10-CM | POA: Insufficient documentation

## 2021-01-09 DIAGNOSIS — Z832 Family history of diseases of the blood and blood-forming organs and certain disorders involving the immune mechanism: Secondary | ICD-10-CM | POA: Diagnosis not present

## 2021-01-09 DIAGNOSIS — R519 Headache, unspecified: Secondary | ICD-10-CM | POA: Insufficient documentation

## 2021-01-09 DIAGNOSIS — F17210 Nicotine dependence, cigarettes, uncomplicated: Secondary | ICD-10-CM | POA: Insufficient documentation

## 2021-01-09 DIAGNOSIS — Z8249 Family history of ischemic heart disease and other diseases of the circulatory system: Secondary | ICD-10-CM | POA: Insufficient documentation

## 2021-01-09 DIAGNOSIS — R232 Flushing: Secondary | ICD-10-CM | POA: Insufficient documentation

## 2021-01-09 DIAGNOSIS — Z8719 Personal history of other diseases of the digestive system: Secondary | ICD-10-CM | POA: Insufficient documentation

## 2021-01-09 DIAGNOSIS — D72829 Elevated white blood cell count, unspecified: Secondary | ICD-10-CM | POA: Diagnosis not present

## 2021-01-09 DIAGNOSIS — Z880 Allergy status to penicillin: Secondary | ICD-10-CM | POA: Diagnosis not present

## 2021-01-09 DIAGNOSIS — Z886 Allergy status to analgesic agent status: Secondary | ICD-10-CM | POA: Diagnosis not present

## 2021-01-09 DIAGNOSIS — Z800 Family history of malignant neoplasm of digestive organs: Secondary | ICD-10-CM | POA: Diagnosis not present

## 2021-01-09 DIAGNOSIS — Z79899 Other long term (current) drug therapy: Secondary | ICD-10-CM | POA: Insufficient documentation

## 2021-01-09 DIAGNOSIS — Z9049 Acquired absence of other specified parts of digestive tract: Secondary | ICD-10-CM | POA: Insufficient documentation

## 2021-01-09 DIAGNOSIS — M549 Dorsalgia, unspecified: Secondary | ICD-10-CM | POA: Diagnosis not present

## 2021-01-09 DIAGNOSIS — Z8042 Family history of malignant neoplasm of prostate: Secondary | ICD-10-CM | POA: Diagnosis not present

## 2021-01-09 LAB — LACTATE DEHYDROGENASE: LDH: 125 U/L (ref 98–192)

## 2021-01-09 LAB — CBC WITH DIFFERENTIAL/PLATELET
Abs Immature Granulocytes: 0.06 10*3/uL (ref 0.00–0.07)
Basophils Absolute: 0.1 10*3/uL (ref 0.0–0.1)
Basophils Relative: 0 %
Eosinophils Absolute: 0.2 10*3/uL (ref 0.0–0.5)
Eosinophils Relative: 1 %
HCT: 45.1 % (ref 36.0–46.0)
Hemoglobin: 14.4 g/dL (ref 12.0–15.0)
Immature Granulocytes: 1 %
Lymphocytes Relative: 33 %
Lymphs Abs: 4.1 10*3/uL — ABNORMAL HIGH (ref 0.7–4.0)
MCH: 28.2 pg (ref 26.0–34.0)
MCHC: 31.9 g/dL (ref 30.0–36.0)
MCV: 88.4 fL (ref 80.0–100.0)
Monocytes Absolute: 0.7 10*3/uL (ref 0.1–1.0)
Monocytes Relative: 6 %
Neutro Abs: 7.4 10*3/uL (ref 1.7–7.7)
Neutrophils Relative %: 59 %
Platelets: 275 10*3/uL (ref 150–400)
RBC: 5.1 MIL/uL (ref 3.87–5.11)
RDW: 12.5 % (ref 11.5–15.5)
WBC: 12.5 10*3/uL — ABNORMAL HIGH (ref 4.0–10.5)
nRBC: 0 % (ref 0.0–0.2)

## 2021-01-09 LAB — SEDIMENTATION RATE: Sed Rate: 24 mm/hr — ABNORMAL HIGH (ref 0–22)

## 2021-01-09 LAB — C-REACTIVE PROTEIN: CRP: 1.6 mg/dL — ABNORMAL HIGH (ref <1.0)

## 2021-01-09 NOTE — Patient Instructions (Addendum)
[name] at [facility] Discharge Instructions  You were seen and examined today by Dr. Delton Coombes. He is a Merchandiser, retail meaning that he specializes in the treatment of blood disorders and cancers. He discussed your medical history and the events that led up to you being here today. He reviewed your most recent labs and your white blood cell count is high. Please keep follow up appointment as scheduled.    Thank you for choosing [place] at [facility] to provide your oncology and hematology care.  To afford each patient quality time with our provider, please arrive at least 15 minutes before your scheduled appointment time.   If you have a lab appointment with the [facility] please come in thru the Main Entrance and check in at the main information desk.  You need to re-schedule your appointment should you arrive 10 or more minutes late.  We strive to give you quality time with our providers, and arriving late affects you and other patients whose appointments are after yours.  Also, if you no show three or more times for appointments you may be dismissed from the clinic at the providers discretion.     Again, thank you for choosing [facility].  Our hope is that these requests will decrease the amount of time that you wait before being seen by our physicians.       _____________________________________________________________  Should you have questions after your visit to [facility], please contact our office at [phone] and follow the prompts.  Our office hours are 8:00 a.m. and 4:30 p.m. Monday - Friday.  Please note that voicemails left after 4:00 p.m. may not be returned until the following business day.  We are closed weekends and major holidays.  You do have access to a nurse 24-7, just call the main number to the clinic [phone] and do not press any options, hold on the line and a nurse  will answer the phone.    For prescription refill requests, have your pharmacy contact our office and allow 72 hours.    Due to Covid, you will need to wear a mask upon entering the hospital. If you do not have a mask, a mask will be given to you at the Main Entrance upon arrival. For doctor visits, patients may have 1 support person age 2 or older with them. For treatment visits, patients can not have anyone with them due to social distancing guidelines and our immunocompromised population.

## 2021-01-10 LAB — RHEUMATOID FACTOR: Rheumatoid fact SerPl-aCnc: <10 IU/mL (ref <14.0)

## 2021-01-12 LAB — PATHOLOGIST SMEAR REVIEW

## 2021-01-12 LAB — SURGICAL PATHOLOGY

## 2021-01-12 LAB — ANTINUCLEAR ANTIBODIES, IFA: ANA Ab, IFA: NEGATIVE

## 2021-01-13 LAB — BCR-ABL1 FISH
Cells Analyzed: 200
Cells Counted: 200

## 2021-01-16 LAB — CALR + JAK2 E12-15 + MPL (REFLEXED)

## 2021-01-16 LAB — JAK2 V617F, W REFLEX TO CALR/E12/MPL

## 2021-01-29 NOTE — Progress Notes (Signed)
[name], [place] [address]   CLINIC:  Medical Oncology/Hematology  PCP:  Sharilyn Sites, [address] [place] [address] [phone]   REASON FOR VISIT:  Follow-up for leukocytosis  PRIOR THERAPY: None  CURRENT THERAPY: Under work-up  INTERVAL HISTORY:  [name] 56 y.o. female returns for routine follow-up of her leukocytosis.  She was seen for initial consultation by Dr. Delton Coombes on 01/09/2021.  At today's visit, she reports feeling fair.  No recent hospitalizations, surgeries, or changes in baseline health status.  She denies any B symptoms such as fever, chills, night sweats, or unintentional weight loss.  She has not noticed any new lumps or bumps.  She denies any frequent infections or recent steroid use.  She does have some fatigue with about 50% energy.  She reports that her appetite is 70%.  She endorses that she is maintaining a stable weight.   REVIEW OF SYSTEMS:  Review of Systems  Constitutional:  Positive for fatigue. Negative for appetite change, chills, diaphoresis, fever and unexpected weight change.  HENT:   Negative for lump/mass and nosebleeds.   Eyes:  Negative for eye problems.  Respiratory:  Negative for cough, hemoptysis and shortness of breath.   Cardiovascular:  Negative for chest pain, leg swelling and palpitations.  Gastrointestinal:  Positive for nausea. Negative for abdominal pain, blood in stool, constipation, diarrhea and vomiting.  Genitourinary:  Negative for hematuria.   Skin: Negative.   Neurological:  Positive for headaches and numbness. Negative for dizziness and light-headedness.  Hematological:  Does not bruise/bleed easily.     PAST MEDICAL/SURGICAL HISTORY:  Past Medical History:  Diagnosis Date   Degenerative disk disease    GERD (gastroesophageal reflux disease)    Hypertension    Past Surgical History:  Procedure Laterality Date   ABDOMINAL SURGERY     APPENDECTOMY      CHOLECYSTECTOMY     COLONOSCOPY N/A 02/01/2014   2 SIMPLE ADNEOMA(HF), HYEPRPLASTIC RECTAL POLYPS   DILATION AND CURETTAGE OF UTERUS     NECK SURGERY     PARTIAL HYSTERECTOMY       SOCIAL HISTORY:  Social History   Socioeconomic History   Marital status: Widowed    Spouse name: Not on file   Number of children: Not on file   Years of education: Not on file   Highest education level: Not on file  Occupational History   Not on file  Tobacco Use   Smoking status: Every Day    Packs/day: 0.50    Types: Cigarettes   Smokeless tobacco: Never  Vaping Use   Vaping Use: Never used  Substance and Sexual Activity   Alcohol use: No   Drug use: No   Sexual activity: Not on file  Other Topics Concern   Not on file  Social History Narrative   Not on file   Social Determinants of Health   Financial Resource Strain: Not on file  Food Insecurity: Not on file  Transportation Needs: Not on file  Physical Activity: Not on file  Stress: Not on file  Social Connections: Not on file  Intimate Partner Violence: Not on file    FAMILY HISTORY:  Family History  Problem Relation Age of Onset   Hypotension Mother     CURRENT MEDICATIONS:  Outpatient Encounter Medications as of 01/30/2021  Medication Sig Note   baclofen (LIORESAL) 10 MG tablet Take 10 mg by mouth 1 day or 1 dose. 11/07/2015: Received  from: External Pharmacy   gabapentin (NEURONTIN) 100 MG capsule Take 1 capsule (100 mg total) by mouth 2 (two) times daily.    hydrochlorothiazide (HYDRODIURIL) 25 MG tablet  11/07/2015: Received from: External Pharmacy   meclizine (ANTIVERT) 50 MG tablet Take 0.5 tablets (25 mg total) by mouth 3 (three) times daily as needed for dizziness or nausea. (Patient not taking: Reported on 01/09/2021)    rosuvastatin (CRESTOR) 10 MG tablet Take 10 mg by mouth at bedtime.    No facility-administered encounter medications on file as of 01/30/2021.    ALLERGIES:  Allergies  Allergen Reactions    Amoxicillin-Pot Clavulanate Nausea And Vomiting and Hives   Ampicillin-Sulbactam Sodium Hives and Swelling   Ibuprofen Hives and Swelling   Moxifloxacin Nausea Only   Shellfish Allergy Hives and Swelling   Shellfish-Derived Products Hives and Itching   Moxifloxacin Hcl Hives     PHYSICAL EXAM:  ECOG PERFORMANCE STATUS: 1 - Symptomatic but completely ambulatory  There were no vitals filed for this visit. There were no vitals filed for this visit. Physical Exam Constitutional:      Appearance: Normal appearance.  HENT:     Head: Normocephalic and atraumatic.     Mouth/Throat:     Mouth: Mucous membranes are moist.  Eyes:     Extraocular Movements: Extraocular movements intact.     Pupils: Pupils are equal, round, and reactive to light.  Cardiovascular:     Rate and Rhythm: Normal rate and regular rhythm.     Pulses: Normal pulses.     Heart sounds: Normal heart sounds.  Pulmonary:     Effort: Pulmonary effort is normal.     Breath sounds: Normal breath sounds.  Abdominal:     General: Bowel sounds are normal.     Palpations: Abdomen is soft.     Tenderness: There is no abdominal tenderness.  Musculoskeletal:        General: No swelling.     Right lower leg: No edema.     Left lower leg: No edema.  Lymphadenopathy:     Cervical: No cervical adenopathy.  Skin:    General: Skin is warm and dry.  Neurological:     General: No focal deficit present.     Mental Status: She is alert and oriented to person, place, and time.  Psychiatric:        Mood and Affect: Mood normal.        Behavior: Behavior normal.     LABORATORY DATA:  I have reviewed the labs as listed.  CBC    Component Value Date/Time   WBC 12.5 (H) 01/09/2021 1241   RBC 5.10 01/09/2021 1241   HGB 14.4 01/09/2021 1241   HCT 45.1 01/09/2021 1241   PLT 275 01/09/2021 1241   MCV 88.4 01/09/2021 1241   MCH 28.2 01/09/2021 1241   MCHC 31.9 01/09/2021 1241   RDW 12.5 01/09/2021 1241   LYMPHSABS 4.1 (H)  01/09/2021 1241   MONOABS 0.7 01/09/2021 1241   EOSABS 0.2 01/09/2021 1241   BASOSABS 0.1 01/09/2021 1241   CMP Latest Ref Rng & Units 02/16/2015 11/26/2008 04/24/2008  Glucose 65 - 99 mg/dL 95 83 76  BUN 6 - 20 mg/dL _0 Creatinine 0.44 - 1.00 mg/dL 1.20(H) 0.99 0.91  Sodium 135 - 145 mmol/L 139 138 140  Potassium 3.5 - 5.1 mmol/L 3.2(L) 4.0 3.6  Chloride 101 - 111 mmol/L 100(L) 106 106  CO2 22 - 32 mmol/L 29 25 30  Calcium 8.9 - 10.3 mg/dL 9.3 8.9 9.0  Total Protein 6.5 - 8.1 g/dL 7.1 - 6.6  Total Bilirubin 0.3 - 1.2 mg/dL 0.4 - 0.4  Alkaline Phos 38 - 126 U/L 94 - 90  AST 15 - 41 U/L 18 - 15  ALT 14 - 54 U/L 25 - 13    DIAGNOSTIC IMAGING:  I have independently reviewed the relevant imaging and discussed with the patient.  ASSESSMENT & PLAN: 1.  Leukocytosis: - Patient seen at the request of Dr. Gerarda Fraction for leukocytosis. - She has leukocytosis since 2009.  It is nonspecific with elevation of neutrophils, lymphocytes and monocytes. - Labs on 09/26/2020 showed white count 15, hemoglobin 14.7, platelet count 310 with normal differential.  Absolute neutrophil count, ALC, AMC were elevated. - No prior history of connective tissue disorders/autoimmune disease.  No history of splenectomy. -Requires occasional steroids for sinus infection and back pain, most recently in October 2022 - She smokes 0.5 PPD cigarettes - She does not report any fevers, night sweats or weight loss.  She does report some hot flashes at nighttime.   - Hematology work-up (01/09/2021): JAK2 with reflex negative.  BCR/ABL FISH negative.  Flow cytometry negative.  Mildly elevated ESR (24) and CRP (1.6).  Normal LDH.  Negative rheumatoid factor, ANA. - Most recent CBC (01/09/2021): WBC 12.5 with absolute lymphocytes 4.1 - MPN work-up negative.  Leukocytosis is likely reactive secondary to smoking. - PLAN: Given negative MPN work-up and overall stability of leukocytosis over 10+ years, we will discharge her to PCP.  She  should be referred back to [place] in the future if she has persistently elevated WBC greater than 20,000 or any leukocytosis with development of B symptoms.  2.  Social/family history: - She lives at home with her family.  She is currently on disability.  She worked in Charity fundraiser. - She is current active smoker for 30 years.  For few years she smoked 1 pack/day and currently smokes half pack per day. - Lot of first cousins had sickle cell disease. - Brother died of prostate cancer.  Father had prostate cancer.  Paternal aunt had stomach cancer.   All questions were answered. The patient knows to call the clinic with any problems, questions or concerns.  Medical decision making: Low  Time spent on visit: I spent 15 minutes counseling the patient face to face. The total time spent in the appointment was 20 minutes and more than 50% was on counseling.   Harriett Rush, PA-C  01/30/2021 9:50 AM

## 2021-01-30 ENCOUNTER — Other Ambulatory Visit: Payer: Self-pay

## 2021-01-30 ENCOUNTER — Inpatient Hospital Stay (HOSPITAL_COMMUNITY): Payer: Medicare HMO | Admitting: Physician Assistant

## 2021-01-30 VITALS — BP 117/83 | HR 89 | Temp 97.7°F | Resp 16 | Ht 67.0 in | Wt 173.1 lb

## 2021-01-30 DIAGNOSIS — Z8719 Personal history of other diseases of the digestive system: Secondary | ICD-10-CM | POA: Diagnosis not present

## 2021-01-30 DIAGNOSIS — M549 Dorsalgia, unspecified: Secondary | ICD-10-CM | POA: Diagnosis not present

## 2021-01-30 DIAGNOSIS — Z880 Allergy status to penicillin: Secondary | ICD-10-CM | POA: Diagnosis not present

## 2021-01-30 DIAGNOSIS — Z79899 Other long term (current) drug therapy: Secondary | ICD-10-CM | POA: Diagnosis not present

## 2021-01-30 DIAGNOSIS — R200 Anesthesia of skin: Secondary | ICD-10-CM | POA: Diagnosis not present

## 2021-01-30 DIAGNOSIS — D72829 Elevated white blood cell count, unspecified: Secondary | ICD-10-CM | POA: Diagnosis not present

## 2021-01-30 DIAGNOSIS — F17210 Nicotine dependence, cigarettes, uncomplicated: Secondary | ICD-10-CM | POA: Diagnosis not present

## 2021-01-30 DIAGNOSIS — R519 Headache, unspecified: Secondary | ICD-10-CM | POA: Diagnosis not present

## 2021-01-30 DIAGNOSIS — R232 Flushing: Secondary | ICD-10-CM | POA: Diagnosis not present

## 2021-01-30 LAB — FLOW CYTOMETRY

## 2021-01-30 NOTE — Patient Instructions (Signed)
[place] at [facility] Discharge Instructions  You were seen today by Tarri Abernethy PA-C for your elevated white blood cells (leukocytosis).  You have had elevated white blood cells for over 10 years, and they have been stable in the mildly elevated range.  The blood work that we checked did not show any genetic or malignant causes of elevated white blood cells.  We suspect that your elevated white blood cells are due to inflammation caused by smoking.  You do not need to continue to follow with our clinic.  You should continue to follow with your primary care provider with labs checked at least once per year.  You should be referred back to [place] if you have elevated white blood cell > 20.  Please see the attached handout for information regarding smoking cessation.   Thank you for choosing [place] at [facility] to provide your oncology and hematology care.  To afford each patient quality time with our provider, please arrive at least 15 minutes before your scheduled appointment time.   If you have a lab appointment with the [facility] please come in thru the Main Entrance and check in at the main information desk.  You need to re-schedule your appointment should you arrive 10 or more minutes late.  We strive to give you quality time with our providers, and arriving late affects you and other patients whose appointments are after yours.  Also, if you no show three or more times for appointments you may be dismissed from the clinic at the providers discretion.     Again, thank you for choosing [facility].  Our hope is that these requests will decrease the amount of time that you wait before being seen by our physicians.       _____________________________________________________________  Should you have questions after your visit to [facility], please contact our office at [phone] and follow the prompts.   Our office hours are 8:00 a.m. and 4:30 p.m. Monday - Friday.  Please note that voicemails left after 4:00 p.m. may not be returned until the following business day.  We are closed weekends and major holidays.  You do have access to a nurse 24-7, just call the main number to the clinic [phone] and do not press any options, hold on the line and a nurse will answer the phone.    For prescription refill requests, have your pharmacy contact our office and allow 72 hours.    Due to Covid, you will need to wear a mask upon entering the hospital. If you do not have a mask, a mask will be given to you at the Main Entrance upon arrival. For doctor visits, patients may have 1 support person age 7 or older with them. For treatment visits, patients can not have anyone with them due to social distancing guidelines and our immunocompromised population.

## 2021-02-11 DIAGNOSIS — E663 Overweight: Secondary | ICD-10-CM | POA: Diagnosis not present

## 2021-02-11 DIAGNOSIS — M25512 Pain in left shoulder: Secondary | ICD-10-CM | POA: Diagnosis not present

## 2021-02-11 DIAGNOSIS — Z23 Encounter for immunization: Secondary | ICD-10-CM | POA: Diagnosis not present

## 2021-02-11 DIAGNOSIS — Z6828 Body mass index (BMI) 28.0-28.9, adult: Secondary | ICD-10-CM | POA: Diagnosis not present

## 2021-02-11 DIAGNOSIS — M67912 Unspecified disorder of synovium and tendon, left shoulder: Secondary | ICD-10-CM | POA: Diagnosis not present

## 2021-04-01 IMAGING — DX DG FINGER INDEX 2+V*R*
4 series · 4 of 4 positions shown · non-contrast
Comparison: None.

CLINICAL DATA: Recent crush injury to the second digit, initial
encounter

EXAM:
RIGHT INDEX FINGER 2+V

[finger ap]
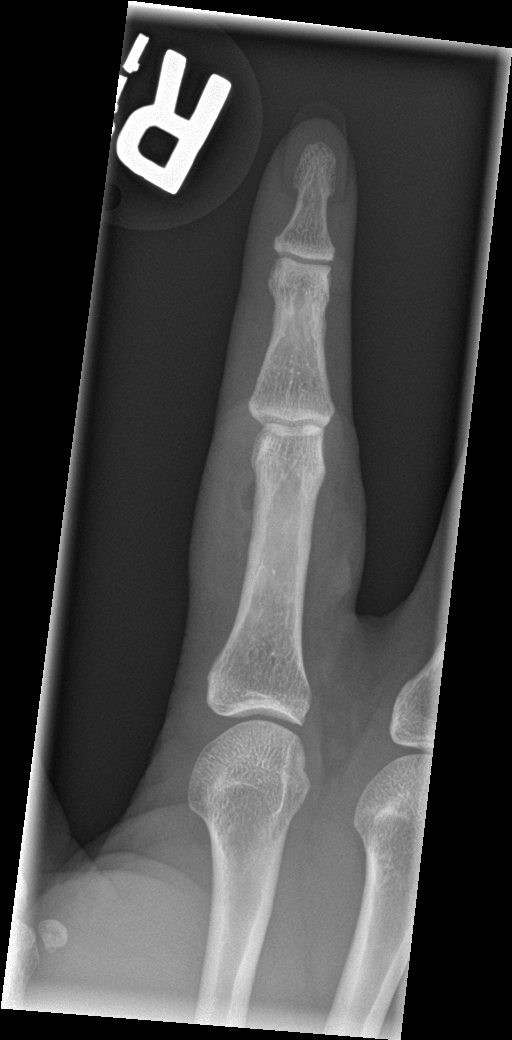

[finger obl]
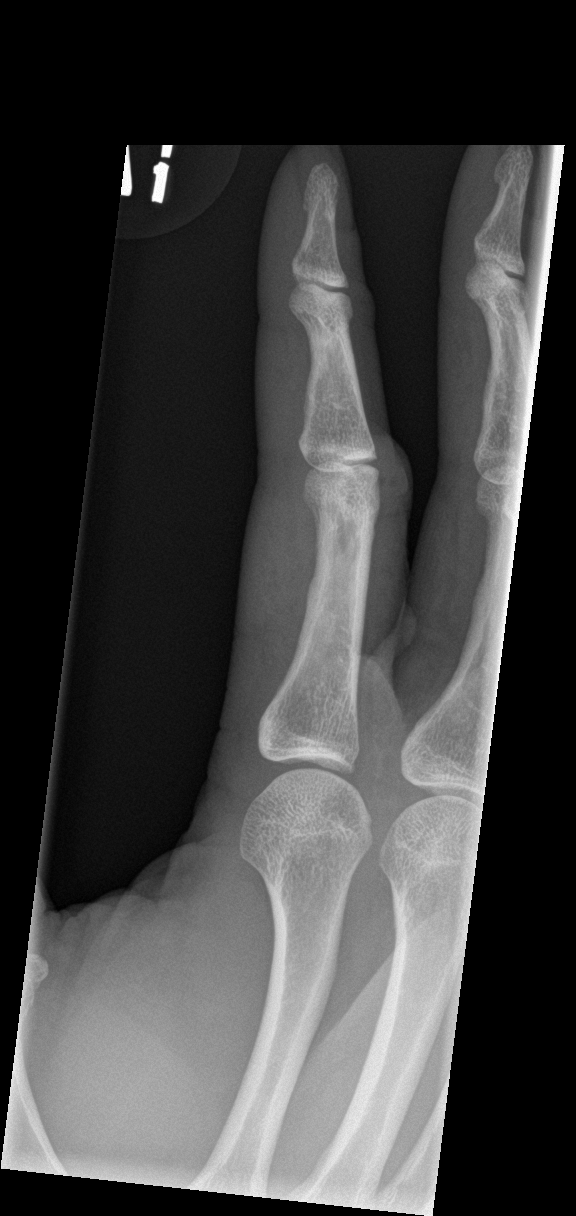

[finger lat (1 of 2)]
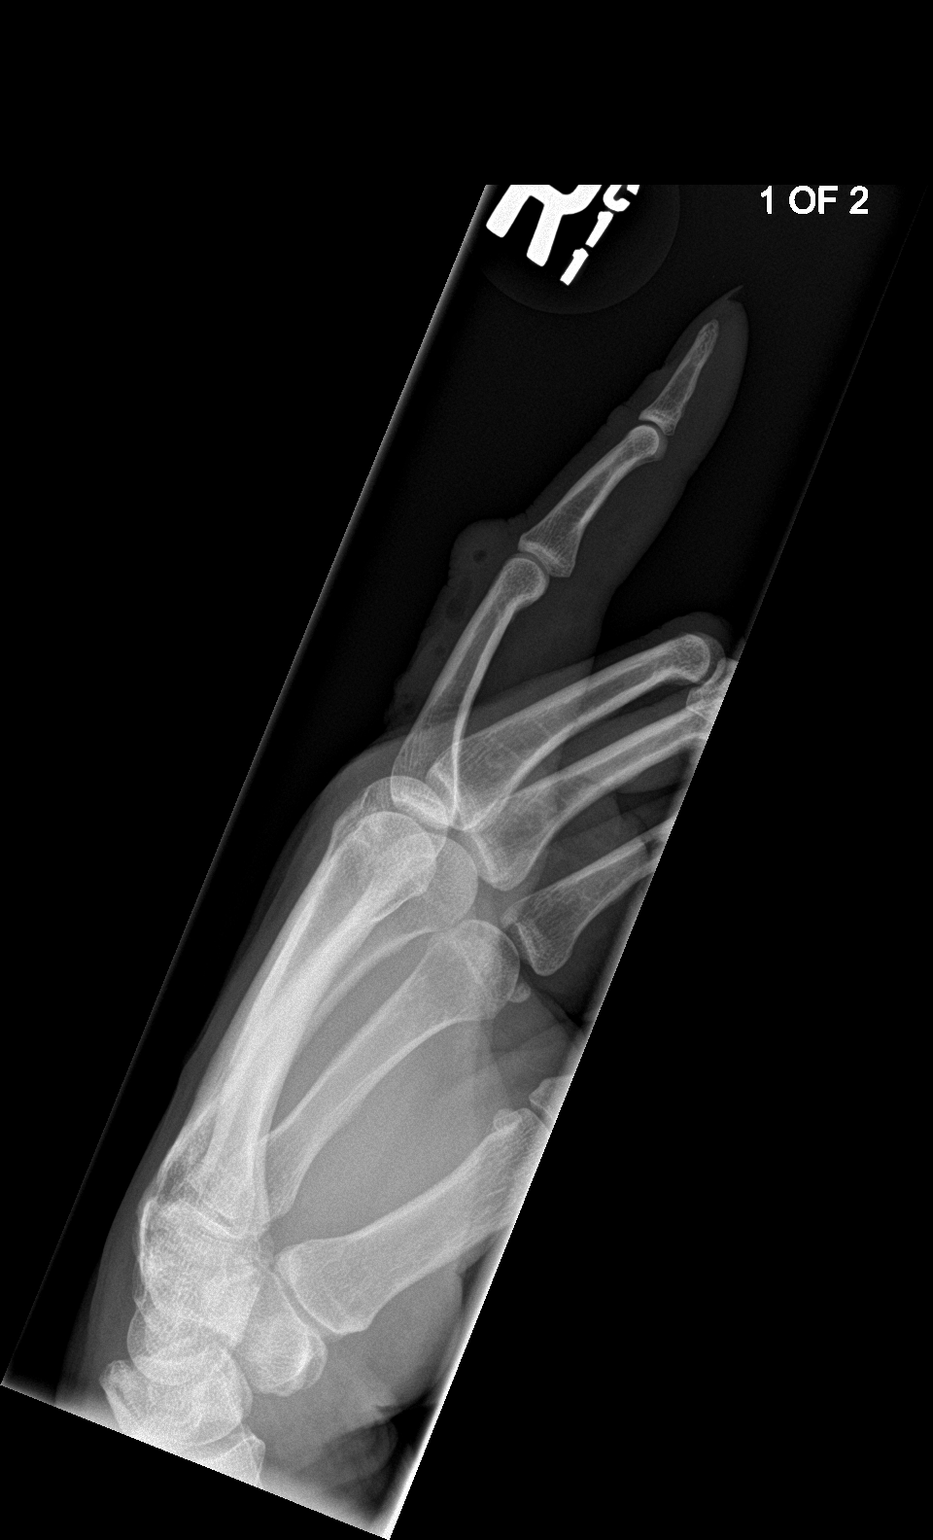

[finger lat (2 of 2)]
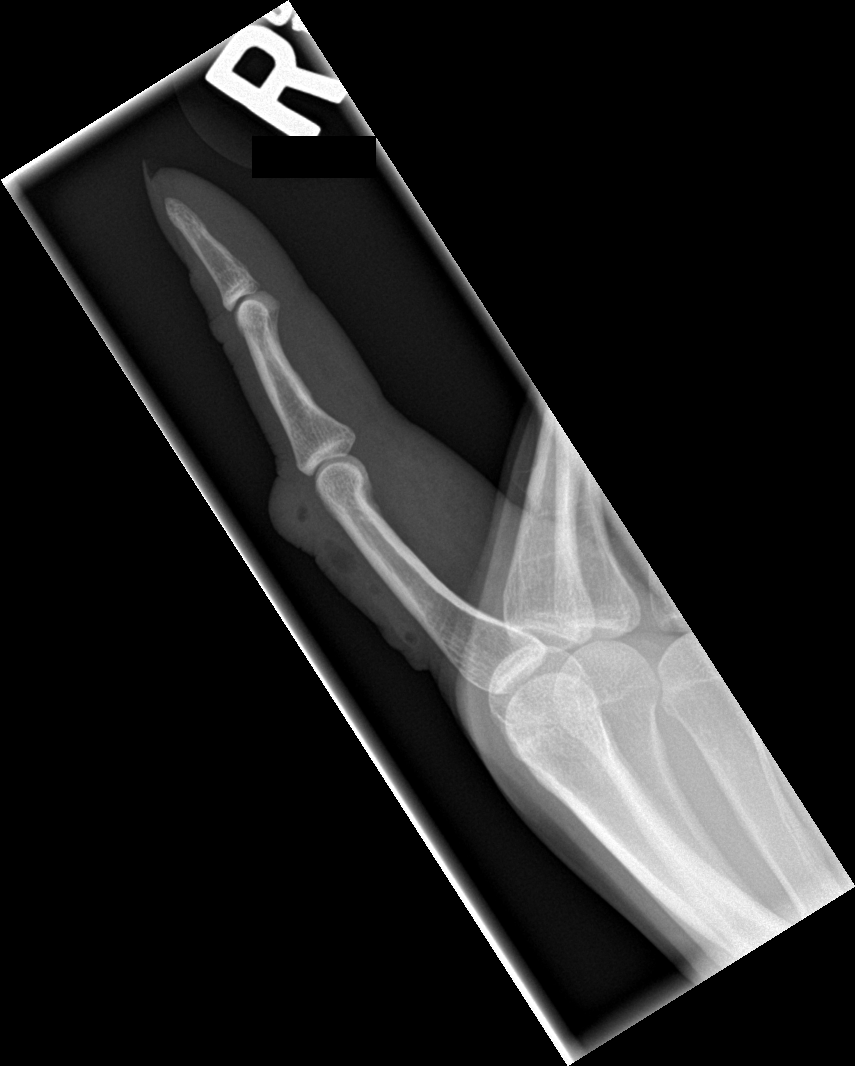

[4 of 4 positions shown; findings below may reference images not displayed]

FINDINGS: Considerable soft tissue swelling is noted in the proximal aspect of
the second digit. No definitive radiopaque foreign body is seen. No
acute fracture is noted. Air is noted in the subcutaneous tissues
related to the recent injury.
IMPRESSION: Soft tissue swelling and subcutaneous air consistent with the recent
injury.

No acute bony abnormality is noted.

## 2021-04-20 DIAGNOSIS — M5090 Cervical disc disorder, unspecified, unspecified cervical region: Secondary | ICD-10-CM | POA: Diagnosis not present

## 2021-04-20 DIAGNOSIS — E663 Overweight: Secondary | ICD-10-CM | POA: Diagnosis not present

## 2021-04-20 DIAGNOSIS — Z6828 Body mass index (BMI) 28.0-28.9, adult: Secondary | ICD-10-CM | POA: Diagnosis not present

## 2021-04-22 ENCOUNTER — Other Ambulatory Visit (HOSPITAL_COMMUNITY): Payer: Self-pay | Admitting: Family Medicine

## 2021-04-22 ENCOUNTER — Other Ambulatory Visit: Payer: Self-pay | Admitting: Family Medicine

## 2021-04-22 DIAGNOSIS — M5090 Cervical disc disorder, unspecified, unspecified cervical region: Secondary | ICD-10-CM

## 2021-05-08 ENCOUNTER — Ambulatory Visit (HOSPITAL_COMMUNITY)
Admission: RE | Admit: 2021-05-08 | Discharge: 2021-05-08 | Disposition: A | Discharge status: Home / Self Care | Payer: Medicare HMO | Source: Ambulatory Visit | Attending: Family Medicine | Admitting: Family Medicine

## 2021-05-08 DIAGNOSIS — M542 Cervicalgia: Secondary | ICD-10-CM | POA: Diagnosis not present

## 2021-05-08 DIAGNOSIS — M5090 Cervical disc disorder, unspecified, unspecified cervical region: Secondary | ICD-10-CM | POA: Insufficient documentation

## 2021-05-19 DIAGNOSIS — Z6828 Body mass index (BMI) 28.0-28.9, adult: Secondary | ICD-10-CM | POA: Diagnosis not present

## 2021-05-19 DIAGNOSIS — M4722 Other spondylosis with radiculopathy, cervical region: Secondary | ICD-10-CM | POA: Diagnosis not present

## 2021-05-19 DIAGNOSIS — M542 Cervicalgia: Secondary | ICD-10-CM | POA: Diagnosis not present

## 2021-05-26 DIAGNOSIS — M2569 Stiffness of other specified joint, not elsewhere classified: Secondary | ICD-10-CM | POA: Diagnosis not present

## 2021-05-26 DIAGNOSIS — R293 Abnormal posture: Secondary | ICD-10-CM | POA: Diagnosis not present

## 2021-05-26 DIAGNOSIS — R531 Weakness: Secondary | ICD-10-CM | POA: Diagnosis not present

## 2021-05-26 DIAGNOSIS — M542 Cervicalgia: Secondary | ICD-10-CM | POA: Diagnosis not present

## 2021-05-27 DIAGNOSIS — R293 Abnormal posture: Secondary | ICD-10-CM | POA: Diagnosis not present

## 2021-05-27 DIAGNOSIS — R531 Weakness: Secondary | ICD-10-CM | POA: Diagnosis not present

## 2021-05-27 DIAGNOSIS — M542 Cervicalgia: Secondary | ICD-10-CM | POA: Diagnosis not present

## 2021-05-27 DIAGNOSIS — M2569 Stiffness of other specified joint, not elsewhere classified: Secondary | ICD-10-CM | POA: Diagnosis not present

## 2021-06-04 DIAGNOSIS — R293 Abnormal posture: Secondary | ICD-10-CM | POA: Diagnosis not present

## 2021-06-04 DIAGNOSIS — M542 Cervicalgia: Secondary | ICD-10-CM | POA: Diagnosis not present

## 2021-06-04 DIAGNOSIS — M2569 Stiffness of other specified joint, not elsewhere classified: Secondary | ICD-10-CM | POA: Diagnosis not present

## 2021-06-04 DIAGNOSIS — R531 Weakness: Secondary | ICD-10-CM | POA: Diagnosis not present

## 2021-06-05 DIAGNOSIS — M542 Cervicalgia: Secondary | ICD-10-CM | POA: Diagnosis not present

## 2021-06-05 DIAGNOSIS — R293 Abnormal posture: Secondary | ICD-10-CM | POA: Diagnosis not present

## 2021-06-05 DIAGNOSIS — R531 Weakness: Secondary | ICD-10-CM | POA: Diagnosis not present

## 2021-06-05 DIAGNOSIS — M2569 Stiffness of other specified joint, not elsewhere classified: Secondary | ICD-10-CM | POA: Diagnosis not present

## 2021-06-29 DIAGNOSIS — M4802 Spinal stenosis, cervical region: Secondary | ICD-10-CM | POA: Diagnosis not present

## 2021-06-29 DIAGNOSIS — M4722 Other spondylosis with radiculopathy, cervical region: Secondary | ICD-10-CM | POA: Diagnosis not present

## 2021-06-29 DIAGNOSIS — M47812 Spondylosis without myelopathy or radiculopathy, cervical region: Secondary | ICD-10-CM | POA: Diagnosis not present

## 2021-07-21 DIAGNOSIS — M4722 Other spondylosis with radiculopathy, cervical region: Secondary | ICD-10-CM | POA: Diagnosis not present

## 2021-09-15 DIAGNOSIS — R7309 Other abnormal glucose: Secondary | ICD-10-CM | POA: Diagnosis not present

## 2021-09-15 DIAGNOSIS — Z6827 Body mass index (BMI) 27.0-27.9, adult: Secondary | ICD-10-CM | POA: Diagnosis not present

## 2021-09-15 DIAGNOSIS — Z0001 Encounter for general adult medical examination with abnormal findings: Secondary | ICD-10-CM | POA: Diagnosis not present

## 2021-09-15 DIAGNOSIS — M1991 Primary osteoarthritis, unspecified site: Secondary | ICD-10-CM | POA: Diagnosis not present

## 2021-09-15 DIAGNOSIS — D72829 Elevated white blood cell count, unspecified: Secondary | ICD-10-CM | POA: Diagnosis not present

## 2021-09-15 DIAGNOSIS — R945 Abnormal results of liver function studies: Secondary | ICD-10-CM | POA: Diagnosis not present

## 2021-09-15 DIAGNOSIS — E782 Mixed hyperlipidemia: Secondary | ICD-10-CM | POA: Diagnosis not present

## 2021-09-15 DIAGNOSIS — Z1331 Encounter for screening for depression: Secondary | ICD-10-CM | POA: Diagnosis not present

## 2021-09-15 DIAGNOSIS — I10 Essential (primary) hypertension: Secondary | ICD-10-CM | POA: Diagnosis not present

## 2021-09-15 DIAGNOSIS — D509 Iron deficiency anemia, unspecified: Secondary | ICD-10-CM | POA: Diagnosis not present

## 2021-10-02 DIAGNOSIS — U071 COVID-19: Secondary | ICD-10-CM | POA: Diagnosis not present

## 2021-10-02 DIAGNOSIS — J45909 Unspecified asthma, uncomplicated: Secondary | ICD-10-CM | POA: Diagnosis not present

## 2021-10-02 DIAGNOSIS — I10 Essential (primary) hypertension: Secondary | ICD-10-CM | POA: Diagnosis not present

## 2021-10-23 DIAGNOSIS — E663 Overweight: Secondary | ICD-10-CM | POA: Diagnosis not present

## 2021-10-23 DIAGNOSIS — Z6828 Body mass index (BMI) 28.0-28.9, adult: Secondary | ICD-10-CM | POA: Diagnosis not present

## 2021-10-23 DIAGNOSIS — H6980 Other specified disorders of Eustachian tube, unspecified ear: Secondary | ICD-10-CM | POA: Diagnosis not present

## 2021-12-01 DIAGNOSIS — M4722 Other spondylosis with radiculopathy, cervical region: Secondary | ICD-10-CM | POA: Diagnosis not present

## 2021-12-01 DIAGNOSIS — Z6827 Body mass index (BMI) 27.0-27.9, adult: Secondary | ICD-10-CM | POA: Diagnosis not present

## 2021-12-01 DIAGNOSIS — M5459 Other low back pain: Secondary | ICD-10-CM | POA: Diagnosis not present

## 2021-12-11 DIAGNOSIS — M542 Cervicalgia: Secondary | ICD-10-CM | POA: Diagnosis not present

## 2021-12-11 DIAGNOSIS — M4727 Other spondylosis with radiculopathy, lumbosacral region: Secondary | ICD-10-CM | POA: Diagnosis not present

## 2021-12-17 DIAGNOSIS — M5136 Other intervertebral disc degeneration, lumbar region: Secondary | ICD-10-CM | POA: Diagnosis not present

## 2021-12-17 DIAGNOSIS — Z6827 Body mass index (BMI) 27.0-27.9, adult: Secondary | ICD-10-CM | POA: Diagnosis not present

## 2021-12-17 DIAGNOSIS — E663 Overweight: Secondary | ICD-10-CM | POA: Diagnosis not present

## 2022-03-18 DIAGNOSIS — Z6828 Body mass index (BMI) 28.0-28.9, adult: Secondary | ICD-10-CM | POA: Diagnosis not present

## 2022-03-18 DIAGNOSIS — M25551 Pain in right hip: Secondary | ICD-10-CM | POA: Diagnosis not present

## 2022-03-18 DIAGNOSIS — E663 Overweight: Secondary | ICD-10-CM | POA: Diagnosis not present

## 2022-04-13 DIAGNOSIS — J45909 Unspecified asthma, uncomplicated: Secondary | ICD-10-CM | POA: Diagnosis not present

## 2022-04-13 DIAGNOSIS — J069 Acute upper respiratory infection, unspecified: Secondary | ICD-10-CM | POA: Diagnosis not present

## 2022-04-13 DIAGNOSIS — E663 Overweight: Secondary | ICD-10-CM | POA: Diagnosis not present

## 2022-04-13 DIAGNOSIS — Z20828 Contact with and (suspected) exposure to other viral communicable diseases: Secondary | ICD-10-CM | POA: Diagnosis not present

## 2022-04-13 DIAGNOSIS — Z6828 Body mass index (BMI) 28.0-28.9, adult: Secondary | ICD-10-CM | POA: Diagnosis not present

## 2022-04-13 DIAGNOSIS — R6889 Other general symptoms and signs: Secondary | ICD-10-CM | POA: Diagnosis not present

## 2022-06-01 DIAGNOSIS — M4722 Other spondylosis with radiculopathy, cervical region: Secondary | ICD-10-CM | POA: Diagnosis not present

## 2022-06-02 DIAGNOSIS — H524 Presbyopia: Secondary | ICD-10-CM | POA: Diagnosis not present

## 2022-06-02 DIAGNOSIS — Z0100 Encounter for examination of eyes and vision without abnormal findings: Secondary | ICD-10-CM | POA: Diagnosis not present

## 2022-08-02 DIAGNOSIS — Z6827 Body mass index (BMI) 27.0-27.9, adult: Secondary | ICD-10-CM | POA: Diagnosis not present

## 2022-08-02 DIAGNOSIS — M5136 Other intervertebral disc degeneration, lumbar region: Secondary | ICD-10-CM | POA: Diagnosis not present

## 2022-08-02 DIAGNOSIS — E663 Overweight: Secondary | ICD-10-CM | POA: Diagnosis not present

## 2022-09-17 DIAGNOSIS — E663 Overweight: Secondary | ICD-10-CM | POA: Diagnosis not present

## 2022-09-17 DIAGNOSIS — M5136 Other intervertebral disc degeneration, lumbar region: Secondary | ICD-10-CM | POA: Diagnosis not present

## 2022-09-17 DIAGNOSIS — D509 Iron deficiency anemia, unspecified: Secondary | ICD-10-CM | POA: Diagnosis not present

## 2022-09-17 DIAGNOSIS — G43909 Migraine, unspecified, not intractable, without status migrainosus: Secondary | ICD-10-CM | POA: Diagnosis not present

## 2022-09-17 DIAGNOSIS — I10 Essential (primary) hypertension: Secondary | ICD-10-CM | POA: Diagnosis not present

## 2022-09-17 DIAGNOSIS — Z0001 Encounter for general adult medical examination with abnormal findings: Secondary | ICD-10-CM | POA: Diagnosis not present

## 2022-09-17 DIAGNOSIS — Z6827 Body mass index (BMI) 27.0-27.9, adult: Secondary | ICD-10-CM | POA: Diagnosis not present

## 2022-09-17 DIAGNOSIS — M5090 Cervical disc disorder, unspecified, unspecified cervical region: Secondary | ICD-10-CM | POA: Diagnosis not present

## 2022-09-17 DIAGNOSIS — J45909 Unspecified asthma, uncomplicated: Secondary | ICD-10-CM | POA: Diagnosis not present

## 2022-09-17 DIAGNOSIS — E782 Mixed hyperlipidemia: Secondary | ICD-10-CM | POA: Diagnosis not present

## 2022-10-09 IMAGING — DX DG CHEST 2V
2 series · 2 of 2 positions shown · non-contrast
Comparison: 12/30/2016

CLINICAL DATA: Dyspnea, unspecified type.

EXAM:
CHEST - 2 VIEW

[chest pa]
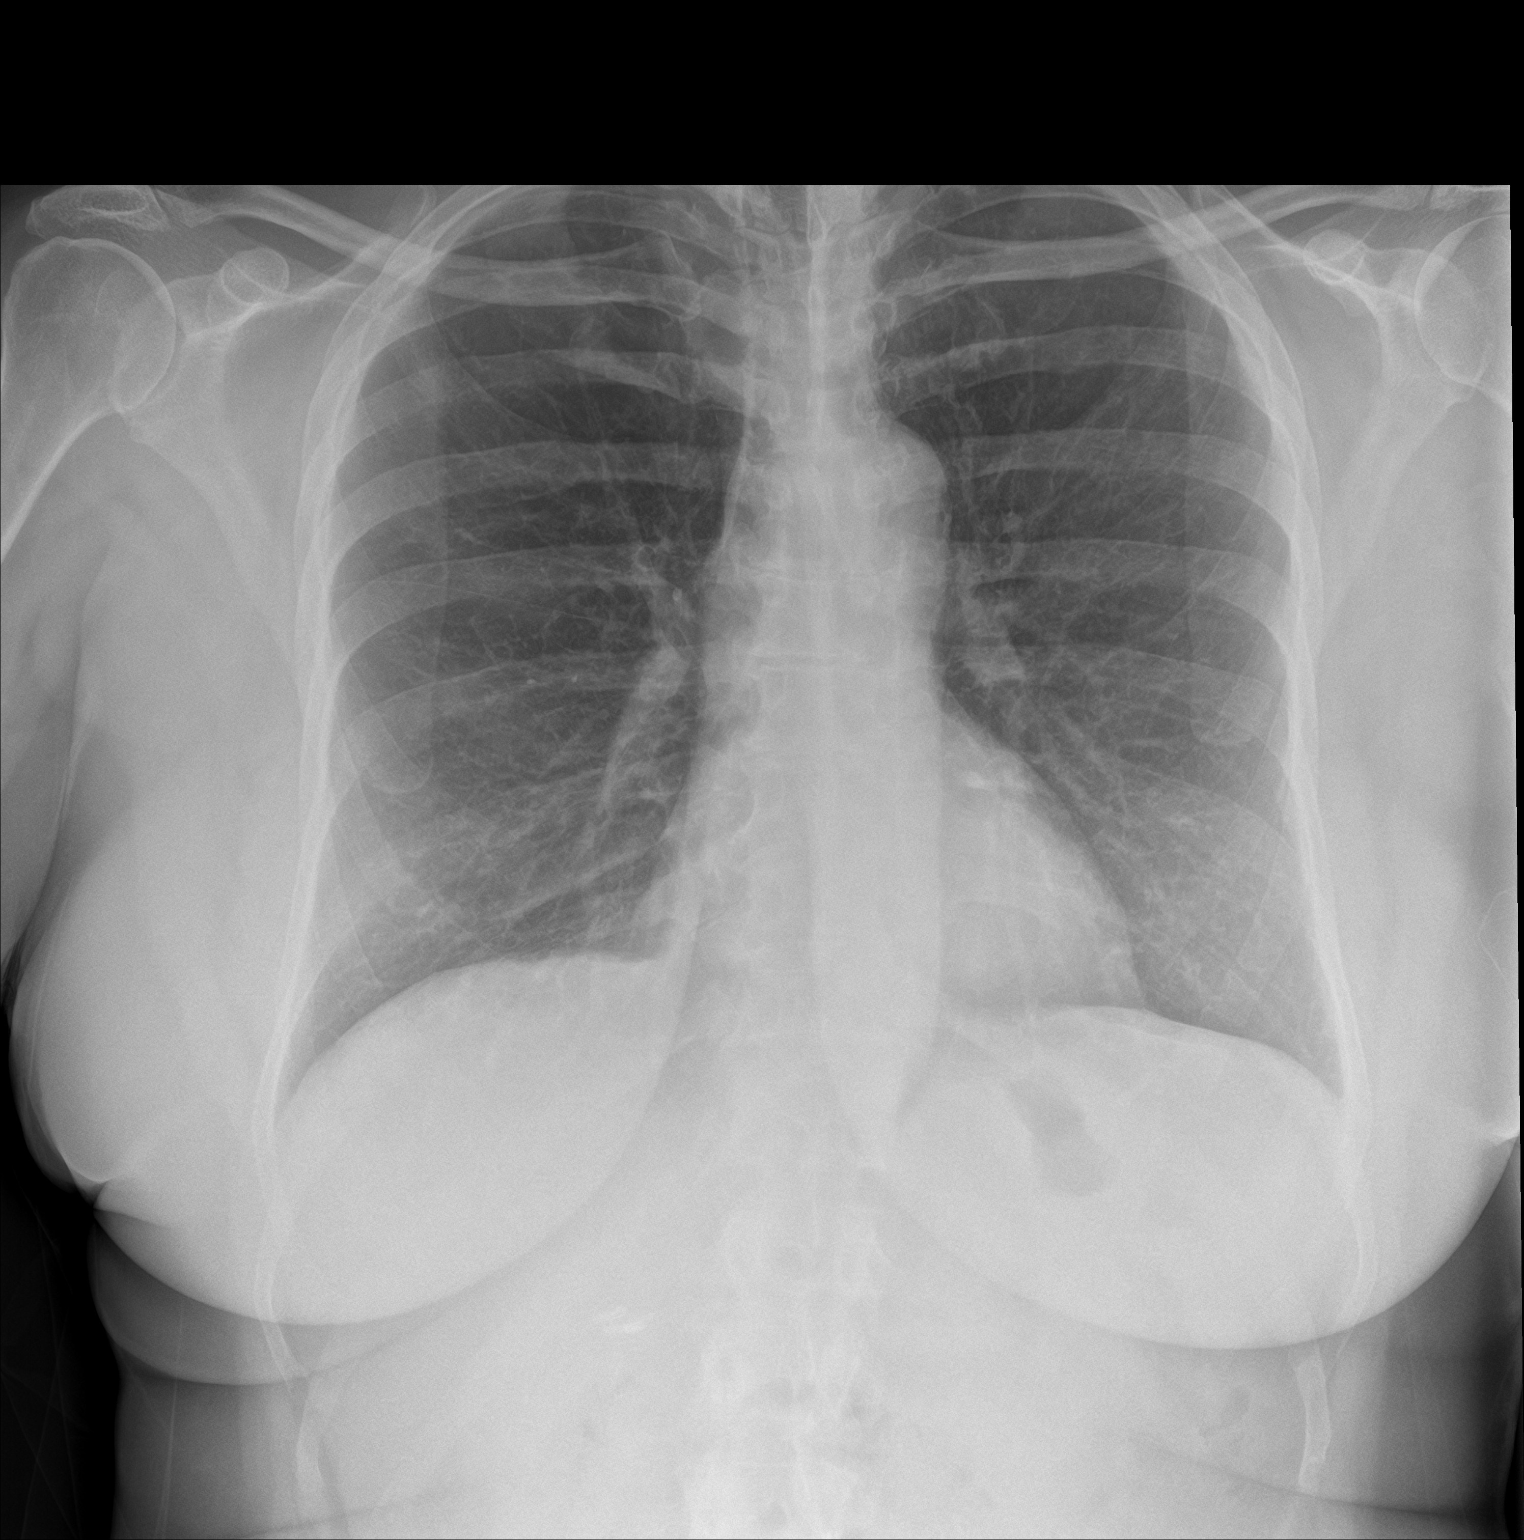

[chest lat]
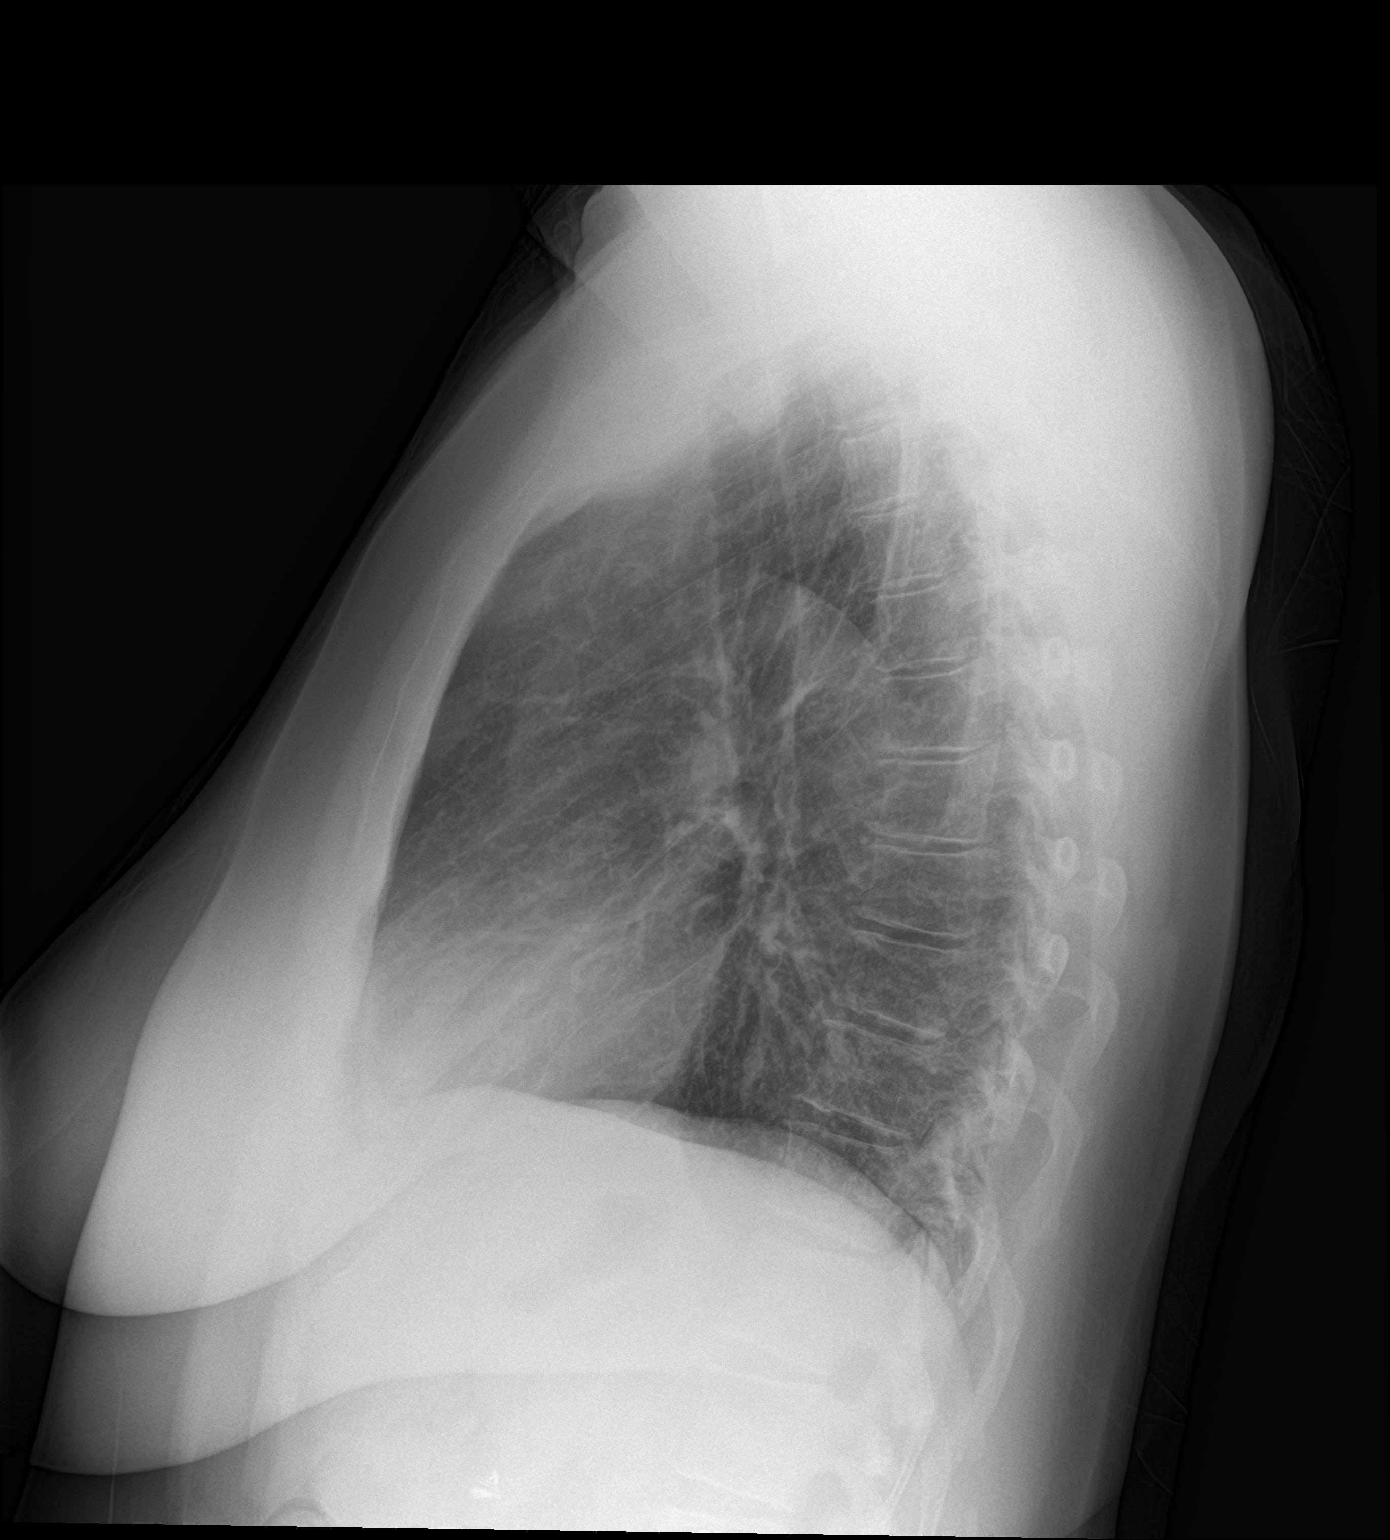

[2 of 2 positions shown; findings below may reference images not displayed]

FINDINGS: Surgical plate in the lower cervical spine. Both lungs are clear.
Heart and mediastinum are within normal limits. No pleural
effusions. Evidence for cholecystectomy clips. Trachea is midline.
IMPRESSION: No active cardiopulmonary disease.

## 2022-11-08 DIAGNOSIS — S91209A Unspecified open wound of unspecified toe(s) with damage to nail, initial encounter: Secondary | ICD-10-CM | POA: Diagnosis not present

## 2022-11-08 DIAGNOSIS — Z6827 Body mass index (BMI) 27.0-27.9, adult: Secondary | ICD-10-CM | POA: Diagnosis not present

## 2022-11-11 ENCOUNTER — Emergency Department (HOSPITAL_COMMUNITY): Payer: Medicare HMO

## 2022-11-11 ENCOUNTER — Encounter (HOSPITAL_COMMUNITY): Payer: Self-pay

## 2022-11-11 ENCOUNTER — Emergency Department (HOSPITAL_COMMUNITY)
Admission: EM | Admit: 2022-11-11 | Discharge: 2022-11-11 | Disposition: A | Discharge status: Home / Self Care | Payer: Medicare HMO | Attending: Emergency Medicine | Admitting: Emergency Medicine

## 2022-11-11 ENCOUNTER — Other Ambulatory Visit: Payer: Self-pay

## 2022-11-11 DIAGNOSIS — Z79899 Other long term (current) drug therapy: Secondary | ICD-10-CM | POA: Insufficient documentation

## 2022-11-11 DIAGNOSIS — R519 Headache, unspecified: Secondary | ICD-10-CM | POA: Insufficient documentation

## 2022-11-11 DIAGNOSIS — G238 Other specified degenerative diseases of basal ganglia: Secondary | ICD-10-CM | POA: Diagnosis not present

## 2022-11-11 DIAGNOSIS — I10 Essential (primary) hypertension: Secondary | ICD-10-CM | POA: Insufficient documentation

## 2022-11-11 MED ORDER — PROCHLORPERAZINE EDISYLATE 10 MG/2ML IJ SOLN
10.0000 mg | Freq: Once | INTRAMUSCULAR | Status: AC
  Administered 2022-11-11: 10 mg via INTRAVENOUS
  Filled 2022-11-11: qty 2

## 2022-11-11 MED ORDER — DIPHENHYDRAMINE HCL 50 MG/ML IJ SOLN
12.5000 mg | Freq: Once | INTRAMUSCULAR | Status: AC
  Administered 2022-11-11: 12.5 mg via INTRAVENOUS
  Filled 2022-11-11: qty 1

## 2022-11-11 MED ORDER — ACETAMINOPHEN 500 MG PO TABS
1000.0000 mg | ORAL_TABLET | Freq: Once | ORAL | Status: AC
Start: 2022-11-11 — End: 2022-11-11
  Administered 2022-11-11: 1000 mg via ORAL
  Filled 2022-11-11: qty 2

## 2022-11-11 NOTE — ED Triage Notes (Signed)
Pov from home cc of a sharp pain behind her left ear that shoots across her head, has been going on for 2 days and is getting worse. Wondering if its related to antibiotics- is on it for toenail removal.

## 2022-11-11 NOTE — Discharge Instructions (Signed)
You were seen today for headache.  You were treated for migraine.  Your CT imaging is reassuring.  Follow-up closely with your primary physician.

## 2022-11-11 NOTE — ED Provider Notes (Signed)
[place] EMERGENCY DEPARTMENT AT [facility] Provider Note   CSN: [id number] Arrival date & time: 11/11/22  7253     History  No chief complaint on file.   [name] is a 57 y.o. female.  HPI     This is a 57 year old female with history of migraines who presents with headache.  Patient reports she has had sharp pain behind the left ear over the last 2 days.  She has a history of migraines but states that this feels somewhat different.  She has not taken her migraine medication or any other medication at home.  She is currently on an antibiotic for toe infection but has not had any fevers.  No neck stiffness.  Denies nausea, vomiting, strokelike symptoms.  Family at bedside is concerned that it may be an aneurysm.  Reports family history of the same.  Home Medications Prior to Admission medications   Medication Sig Start Date End Date Taking? Authorizing Provider  baclofen (LIORESAL) 10 MG tablet Take 10 mg by mouth 1 day or 1 dose. 09/29/15   [provider]  cephALEXin (KEFLEX) 500 MG capsule TAKE 1 CAPSULE BY MOUTH 4 TIMES DAILY FOR 7 DAYS    [provider]  gabapentin (NEURONTIN) 100 MG capsule Take 1 capsule (100 mg total) by mouth 2 (two) times daily. 07/22/20   Kirsteins, Victorino Sparrow, MD  hydrochlorothiazide (HYDRODIURIL) 25 MG tablet  09/15/15   [provider]  meclizine (ANTIVERT) 50 MG tablet Take 0.5 tablets (25 mg total) by mouth 3 (three) times daily as needed for dizziness or nausea. 02/16/15   Linwood Dibbles, MD  rosuvastatin (CRESTOR) 10 MG tablet Take 10 mg by mouth at bedtime. 09/30/20   [provider]      Allergies    Amoxicillin-pot clavulanate, Ampicillin-sulbactam sodium, Ibuprofen, Moxifloxacin, Shellfish allergy, Shellfish-derived products, and Moxifloxacin hcl    Review of Systems   Review of Systems  Constitutional:  Negative for fever.  HENT:  Negative for ear pain.   Neurological:  Positive for headaches.  Negative for weakness and numbness.  All other systems reviewed and are negative.   Physical Exam Updated Vital Signs BP 121/77   Pulse 81   Temp 98.3 F (36.8 C)   Resp 18   Ht 1.702 m (5\' 7" )   Wt 79.8 kg   SpO2 95%   BMI 27.57 kg/m  Physical Exam Vitals and nursing note reviewed.  Constitutional:      Appearance: She is well-developed. She is not ill-appearing.  HENT:     Head: Normocephalic and atraumatic.     Right Ear: Tympanic membrane normal.     Left Ear: Tympanic membrane normal.     Ears:     Comments: Bilateral TMs clear.  No erythema.  No mastoid tenderness or redness Eyes:     Pupils: Pupils are equal, round, and reactive to light.  Cardiovascular:     Rate and Rhythm: Normal rate and regular rhythm.  Pulmonary:     Effort: Pulmonary effort is normal. No respiratory distress.  Abdominal:     Palpations: Abdomen is soft.  Musculoskeletal:     Cervical back: Normal range of motion.  Skin:    General: Skin is warm and dry.  Neurological:     Mental Status: She is alert and oriented to person, place, and time.     Comments: Cranial nerves II through XII intact, 5 out of 5 strength in all 4 extremities  Psychiatric:        Mood and Affect: Mood normal.     ED Results / Procedures / Treatments   Labs (all labs ordered are listed, but only abnormal results are displayed) Labs Reviewed - No data to display  EKG None  Radiology CT Head Wo Contrast  Result Date: 11/11/2022 CLINICAL DATA:  57 year old female with headache. Sharp pain behind the left ear radiating for 2 days. EXAM: CT HEAD WITHOUT CONTRAST TECHNIQUE: Contiguous axial images were obtained from the base of the skull through the vertex without intravenous contrast. RADIATION DOSE REDUCTION: This exam was performed according to the departmental dose-optimization program which includes automated exposure control, adjustment of the mA and/or kV according to patient size and/or use of iterative  reconstruction technique. COMPARISON:  Head CT 12/14/2012.  Cervical spine MRI 05/08/2021. FINDINGS: Brain: Normal cerebral volume. Faint chronic basal ganglia vascular calcifications. No midline shift, ventriculomegaly, mass effect, evidence of mass lesion, intracranial hemorrhage or evidence of cortically based acute infarction. Gray-white matter differentiation is within normal limits throughout the brain. Vascular: No suspicious intracranial vascular hyperdensity. Skull: Mild motion artifact at the skull base. Intact. No osseous abnormality identified. Sinuses/Orbits: Tympanic cavities, Visualized paranasal sinuses and mastoids are stable and well aerated. Other: Visualized orbits and scalp soft tissues are within normal limits. Periauricular soft tissues appear stable and unremarkable. IMPRESSION: Normal noncontrast Head CT. Electronically Signed   By: Odessa Fleming M.D.   On: 11/11/2022 04:32    Procedures Procedures    Medications Ordered in ED Medications  acetaminophen (TYLENOL) tablet 1,000 mg (1,000 mg Oral Given 11/11/22 0317)  prochlorperazine (COMPAZINE) injection 10 mg (10 mg Intravenous Given 11/11/22 0432)  diphenhydrAMINE (BENADRYL) injection 12.5 mg (12.5 mg Intravenous Given 11/11/22 0432)    ED Course/ Medical Decision Making/ A&P                                 Medical Decision Making Amount and/or Complexity of Data Reviewed Radiology: ordered.  Risk OTC drugs. Prescription drug management.   This patient presents to the ED for concern of headache, this involves an extensive number of treatment options, and is a complaint that carries with it a high risk of complications and morbidity.  I considered the following differential and admission for this acute, potentially life threatening condition.  The differential diagnosis includes migraine, tension headache, less likely mastoiditis, meningitis, or subarachnoid hemorrhage  MDM:    This is a 57 year old female with a history  of migraines who presents with headache.  She is nontoxic.  Vital signs are reassuring.  No red flags.  Family at bedside does bring up concern for possible aneurysm.  Neurologic exam is normal and description of headache is nonconcerning.  However, CT scan was obtained and does not show any evidence of obvious bleed.  Low suspicion.  Patient was initially given Tylenol followed by Compazine and Benadryl.  She had complete resolution of her headache.  May be an atypical migraine.  No signs or symptoms of infection.  (Labs, imaging, consults)  Labs: I Ordered, and personally interpreted labs.  The pertinent results include: None  Imaging Studies ordered: I ordered imaging studies including CT head I independently visualized and interpreted imaging. I agree with the radiologist interpretation  Additional history obtained from family at bedside.  External records from outside source obtained and reviewed including prior evaluations  Cardiac Monitoring: The patient was maintained on a cardiac  monitor.  If on the cardiac monitor, I personally viewed and interpreted the cardiac monitored which showed an underlying rhythm of: Sinus rhythm  Reevaluation: After the interventions noted above, I reevaluated the patient and found that they have :resolved  Social Determinants of Health: Lives independently  Disposition: Discharge  Co morbidities that complicate the patient evaluation  Past Medical History:  Diagnosis Date   Degenerative disk disease    GERD (gastroesophageal reflux disease)    Hypertension      Medicines Meds ordered this encounter  Medications   acetaminophen (TYLENOL) tablet 1,000 mg   prochlorperazine (COMPAZINE) injection 10 mg   diphenhydrAMINE (BENADRYL) injection 12.5 mg    I have reviewed the patients home medicines and have made adjustments as needed  Problem List / ED Course: Problem List Items Addressed This Visit   None Visit Diagnoses     Bad headache     -  Primary   Relevant Medications   acetaminophen (TYLENOL) tablet 1,000 mg (Completed)                   Final Clinical Impression(s) / ED Diagnoses Final diagnoses:  Bad headache    Rx / DC Orders ED Discharge Orders     None         Shon Baton, MD 11/11/22 [id number]

## 2022-11-15 DIAGNOSIS — G43909 Migraine, unspecified, not intractable, without status migrainosus: Secondary | ICD-10-CM | POA: Diagnosis not present

## 2022-11-15 DIAGNOSIS — M5090 Cervical disc disorder, unspecified, unspecified cervical region: Secondary | ICD-10-CM | POA: Diagnosis not present

## 2022-11-15 DIAGNOSIS — Z6827 Body mass index (BMI) 27.0-27.9, adult: Secondary | ICD-10-CM | POA: Diagnosis not present

## 2022-11-15 DIAGNOSIS — E663 Overweight: Secondary | ICD-10-CM | POA: Diagnosis not present

## 2022-12-31 DIAGNOSIS — E663 Overweight: Secondary | ICD-10-CM | POA: Diagnosis not present

## 2022-12-31 DIAGNOSIS — Z6827 Body mass index (BMI) 27.0-27.9, adult: Secondary | ICD-10-CM | POA: Diagnosis not present

## 2022-12-31 DIAGNOSIS — E782 Mixed hyperlipidemia: Secondary | ICD-10-CM | POA: Diagnosis not present

## 2022-12-31 DIAGNOSIS — M150 Primary generalized (osteo)arthritis: Secondary | ICD-10-CM | POA: Diagnosis not present

## 2022-12-31 DIAGNOSIS — M5134 Other intervertebral disc degeneration, thoracic region: Secondary | ICD-10-CM | POA: Diagnosis not present

## 2023-07-18 DIAGNOSIS — M5134 Other intervertebral disc degeneration, thoracic region: Secondary | ICD-10-CM | POA: Diagnosis not present

## 2023-07-18 DIAGNOSIS — E663 Overweight: Secondary | ICD-10-CM | POA: Diagnosis not present

## 2023-07-18 DIAGNOSIS — Z6827 Body mass index (BMI) 27.0-27.9, adult: Secondary | ICD-10-CM | POA: Diagnosis not present

## 2023-08-11 ENCOUNTER — Other Ambulatory Visit (HOSPITAL_COMMUNITY): Payer: Self-pay | Admitting: Internal Medicine

## 2023-08-11 ENCOUNTER — Ambulatory Visit (HOSPITAL_COMMUNITY)
Admission: RE | Admit: 2023-08-11 | Discharge: 2023-08-11 | Disposition: A | Discharge status: Home / Self Care | Source: Ambulatory Visit | Attending: Internal Medicine | Admitting: Internal Medicine

## 2023-08-11 DIAGNOSIS — J45909 Unspecified asthma, uncomplicated: Secondary | ICD-10-CM | POA: Diagnosis not present

## 2023-08-11 DIAGNOSIS — M67912 Unspecified disorder of synovium and tendon, left shoulder: Secondary | ICD-10-CM | POA: Diagnosis not present

## 2023-08-11 DIAGNOSIS — M549 Dorsalgia, unspecified: Secondary | ICD-10-CM

## 2023-08-11 DIAGNOSIS — M5414 Radiculopathy, thoracic region: Secondary | ICD-10-CM | POA: Diagnosis not present

## 2023-08-11 DIAGNOSIS — Z6827 Body mass index (BMI) 27.0-27.9, adult: Secondary | ICD-10-CM | POA: Diagnosis not present

## 2023-08-11 DIAGNOSIS — N390 Urinary tract infection, site not specified: Secondary | ICD-10-CM | POA: Diagnosis not present

## 2023-08-11 DIAGNOSIS — M5134 Other intervertebral disc degeneration, thoracic region: Secondary | ICD-10-CM | POA: Diagnosis not present

## 2023-08-11 DIAGNOSIS — M4322 Fusion of spine, cervical region: Secondary | ICD-10-CM | POA: Diagnosis not present

## 2023-08-11 DIAGNOSIS — M5090 Cervical disc disorder, unspecified, unspecified cervical region: Secondary | ICD-10-CM | POA: Diagnosis not present

## 2023-08-11 DIAGNOSIS — E663 Overweight: Secondary | ICD-10-CM | POA: Diagnosis not present

## 2023-08-11 DIAGNOSIS — I10 Essential (primary) hypertension: Secondary | ICD-10-CM | POA: Diagnosis not present

## 2023-08-29 ENCOUNTER — Other Ambulatory Visit (HOSPITAL_COMMUNITY): Payer: Self-pay | Admitting: Family Medicine

## 2023-08-29 DIAGNOSIS — Z1231 Encounter for screening mammogram for malignant neoplasm of breast: Secondary | ICD-10-CM

## 2023-09-02 ENCOUNTER — Ambulatory Visit (HOSPITAL_COMMUNITY)
Admission: RE | Admit: 2023-09-02 | Discharge: 2023-09-02 | Disposition: A | Discharge status: Home / Self Care | Source: Ambulatory Visit | Attending: Family Medicine | Admitting: Family Medicine

## 2023-09-02 DIAGNOSIS — Z1231 Encounter for screening mammogram for malignant neoplasm of breast: Secondary | ICD-10-CM | POA: Insufficient documentation

## 2023-09-13 DIAGNOSIS — Z6827 Body mass index (BMI) 27.0-27.9, adult: Secondary | ICD-10-CM | POA: Diagnosis not present

## 2023-09-13 DIAGNOSIS — M546 Pain in thoracic spine: Secondary | ICD-10-CM | POA: Diagnosis not present

## 2023-09-26 DIAGNOSIS — M546 Pain in thoracic spine: Secondary | ICD-10-CM | POA: Diagnosis not present

## 2023-09-26 DIAGNOSIS — M6281 Muscle weakness (generalized): Secondary | ICD-10-CM | POA: Diagnosis not present

## 2023-09-26 DIAGNOSIS — M2560 Stiffness of unspecified joint, not elsewhere classified: Secondary | ICD-10-CM | POA: Diagnosis not present

## 2023-09-29 DIAGNOSIS — M546 Pain in thoracic spine: Secondary | ICD-10-CM | POA: Diagnosis not present

## 2023-09-29 DIAGNOSIS — M47814 Spondylosis without myelopathy or radiculopathy, thoracic region: Secondary | ICD-10-CM | POA: Diagnosis not present

## 2023-09-29 DIAGNOSIS — M4804 Spinal stenosis, thoracic region: Secondary | ICD-10-CM | POA: Diagnosis not present

## 2023-09-29 DIAGNOSIS — M5134 Other intervertebral disc degeneration, thoracic region: Secondary | ICD-10-CM | POA: Diagnosis not present

## 2023-09-29 DIAGNOSIS — M4803 Spinal stenosis, cervicothoracic region: Secondary | ICD-10-CM | POA: Diagnosis not present

## 2023-10-04 DIAGNOSIS — M546 Pain in thoracic spine: Secondary | ICD-10-CM | POA: Diagnosis not present

## 2023-10-04 DIAGNOSIS — M2560 Stiffness of unspecified joint, not elsewhere classified: Secondary | ICD-10-CM | POA: Diagnosis not present

## 2023-10-04 DIAGNOSIS — M6281 Muscle weakness (generalized): Secondary | ICD-10-CM | POA: Diagnosis not present

## 2024-01-09 DIAGNOSIS — R222 Localized swelling, mass and lump, trunk: Secondary | ICD-10-CM

## 2024-01-10 ENCOUNTER — Ambulatory Visit: Admitting: Surgery

## 2024-01-10 VITALS — BP 123/80 | HR 79 | Temp 97.9°F | Resp 14 | Ht 67.0 in | Wt 174.0 lb

## 2024-01-10 DIAGNOSIS — R222 Localized swelling, mass and lump, trunk: Secondary | ICD-10-CM | POA: Diagnosis not present

## 2024-01-10 NOTE — Progress Notes (Signed)
 [facility] History and Physical  Reason for Referral: Right upper back mass Referring Physician: Dr. Marvine  Chief Complaint   New Patient (Initial Visit)     [name] is a 59 y.o. female.  HPI: Patient presents for evaluation of a right upper back mass.  She first noticed the area about 7 years ago.  She wanted to undergo surgery about 5 years ago to have it removed, but this coincided with COVID, and she was unable to undergo any surgeries.  She complains of right shoulder pain and right shoulder nerve pain.  She notes pain in this area with specific movements of her arm.  The area has been increasing in size, which has prompted her to want to seek removal of the area.  She denies the area ever being infected or having recent fevers and chills.  Her past medical history is significant for hypertension, hyperlipidemia, and diabetes.  Her surgical history is significant for a right arm cyst/lipoma excision, neck surgery, appendectomy, cholecystectomy, and partial hysterectomy.  She smokes half pack of cigarettes per day.  She denies use of alcohol and illicit drugs.  Past Medical History:  Diagnosis Date   Degenerative disk disease    GERD (gastroesophageal reflux disease)    Hypertension     Past Surgical History:  Procedure Laterality Date   ABDOMINAL SURGERY     APPENDECTOMY     CHOLECYSTECTOMY     COLONOSCOPY N/A 02/01/2014   2 SIMPLE ADNEOMA(HF), HYEPRPLASTIC RECTAL POLYPS   DILATION AND CURETTAGE OF UTERUS     NECK SURGERY     PARTIAL HYSTERECTOMY      Family History  Problem Relation Age of Onset   Hypotension Mother     Social History[1]  Medications: I have reviewed the patient's current medications. Allergies as of 01/10/2024       Reactions   Amoxicillin-pot Clavulanate Nausea And Vomiting, Hives   Ampicillin-sulbactam Sodium Hives, Swelling   Ibuprofen Hives, Swelling   Moxifloxacin Nausea Only   Shellfish Allergy Hives, Swelling    Shellfish Protein-containing Drug Products Hives, Itching   Moxifloxacin Hcl Hives        Medication List        Accurate as of January 10, 2024  9:39 AM. If you have any questions, ask your nurse or doctor.          STOP taking these medications    baclofen 10 MG tablet Commonly known as: LIORESAL   cephALEXin  500 MG capsule Commonly known as: KEFLEX    gabapentin  100 MG capsule Commonly known as: NEURONTIN    hydrochlorothiazide 25 MG tablet Commonly known as: HYDRODIURIL   meclizine  50 MG tablet Commonly known as: ANTIVERT        TAKE these medications    levocetirizine 5 MG tablet Commonly known as: XYZAL Take 5 mg by mouth every evening.   pregabalin 50 MG capsule Commonly known as: LYRICA Take 50 mg by mouth 2 (two) times daily.   rosuvastatin 10 MG tablet Commonly known as: CRESTOR Take 10 mg by mouth at bedtime.   Wellbutrin XL 150 MG 24 hr tablet Generic drug: buPROPion Take 150 mg by mouth daily.         ROS:  Constitutional: negative for chills, fatigue, and fevers Eyes: negative for visual disturbance and pain Ears, nose, mouth, throat, and face: negative for ear drainage, sore throat, and sinus problems Respiratory: negative for cough, wheezing, and shortness of breath Cardiovascular: negative for chest pain and palpitations  Gastrointestinal: negative for abdominal pain, nausea, reflux symptoms, and vomiting Genitourinary:negative for dysuria and frequency Integument/breast: positive for rash, negative for dryness Hematologic/lymphatic: negative for bleeding and lymphadenopathy Musculoskeletal:positive for back pain and neck pain Neurological: negative for dizziness and tremors Endocrine: negative for temperature intolerance  Blood pressure 123/80, pulse 79, temperature 97.9 F (36.6 C), temperature source Oral, resp. rate 14, height 5' 7 (1.702 m), weight 174 lb (78.9 kg), SpO2 93%. Physical Exam Vitals reviewed.  Constitutional:       Appearance: Normal appearance.  HENT:     Head: Normocephalic and atraumatic.  Eyes:     Extraocular Movements: Extraocular movements intact.     Pupils: Pupils are equal, round, and reactive to light.  Cardiovascular:     Rate and Rhythm: Normal rate and regular rhythm.  Pulmonary:     Effort: Pulmonary effort is normal.     Breath sounds: Normal breath sounds.  Abdominal:     General: There is no distension.     Palpations: Abdomen is soft.     Tenderness: There is no abdominal tenderness.  Musculoskeletal:        General: Normal range of motion.     Cervical back: Normal range of motion.  Skin:    General: Skin is warm and dry.     Comments: 4 to 5 cm palpable mass overlying the posterior right shoulder, soft and mobile, likely cyst  Neurological:     General: No focal deficit present.     Mental Status: She is alert and oriented to person, place, and time.  Psychiatric:        Mood and Affect: Mood normal.        Behavior: Behavior normal.     Results: No results found for this or any previous visit (from the past 48 hours).  No results found.   Assessment & Plan:  [name] is a 59 y.o. female who presents for evaluation of a right upper back mass.  -I explained that this patient's right upper back mass is likely a cyst, though it could also be a lipoma.  We discussed the pathophysiology of both epidermoid cysts and lipomas. -The risk and benefits of right shoulder/back mass excision were discussed including but not limited to bleeding, infection, injury to surrounding structures, need for additional procedures, and lipoma/cyst recurrence.  After careful consideration, [name] has decided to proceed with surgery.  -Patient tentatively scheduled for surgery on 1/16 -Information provided to the patient regarding epidermoid cysts and lipomas of  All questions were answered to the satisfaction of the patient.  Note: Portions of this report may have  been transcribed using voice recognition software. Every effort has been made to ensure accuracy; however, inadvertent computerized transcription errors may still be present.   Dorothyann Brittle, DO [facility] [address] [place], [place] [address] [phone] (office)         [1]  Social History Tobacco Use   Smoking status: Every Day    Current packs/day: 0.50    Types: Cigarettes   Smokeless tobacco: Never  Vaping Use   Vaping status: Never Used  Substance Use Topics   Alcohol use: No   Drug use: No

## 2024-01-12 NOTE — H&P (Signed)
 [facility] History and Physical  Reason for Referral: Right upper back mass Referring Physician: Dr. Marvine  Chief Complaint   New Patient (Initial Visit)     [name] is a 59 y.o. female.  HPI: Patient presents for evaluation of a right upper back mass.  She first noticed the area about 7 years ago.  She wanted to undergo surgery about 5 years ago to have it removed, but this coincided with COVID, and she was unable to undergo any surgeries.  She complains of right shoulder pain and right shoulder nerve pain.  She notes pain in this area with specific movements of her arm.  The area has been increasing in size, which has prompted her to want to seek removal of the area.  She denies the area ever being infected or having recent fevers and chills.  Her past medical history is significant for hypertension, hyperlipidemia, and diabetes.  Her surgical history is significant for a right arm cyst/lipoma excision, neck surgery, appendectomy, cholecystectomy, and partial hysterectomy.  She smokes half pack of cigarettes per day.  She denies use of alcohol and illicit drugs.  Past Medical History:  Diagnosis Date   Degenerative disk disease    GERD (gastroesophageal reflux disease)    Hypertension     Past Surgical History:  Procedure Laterality Date   ABDOMINAL SURGERY     APPENDECTOMY     CHOLECYSTECTOMY     COLONOSCOPY N/A 02/01/2014   2 SIMPLE ADNEOMA(HF), HYEPRPLASTIC RECTAL POLYPS   DILATION AND CURETTAGE OF UTERUS     NECK SURGERY     PARTIAL HYSTERECTOMY      Family History  Problem Relation Age of Onset   Hypotension Mother     [Social History]  [Social History] Tobacco Use   Smoking status: Every Day    Current packs/day: 0.50    Types: Cigarettes   Smokeless tobacco: Never  Vaping Use   Vaping status: Never Used  Substance Use Topics   Alcohol use: No   Drug use: No    Medications: I have reviewed the patient's current  medications. Allergies as of 01/10/2024       Reactions   Amoxicillin-pot Clavulanate Nausea And Vomiting, Hives   Ampicillin-sulbactam Sodium Hives, Swelling   Ibuprofen Hives, Swelling   Moxifloxacin Nausea Only   Shellfish Allergy Hives, Swelling   Shellfish Protein-containing Drug Products Hives, Itching   Moxifloxacin Hcl Hives        Medication List        Accurate as of January 10, 2024  9:39 AM. If you have any questions, ask your nurse or doctor.          STOP taking these medications    baclofen 10 MG tablet Commonly known as: LIORESAL   cephALEXin  500 MG capsule Commonly known as: KEFLEX    gabapentin  100 MG capsule Commonly known as: NEURONTIN    hydrochlorothiazide 25 MG tablet Commonly known as: HYDRODIURIL   meclizine  50 MG tablet Commonly known as: ANTIVERT        TAKE these medications    levocetirizine 5 MG tablet Commonly known as: XYZAL Take 5 mg by mouth every evening.   pregabalin 50 MG capsule Commonly known as: LYRICA Take 50 mg by mouth 2 (two) times daily.   rosuvastatin 10 MG tablet Commonly known as: CRESTOR Take 10 mg by mouth at bedtime.   Wellbutrin XL 150 MG 24 hr tablet Generic drug: buPROPion Take 150 mg by mouth daily.  ROS:  Constitutional: negative for chills, fatigue, and fevers Eyes: negative for visual disturbance and pain Ears, nose, mouth, throat, and face: negative for ear drainage, sore throat, and sinus problems Respiratory: negative for cough, wheezing, and shortness of breath Cardiovascular: negative for chest pain and palpitations Gastrointestinal: negative for abdominal pain, nausea, reflux symptoms, and vomiting Genitourinary:negative for dysuria and frequency Integument/breast: positive for rash, negative for dryness Hematologic/lymphatic: negative for bleeding and lymphadenopathy Musculoskeletal:positive for back pain and neck pain Neurological: negative for dizziness and  tremors Endocrine: negative for temperature intolerance  Blood pressure 123/80, pulse 79, temperature 97.9 F (36.6 C), temperature source Oral, resp. rate 14, height 5' 7 (1.702 m), weight 174 lb (78.9 kg), SpO2 93%. Physical Exam Vitals reviewed.  Constitutional:      Appearance: Normal appearance.  HENT:     Head: Normocephalic and atraumatic.  Eyes:     Extraocular Movements: Extraocular movements intact.     Pupils: Pupils are equal, round, and reactive to light.  Cardiovascular:     Rate and Rhythm: Normal rate and regular rhythm.  Pulmonary:     Effort: Pulmonary effort is normal.     Breath sounds: Normal breath sounds.  Abdominal:     General: There is no distension.     Palpations: Abdomen is soft.     Tenderness: There is no abdominal tenderness.  Musculoskeletal:        General: Normal range of motion.     Cervical back: Normal range of motion.  Skin:    General: Skin is warm and dry.     Comments: 4 to 5 cm palpable mass overlying the posterior right shoulder, soft and mobile, likely cyst  Neurological:     General: No focal deficit present.     Mental Status: She is alert and oriented to person, place, and time.  Psychiatric:        Mood and Affect: Mood normal.        Behavior: Behavior normal.     Results: No results found for this or any previous visit (from the past 48 hours).  No results found.   Assessment & Plan:  [name] is a 59 y.o. female who presents for evaluation of a right upper back mass.  -I explained that this patient's right upper back mass is likely a cyst, though it could also be a lipoma.  We discussed the pathophysiology of both epidermoid cysts and lipomas. -The risk and benefits of right shoulder/back mass excision were discussed including but not limited to bleeding, infection, injury to surrounding structures, need for additional procedures, and lipoma/cyst recurrence.  After careful consideration, [name] has  decided to proceed with surgery.  -Patient tentatively scheduled for surgery on 1/16 -Information provided to the patient regarding epidermoid cysts and lipomas of  All questions were answered to the satisfaction of the patient.  Note: Portions of this report may have been transcribed using voice recognition software. Every effort has been made to ensure accuracy; however, inadvertent computerized transcription errors may still be present.   Dorothyann Brittle, DO [facility] [address] [place], [place] [address] [phone] (office)

## 2024-01-18 ENCOUNTER — Encounter (HOSPITAL_COMMUNITY)
Admission: RE | Admit: 2024-01-18 | Discharge: 2024-01-18 | Disposition: A | Discharge status: Home / Self Care | Source: Ambulatory Visit | Attending: Surgery | Admitting: Surgery

## 2024-01-18 ENCOUNTER — Encounter (HOSPITAL_COMMUNITY): Payer: Self-pay

## 2024-01-18 ENCOUNTER — Other Ambulatory Visit: Payer: Self-pay

## 2024-01-20 ENCOUNTER — Encounter (HOSPITAL_COMMUNITY): Payer: Self-pay | Admitting: Certified Registered"

## 2024-01-20 ENCOUNTER — Ambulatory Visit (HOSPITAL_COMMUNITY): Payer: Self-pay | Admitting: Certified Registered"

## 2024-01-20 ENCOUNTER — Ambulatory Visit (HOSPITAL_COMMUNITY)
Admission: RE | Admit: 2024-01-20 | Discharge: 2024-01-20 | Disposition: A | Discharge status: Home / Self Care | Attending: Surgery | Admitting: Surgery

## 2024-01-20 ENCOUNTER — Encounter (HOSPITAL_COMMUNITY): Admission: RE | Disposition: A | Payer: Self-pay | Source: Home / Self Care | Attending: Surgery

## 2024-01-20 ENCOUNTER — Encounter (HOSPITAL_COMMUNITY): Payer: Self-pay | Admitting: Surgery

## 2024-01-20 DIAGNOSIS — K219 Gastro-esophageal reflux disease without esophagitis: Secondary | ICD-10-CM | POA: Diagnosis not present

## 2024-01-20 DIAGNOSIS — I10 Essential (primary) hypertension: Secondary | ICD-10-CM | POA: Diagnosis not present

## 2024-01-20 DIAGNOSIS — E119 Type 2 diabetes mellitus without complications: Secondary | ICD-10-CM | POA: Diagnosis not present

## 2024-01-20 DIAGNOSIS — M792 Neuralgia and neuritis, unspecified: Secondary | ICD-10-CM | POA: Insufficient documentation

## 2024-01-20 DIAGNOSIS — L720 Epidermal cyst: Secondary | ICD-10-CM | POA: Insufficient documentation

## 2024-01-20 DIAGNOSIS — E785 Hyperlipidemia, unspecified: Secondary | ICD-10-CM | POA: Insufficient documentation

## 2024-01-20 DIAGNOSIS — Z90711 Acquired absence of uterus with remaining cervical stump: Secondary | ICD-10-CM | POA: Diagnosis not present

## 2024-01-20 DIAGNOSIS — R222 Localized swelling, mass and lump, trunk: Secondary | ICD-10-CM | POA: Diagnosis present

## 2024-01-20 DIAGNOSIS — F17210 Nicotine dependence, cigarettes, uncomplicated: Secondary | ICD-10-CM | POA: Diagnosis not present

## 2024-01-20 DIAGNOSIS — Z9049 Acquired absence of other specified parts of digestive tract: Secondary | ICD-10-CM | POA: Insufficient documentation

## 2024-01-20 HISTORY — PX: EXCISION MASS ABDOMINAL: SHX6701

## 2024-01-20 LAB — GLUCOSE, CAPILLARY: Glucose-Capillary: 90 mg/dL (ref 70–99)

## 2024-01-20 MED ORDER — LACTATED RINGERS IV SOLN: INTRAVENOUS | Status: DC

## 2024-01-20 MED ORDER — ACETAMINOPHEN 500 MG PO TABS: 1000.0000 mg | ORAL_TABLET | Freq: Four times a day (QID) | ORAL | 0 refills | Status: AC

## 2024-01-20 MED ORDER — DEXAMETHASONE SOD PHOSPHATE PF 10 MG/ML IJ SOLN
INTRAMUSCULAR | Status: AC
  Filled 2024-01-20: qty 1

## 2024-01-20 MED ORDER — DEXMEDETOMIDINE HCL IN NACL 80 MCG/20ML IV SOLN
INTRAVENOUS | Status: DC | PRN
  Administered 2024-01-20: 12 ug via INTRAVENOUS
  Administered 2024-01-20: 8 ug via INTRAVENOUS

## 2024-01-20 MED ORDER — CHLORHEXIDINE GLUCONATE CLOTH 2 % EX PADS: 6.0000 | MEDICATED_PAD | Freq: Once | CUTANEOUS | Status: DC

## 2024-01-20 MED ORDER — ONDANSETRON HCL 4 MG/2ML IJ SOLN: 4.0000 mg | Freq: Once | INTRAMUSCULAR | Status: DC | PRN

## 2024-01-20 MED ORDER — ONDANSETRON HCL 4 MG/2ML IJ SOLN
INTRAMUSCULAR | Status: AC
  Filled 2024-01-20: qty 2

## 2024-01-20 MED ORDER — PROPOFOL 10 MG/ML IV BOLUS
INTRAVENOUS | Status: DC | PRN
  Administered 2024-01-20: 200 mg via INTRAVENOUS

## 2024-01-20 MED ORDER — ONDANSETRON HCL 4 MG PO TABS: 4.0000 mg | ORAL_TABLET | Freq: Three times a day (TID) | ORAL | 0 refills | Status: DC | PRN

## 2024-01-20 MED ORDER — PROPOFOL 10 MG/ML IV BOLUS
INTRAVENOUS | Status: AC
  Filled 2024-01-20: qty 20

## 2024-01-20 MED ORDER — ROCURONIUM BROMIDE 10 MG/ML (PF) SYRINGE
PREFILLED_SYRINGE | INTRAVENOUS | Status: DC | PRN
  Administered 2024-01-20: 60 mg via INTRAVENOUS

## 2024-01-20 MED ORDER — DEXAMETHASONE SOD PHOSPHATE PF 10 MG/ML IJ SOLN
INTRAMUSCULAR | Status: DC | PRN
  Administered 2024-01-20: 10 mg via INTRAVENOUS

## 2024-01-20 MED ORDER — DOCUSATE SODIUM 100 MG PO CAPS: 100.0000 mg | ORAL_CAPSULE | Freq: Two times a day (BID) | ORAL | 2 refills | Status: AC

## 2024-01-20 MED ORDER — FENTANYL CITRATE (PF) 100 MCG/2ML IJ SOLN
INTRAMUSCULAR | Status: AC
  Filled 2024-01-20: qty 2

## 2024-01-20 MED ORDER — SUGAMMADEX SODIUM 200 MG/2ML IV SOLN
INTRAVENOUS | Status: DC | PRN
  Administered 2024-01-20: 200 mg via INTRAVENOUS

## 2024-01-20 MED ORDER — BUPIVACAINE HCL (PF) 0.5 % IJ SOLN
INTRAMUSCULAR | Status: AC
  Filled 2024-01-20: qty 30

## 2024-01-20 MED ORDER — BUPIVACAINE HCL (PF) 0.5 % IJ SOLN
INTRAMUSCULAR | Status: DC | PRN
  Administered 2024-01-20: 20 mL

## 2024-01-20 MED ORDER — MIDAZOLAM HCL (PF) 2 MG/2ML IJ SOLN
INTRAMUSCULAR | Status: DC | PRN
  Administered 2024-01-20: 2 mg via INTRAVENOUS

## 2024-01-20 MED ORDER — ONDANSETRON HCL 4 MG/2ML IJ SOLN
INTRAMUSCULAR | Status: DC | PRN
  Administered 2024-01-20: 4 mg via INTRAVENOUS

## 2024-01-20 MED ORDER — LIDOCAINE HCL (CARDIAC) PF 100 MG/5ML IV SOSY
PREFILLED_SYRINGE | INTRAVENOUS | Status: DC | PRN
  Administered 2024-01-20: 100 mg via INTRATRACHEAL

## 2024-01-20 MED ORDER — CHLORHEXIDINE GLUCONATE 0.12 % MT SOLN: 15.0000 mL | Freq: Once | OROMUCOSAL | Status: DC

## 2024-01-20 MED ORDER — FENTANYL CITRATE (PF) 100 MCG/2ML IJ SOLN
INTRAMUSCULAR | Status: DC | PRN
  Administered 2024-01-20 (×2): 50 ug via INTRAVENOUS

## 2024-01-20 MED ORDER — OXYCODONE HCL 5 MG PO TABS: 5.0000 mg | ORAL_TABLET | Freq: Once | ORAL | Status: DC | PRN

## 2024-01-20 MED ORDER — ORAL CARE MOUTH RINSE: 15.0000 mL | Freq: Once | OROMUCOSAL | Status: DC

## 2024-01-20 MED ORDER — LIDOCAINE 2% (20 MG/ML) 5 ML SYRINGE
INTRAMUSCULAR | Status: AC
  Filled 2024-01-20: qty 5

## 2024-01-20 MED ORDER — OXYCODONE HCL 5 MG PO TABS: 5.0000 mg | ORAL_TABLET | Freq: Four times a day (QID) | ORAL | 0 refills | Status: DC | PRN

## 2024-01-20 MED ORDER — OXYCODONE HCL 5 MG/5ML PO SOLN: 5.0000 mg | Freq: Once | ORAL | Status: DC | PRN

## 2024-01-20 MED ORDER — ROCURONIUM BROMIDE 10 MG/ML (PF) SYRINGE
PREFILLED_SYRINGE | INTRAVENOUS | Status: AC
  Filled 2024-01-20: qty 10

## 2024-01-20 MED ORDER — FENTANYL CITRATE (PF) 50 MCG/ML IJ SOSY: 25.0000 ug | PREFILLED_SYRINGE | INTRAMUSCULAR | Status: DC | PRN

## 2024-01-20 MED ORDER — MIDAZOLAM HCL 2 MG/2ML IJ SOLN
INTRAMUSCULAR | Status: AC
  Filled 2024-01-20: qty 2

## 2024-01-20 NOTE — Anesthesia Procedure Notes (Signed)
 Procedure Name: Intubation Date/Time: 01/20/2024 10:18 AM  Performed by: Pheobe Adine CROME, CRNAPre-anesthesia Checklist: Patient identified, Emergency Drugs available, Suction available, Patient being monitored and Timeout performed Patient Re-evaluated:Patient Re-evaluated prior to induction Oxygen Delivery Method: Circle system utilized Preoxygenation: Pre-oxygenation with 100% oxygen Induction Type: IV induction Ventilation: Mask ventilation without difficulty Laryngoscope Size: Miller and 2 Grade View: Grade III Tube type: Oral Tube size: 7.0 mm Number of attempts: 1 Airway Equipment and Method: Stylet Placement Confirmation: ETT inserted through vocal cords under direct vision, positive ETCO2, CO2 detector and breath sounds checked- equal and bilateral Secured at: 21 cm Tube secured with: Tape Dental Injury: Teeth and Oropharynx as per pre-operative assessment

## 2024-01-20 NOTE — Discharge Instructions (Signed)
 Ambulatory Surgery Discharge Instructions  General Anesthesia or Sedation Do not drive or operate heavy machinery for 24 hours.  Do not consume alcohol, tranquilizers, sleeping medications, or any non-prescribed medications for 24 hours. Do not make important decisions or sign any important papers in the next 24 hours. You should have someone with you tonight at home.  Activity  You are advised to go directly home from the hospital.  Restrict your activities and rest for a day.  Resume light activity tomorrow. No heavy lifting over 10 lbs or strenuous exercise.  Fluids and Diet Begin with clear liquids, bouillon, dry toast, soda crackers.  If not nauseated, you may go to a regular diet when you desire.  Greasy and spicy foods are not advised.  Medications  If you have not had a bowel movement in 24 hours, take 2 tablespoons over the counter Milk of mag.             You May resume your blood thinners tomorrow (Aspirin, coumadin, or other).  You are being discharged with prescriptions for Opioid/Narcotic Medications: There are some specific considerations for these medications that you should know. Opioid Meds have risks & benefits. Addiction to these meds is always a concern with prolonged use Take medication only as directed Do not drive while taking narcotic pain medication Do not crush tablets or capsules Do not use a different container than medication was dispensed in Lock the container of medication in a cool, dry place out of reach of children and pets. Opioid medication can cause addiction Do not share with anyone else (this is a felony) Do not store medications for future use. Dispose of them properly.     Disposal:  Find a [place]  household drug take back site near you.  If you can't get to a drug take back site, use the recipe below as a last resort to dispose of expired, unused or unwanted drugs. Disposal  (Do not dispose chemotherapy drugs this way, talk to your  prescribing doctor instead.) Step 1: Mix drugs (do not crush) with dirt, kitty litter, or used coffee grounds and add a small amount of water  to dissolve any solid medications. Step 2: Seal drugs in plastic bag. Step 3: Place plastic bag in trash. Step 4: Take prescription container and scratch out personal information, then recycle or throw away.  Operative Site  Ok to take outer dressing off tomorrow. You have external stitches in place that will come out at your follow up visit. Ok to english as a second language teacher. Keep wound clean and dry. No baths or swimming. No lifting more than 10 pounds.  May want to keep the area covered to prevent stitches from sticking to clothing.  Contact Information: If you have questions or concerns, please call our office, [phone], Monday- Thursday 8AM-5PM and Friday 8AM-12Noon.  If it is after hours or on the weekend, please call [facility] Main Number, [phone], and ask to speak to the surgeon on call for Dr. Evonnie at [facility].   SPECIFIC COMPLICATIONS TO WATCH FOR: Inability to urinate Fever over 101? F by mouth Nausea and vomiting lasting longer than 24 hours. Pain not relieved by medication ordered Swelling around the operative site Increased redness, warmth, hardness, around operative area Numbness, tingling, or cold fingers or toes Blood -soaked dressing, (small amounts of oozing may be normal) Increasing and progressive drainage from surgical area or exam site

## 2024-01-20 NOTE — Progress Notes (Signed)
 [facility]  Spoke with the patient's fiance in the consultation room.  I explained that she tolerated the procedure without difficulty.  She has external stitches in place that will be removed at her follow up visit in 2 weeks.  I discharged her home with a prescription for narcotic pain medication that they should take as needed for pain.  I also want her taking scheduled Tylenol .  If they take the narcotic pain medication, they should take a stool softener as well.  The patient will follow-up with me in 2 weeks.  All questions were answered to his expressed satisfaction.  Dorothyann Brittle, DO [facility] [address] [place], [place] [address] [phone] (office)

## 2024-01-20 NOTE — Op Note (Addendum)
 [facility] Operative Note  01/20/24  Preoperative Diagnosis: Right upper back cyst   Postoperative Diagnosis: Same   Procedure(s) Performed: Excision of right upper back cyst, 4 cm   Surgeon: Dorothyann Brittle, DO    Assistants: No qualified resident was available    Anesthesia: General anesthesia   Anesthesiologist: Kendell Yvonna PARAS, MD    Specimens: Right upper back cyst   Estimated Blood Loss: Minimal   Blood Replacement: None    Complications: None   Wound Class: Clean contaminated   Operative Indications: Patient is a 59 year old female who presents for excision of right upper back cyst. The area has been getting bigger and it causes her some pain with certain movements of her arm.  She is agreeable to surgery at this time.  All risks and benefits of performing this procedure were discussed with the patient including pain, infection, bleeding, damage to the surrounding structures, and need for more procedures or surgery. The patient voiced understanding of the procedure, all questions were sought and answered, and consent was obtained.  Findings: 4 cm right upper back cyst, completely removed   Procedure: The patient was taken to the operating room and placed supine. General endotracheal anesthesia was induced. Intravenous antibiotics were not administered per protocol.  The patient was then placed in a left lateral decubitus position.  The upper back and neck were prepared and draped in the usual sterile fashion.  A time-out was completed verifying correct patient, procedure, site, positioning, and implant(s) and/or special equipment prior to beginning this procedure.  An elliptical incision was made overlying the cyst. Using electrocautery and blunt dissection, the dermis and subcutaneous tissues were dissected around the cyst. The cyst was removed in its entirety and sent to pathology for evaluation.  Hemostasis was achieved using electrocautery.   The incision site was localized with marcaine .  The dermis was approximated using 3-0 Vicryl sutures. The skin was closed using 3-0 Nylon in a vertical mattress fashion. A dressing with xeroform, 4x4s, and medipore tape was applied. The patient tolerated the procedure well.  Final inspection revealed acceptable hemostasis. All counts were correct at the end of the case. The patient was awakened from anesthesia and extubated without complication.  The patient went to the PACU in stable condition.   Dorothyann Brittle, DO  [facility] [address] [place], [place] [address] [phone] (office)

## 2024-01-20 NOTE — Anesthesia Postprocedure Evaluation (Signed)
"   Anesthesia Post Note  Patient: [name]  Procedure(s) Performed: EXCISION, MASS, TORSO (Right: Shoulder)  Patient location during evaluation: Phase II Anesthesia Type: General Level of consciousness: awake Pain management: pain level controlled Vital Signs Assessment: post-procedure vital signs reviewed and stable Respiratory status: spontaneous breathing and respiratory function stable Cardiovascular status: blood pressure returned to baseline and stable Postop Assessment: no headache and no apparent nausea or vomiting Anesthetic complications: no Comments: Late entry   No notable events documented.   Last Vitals:  Vitals:   01/20/24 1130 01/20/24 1233  BP:  112/80  Pulse:  68  Resp:  18  Temp: 37.1 C 36.5 C  SpO2:  96%    Last Pain:  Vitals:   01/20/24 1233  TempSrc: Oral  PainSc: 0-No pain                 Yvonna PARAS Yehudit Fulginiti      "

## 2024-01-20 NOTE — Interval H&P Note (Signed)
 History and Physical Interval Note:  01/20/2024 9:45 AM  [name]  has presented today for surgery, with the diagnosis of MASS, UPPER BACK, RIGHT GREATER THAN 4 CM.  The various methods of treatment have been discussed with the patient and family. After consideration of risks, benefits and other options for treatment, the patient has consented to  Procedures with comments: EXCISION, MASS, TORSO (Right) - UPPER BACK, RIGHT SIDE as a surgical intervention.  The patient's history has been reviewed, patient examined, no change in status, stable for surgery.  I have reviewed the patient's chart and labs.  Questions were answered to the patient's satisfaction.     [name] A Lovell Roe

## 2024-01-20 NOTE — Anesthesia Preprocedure Evaluation (Signed)
"                                    Anesthesia Evaluation  Patient identified by MRN, date of birth, ID band Patient awake    Reviewed: Allergy & Precautions, H&P , NPO status , Patient's Chart, lab work & pertinent test results, reviewed documented beta blocker date and time   Airway Mallampati: II  TM Distance: >3 FB Neck ROM: full    Dental no notable dental hx.    Pulmonary neg pulmonary ROS, Current Smoker   Pulmonary exam normal breath sounds clear to auscultation       Cardiovascular Exercise Tolerance: Good hypertension,  Rhythm:regular Rate:Normal     Neuro/Psych negative neurological ROS  negative psych ROS   GI/Hepatic Neg liver ROS,GERD  ,,  Endo/Other  negative endocrine ROS    Renal/GU negative Renal ROS  negative genitourinary   Musculoskeletal   Abdominal   Peds  Hematology negative hematology ROS (+)   Anesthesia Other Findings   Reproductive/Obstetrics negative OB ROS                              Anesthesia Physical Anesthesia Plan  ASA: 2  Anesthesia Plan: General and General LMA   Post-op Pain Management:    Induction:   PONV Risk Score and Plan: Ondansetron   Airway Management Planned:   Additional Equipment:   Intra-op Plan:   Post-operative Plan:   Informed Consent: I have reviewed the patients History and Physical, chart, labs and discussed the procedure including the risks, benefits and alternatives for the proposed anesthesia with the patient or authorized representative who has indicated his/her understanding and acceptance.     Dental Advisory Given  Plan Discussed with: CRNA  Anesthesia Plan Comments:         Anesthesia Quick Evaluation  "

## 2024-01-20 NOTE — Transfer of Care (Signed)
 Immediate Anesthesia Transfer of Care Note  Patient: [name]  Procedure(s) Performed: EXCISION, MASS, TORSO (Right: Shoulder)  Patient Location: PACU  Anesthesia Type:General  Level of Consciousness: drowsy, patient cooperative, and responds to stimulation  Airway & Oxygen Therapy: Patient Spontanous Breathing and Patient connected to face mask oxygen  Post-op Assessment: Report given to RN and Post -op Vital signs reviewed and stable  Post vital signs: Reviewed and stable  Last Vitals:  Vitals Value Taken Time  BP 112/72 01/20/24 11:30  Temp 98.6   Pulse 81 01/20/24 11:31  Resp 22 01/20/24 11:31  SpO2 100 % 01/20/24 11:31  Vitals shown include unfiled device data.  Last Pain:  Vitals:   01/20/24 0842  TempSrc: Oral  PainSc: 0-No pain      Patients Stated Pain Goal: 6 (01/20/24 9157)  Complications: No notable events documented.

## 2024-01-23 LAB — SURGICAL PATHOLOGY

## 2024-01-31 ENCOUNTER — Encounter: Admitting: Surgery

## 2024-02-01 ENCOUNTER — Encounter: Payer: Self-pay | Admitting: Surgery

## 2024-02-01 ENCOUNTER — Ambulatory Visit: Admitting: Surgery

## 2024-02-01 ENCOUNTER — Other Ambulatory Visit: Payer: Self-pay

## 2024-02-01 VITALS — BP 114/78 | HR 97 | Temp 98.5°F | Resp 16 | Ht 67.0 in | Wt 177.0 lb

## 2024-02-01 DIAGNOSIS — J45909 Unspecified asthma, uncomplicated: Secondary | ICD-10-CM | POA: Insufficient documentation

## 2024-02-01 DIAGNOSIS — Z09 Encounter for follow-up examination after completed treatment for conditions other than malignant neoplasm: Secondary | ICD-10-CM

## 2024-02-01 DIAGNOSIS — M150 Primary generalized (osteo)arthritis: Secondary | ICD-10-CM | POA: Insufficient documentation

## 2024-02-01 DIAGNOSIS — E782 Mixed hyperlipidemia: Secondary | ICD-10-CM | POA: Insufficient documentation

## 2024-02-01 DIAGNOSIS — I10 Essential (primary) hypertension: Secondary | ICD-10-CM | POA: Insufficient documentation

## 2024-02-01 DIAGNOSIS — M5134 Other intervertebral disc degeneration, thoracic region: Secondary | ICD-10-CM | POA: Insufficient documentation

## 2024-02-01 DIAGNOSIS — M5090 Cervical disc disorder, unspecified, unspecified cervical region: Secondary | ICD-10-CM | POA: Insufficient documentation

## 2024-02-01 DIAGNOSIS — R7301 Impaired fasting glucose: Secondary | ICD-10-CM | POA: Insufficient documentation

## 2024-02-01 DIAGNOSIS — M5414 Radiculopathy, thoracic region: Secondary | ICD-10-CM | POA: Insufficient documentation

## 2024-02-01 DIAGNOSIS — D509 Iron deficiency anemia, unspecified: Secondary | ICD-10-CM | POA: Insufficient documentation

## 2024-02-01 DIAGNOSIS — E876 Hypokalemia: Secondary | ICD-10-CM | POA: Insufficient documentation

## 2024-02-01 DIAGNOSIS — Z6827 Body mass index (BMI) 27.0-27.9, adult: Secondary | ICD-10-CM | POA: Insufficient documentation

## 2024-02-01 NOTE — Progress Notes (Signed)
 Surgical Date: 01/20/2024 Procedure: EXCISION MASS, BACK, RIGHT SHOULDER <4 cm  Patient seen in office for post op wound check and suture removal of excision of mass to right upper shoulder on her back.   Surgical pathology discussed and patient aware that mass is epidermal inclusion cyst.   7 sutures removed from shoulder. Steri Strips applied. Patient tolerated procedure well.   Of note, slight seroma noted to lateral edge of incision. Slight yellow colored drainage noted. No redness to surrounding skin or warmth to touch noted. Advised to monitor drainage for changes.   Advised to contact office as needed for questions or concerns.

## 2024-02-02 ENCOUNTER — Encounter: Admitting: Surgery
